# Patient Record
Sex: Female | Born: 1980 | Race: White | Hispanic: No | Marital: Married | Smoking: Current some day smoker
Health system: Southern US, Community
[De-identification: ages and names within clinical notes are randomized; demographics above are authoritative.]

## PROBLEM LIST (undated history)

## (undated) DIAGNOSIS — K029 Dental caries, unspecified: Secondary | ICD-10-CM

## (undated) DIAGNOSIS — C801 Malignant (primary) neoplasm, unspecified: Secondary | ICD-10-CM

## (undated) DIAGNOSIS — C8190 Hodgkin lymphoma, unspecified, unspecified site: Secondary | ICD-10-CM

## (undated) DIAGNOSIS — J302 Other seasonal allergic rhinitis: Secondary | ICD-10-CM

## (undated) DIAGNOSIS — E119 Type 2 diabetes mellitus without complications: Secondary | ICD-10-CM

## (undated) DIAGNOSIS — J45909 Unspecified asthma, uncomplicated: Secondary | ICD-10-CM

## (undated) DIAGNOSIS — I10 Essential (primary) hypertension: Secondary | ICD-10-CM

## (undated) DIAGNOSIS — I160 Hypertensive urgency: Secondary | ICD-10-CM

## (undated) DIAGNOSIS — K219 Gastro-esophageal reflux disease without esophagitis: Secondary | ICD-10-CM

## (undated) DIAGNOSIS — J189 Pneumonia, unspecified organism: Secondary | ICD-10-CM

## (undated) DIAGNOSIS — R51 Headache: Secondary | ICD-10-CM

## (undated) DIAGNOSIS — F329 Major depressive disorder, single episode, unspecified: Secondary | ICD-10-CM

## (undated) DIAGNOSIS — R519 Headache, unspecified: Secondary | ICD-10-CM

## (undated) DIAGNOSIS — F32A Depression, unspecified: Secondary | ICD-10-CM

## (undated) HISTORY — PX: CHOLECYSTECTOMY: SHX55

## (undated) HISTORY — PX: PORTACATH PLACEMENT: SHX2246

## (undated) HISTORY — DX: Hodgkin lymphoma, unspecified, unspecified site: C81.90

## (undated) HISTORY — PX: LYMPH NODE BIOPSY: SHX201

## (undated) HISTORY — PX: PORT-A-CATH REMOVAL: SHX5289

---
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
## 2001-02-14 ENCOUNTER — Encounter: Admission: RE | Admit: 2001-02-14 | Discharge: 2001-02-14 | Payer: Self-pay | Admitting: Obstetrics & Gynecology

## 2001-03-21 ENCOUNTER — Encounter: Admission: RE | Admit: 2001-03-21 | Discharge: 2001-03-21 | Payer: Self-pay | Admitting: *Deleted

## 2001-06-14 ENCOUNTER — Inpatient Hospital Stay (HOSPITAL_COMMUNITY): Admission: AD | Admit: 2001-06-14 | Discharge: 2001-06-14 | Payer: Self-pay | Admitting: *Deleted

## 2002-01-11 ENCOUNTER — Encounter: Payer: Self-pay | Admitting: Emergency Medicine

## 2002-01-11 ENCOUNTER — Emergency Department (HOSPITAL_COMMUNITY): Admission: EM | Admit: 2002-01-11 | Discharge: 2002-01-11 | Payer: Self-pay | Admitting: Emergency Medicine

## 2002-09-17 ENCOUNTER — Emergency Department (HOSPITAL_COMMUNITY): Admission: EM | Admit: 2002-09-17 | Discharge: 2002-09-17 | Payer: Self-pay | Admitting: Emergency Medicine

## 2002-10-18 ENCOUNTER — Encounter: Admission: RE | Admit: 2002-10-18 | Discharge: 2002-10-18 | Payer: Self-pay | Admitting: Obstetrics and Gynecology

## 2002-11-08 ENCOUNTER — Encounter: Admission: RE | Admit: 2002-11-08 | Discharge: 2002-11-08 | Payer: Self-pay | Admitting: Obstetrics and Gynecology

## 2008-12-02 ENCOUNTER — Emergency Department: Payer: Self-pay | Admitting: Emergency Medicine

## 2012-09-28 ENCOUNTER — Emergency Department: Payer: Self-pay | Admitting: Emergency Medicine

## 2012-10-03 DIAGNOSIS — C8190 Hodgkin lymphoma, unspecified, unspecified site: Secondary | ICD-10-CM | POA: Insufficient documentation

## 2012-11-12 ENCOUNTER — Emergency Department: Payer: Self-pay | Admitting: Emergency Medicine

## 2012-11-12 LAB — CBC WITH DIFFERENTIAL/PLATELET
Basophil %: 0.6 %
Eosinophil %: 0.7 %
Lymphocyte #: 2.7 10*3/uL (ref 1.0–3.6)
Monocyte #: 0.8 x10 3/mm (ref 0.2–0.9)
Neutrophil #: 6.7 10*3/uL — ABNORMAL HIGH (ref 1.4–6.5)
RBC: 4.85 10*6/uL (ref 3.80–5.20)
RDW: 13.4 % (ref 11.5–14.5)
WBC: 10.3 10*3/uL (ref 3.6–11.0)

## 2012-11-12 LAB — DRUG SCREEN, URINE
Amphetamines, Ur Screen: NEGATIVE (ref ?–1000)
Cannabinoid 50 Ng, Ur ~~LOC~~: NEGATIVE (ref ?–50)
Cocaine Metabolite,Ur ~~LOC~~: NEGATIVE (ref ?–300)
MDMA (Ecstasy)Ur Screen: NEGATIVE (ref ?–500)
Opiate, Ur Screen: NEGATIVE (ref ?–300)
Phencyclidine (PCP) Ur S: NEGATIVE (ref ?–25)

## 2012-11-12 LAB — CK TOTAL AND CKMB (NOT AT ARMC): CK, Total: 92 U/L (ref 21–215)

## 2012-11-12 LAB — COMPREHENSIVE METABOLIC PANEL
Anion Gap: 6 — ABNORMAL LOW (ref 7–16)
Bilirubin,Total: 0.4 mg/dL (ref 0.2–1.0)
EGFR (Non-African Amer.): >60
Glucose: 123 mg/dL — ABNORMAL HIGH (ref 65–99)
Osmolality: 280 (ref 275–301)
Potassium: 3.8 mmol/L (ref 3.5–5.1)

## 2012-11-12 LAB — URINALYSIS, COMPLETE
Bilirubin,UR: NEGATIVE
Glucose,UR: NEGATIVE mg/dL (ref 0–75)
Leukocyte Esterase: NEGATIVE
Nitrite: NEGATIVE
RBC,UR: 1 /HPF (ref 0–5)
Specific Gravity: 1.01 (ref 1.003–1.030)
Squamous Epithelial: 2
WBC UR: <1 /HPF (ref 0–5)

## 2012-11-12 LAB — PRO B NATRIURETIC PEPTIDE: B-Type Natriuretic Peptide: 791 pg/mL — ABNORMAL HIGH (ref 0–125)

## 2012-11-12 LAB — TROPONIN I: Troponin-I: <0.02 ng/mL

## 2013-01-10 ENCOUNTER — Emergency Department (HOSPITAL_COMMUNITY)
Admission: EM | Admit: 2013-01-10 | Discharge: 2013-01-10 | Discharge status: Against Medical Advice | Payer: Medicaid - Out of State | Attending: Emergency Medicine | Admitting: Emergency Medicine

## 2013-01-10 ENCOUNTER — Encounter (HOSPITAL_COMMUNITY): Payer: Self-pay | Admitting: Emergency Medicine

## 2013-01-10 DIAGNOSIS — R197 Diarrhea, unspecified: Secondary | ICD-10-CM | POA: Insufficient documentation

## 2013-01-10 DIAGNOSIS — I10 Essential (primary) hypertension: Secondary | ICD-10-CM | POA: Insufficient documentation

## 2013-01-10 DIAGNOSIS — R1110 Vomiting, unspecified: Secondary | ICD-10-CM | POA: Insufficient documentation

## 2013-01-10 DIAGNOSIS — Z859 Personal history of malignant neoplasm, unspecified: Secondary | ICD-10-CM | POA: Insufficient documentation

## 2013-01-10 DIAGNOSIS — F17200 Nicotine dependence, unspecified, uncomplicated: Secondary | ICD-10-CM | POA: Insufficient documentation

## 2013-01-10 DIAGNOSIS — E119 Type 2 diabetes mellitus without complications: Secondary | ICD-10-CM | POA: Insufficient documentation

## 2013-01-10 HISTORY — DX: Essential (primary) hypertension: I10

## 2013-01-10 HISTORY — DX: Type 2 diabetes mellitus without complications: E11.9

## 2013-01-10 HISTORY — DX: Malignant (primary) neoplasm, unspecified: C80.1

## 2013-01-10 NOTE — ED Notes (Signed)
Pt took off arm band states waited to long left

## 2013-01-10 NOTE — ED Notes (Signed)
Pt c/o N/V/D starting this am

## 2013-03-14 ENCOUNTER — Ambulatory Visit: Payer: Medicaid - Out of State

## 2013-03-20 ENCOUNTER — Ambulatory Visit: Payer: Medicaid - Out of State

## 2013-04-23 ENCOUNTER — Encounter (HOSPITAL_COMMUNITY): Payer: Self-pay

## 2013-04-24 ENCOUNTER — Ambulatory Visit (HOSPITAL_COMMUNITY): Payer: Self-pay

## 2013-04-26 DIAGNOSIS — E669 Obesity, unspecified: Secondary | ICD-10-CM | POA: Insufficient documentation

## 2013-12-31 ENCOUNTER — Emergency Department (HOSPITAL_COMMUNITY)
Admission: EM | Admit: 2013-12-31 | Discharge: 2013-12-31 | Disposition: A | Discharge status: Home / Self Care | Payer: Medicaid Other | Attending: Emergency Medicine | Admitting: Emergency Medicine

## 2013-12-31 ENCOUNTER — Encounter (HOSPITAL_COMMUNITY): Payer: Self-pay | Admitting: Emergency Medicine

## 2013-12-31 DIAGNOSIS — Z79899 Other long term (current) drug therapy: Secondary | ICD-10-CM | POA: Diagnosis not present

## 2013-12-31 DIAGNOSIS — Y998 Other external cause status: Secondary | ICD-10-CM | POA: Diagnosis not present

## 2013-12-31 DIAGNOSIS — Y92410 Unspecified street and highway as the place of occurrence of the external cause: Secondary | ICD-10-CM | POA: Diagnosis not present

## 2013-12-31 DIAGNOSIS — E119 Type 2 diabetes mellitus without complications: Secondary | ICD-10-CM | POA: Insufficient documentation

## 2013-12-31 DIAGNOSIS — S199XXA Unspecified injury of neck, initial encounter: Secondary | ICD-10-CM | POA: Insufficient documentation

## 2013-12-31 DIAGNOSIS — M542 Cervicalgia: Secondary | ICD-10-CM

## 2013-12-31 DIAGNOSIS — Z7982 Long term (current) use of aspirin: Secondary | ICD-10-CM | POA: Insufficient documentation

## 2013-12-31 DIAGNOSIS — Z91040 Latex allergy status: Secondary | ICD-10-CM | POA: Insufficient documentation

## 2013-12-31 DIAGNOSIS — I10 Essential (primary) hypertension: Secondary | ICD-10-CM | POA: Diagnosis not present

## 2013-12-31 DIAGNOSIS — Y93K1 Activity, walking an animal: Secondary | ICD-10-CM | POA: Diagnosis not present

## 2013-12-31 DIAGNOSIS — Z720 Tobacco use: Secondary | ICD-10-CM | POA: Insufficient documentation

## 2013-12-31 DIAGNOSIS — Z8572 Personal history of non-Hodgkin lymphomas: Secondary | ICD-10-CM | POA: Diagnosis not present

## 2013-12-31 DIAGNOSIS — S4992XA Unspecified injury of left shoulder and upper arm, initial encounter: Secondary | ICD-10-CM | POA: Insufficient documentation

## 2013-12-31 DIAGNOSIS — M25512 Pain in left shoulder: Secondary | ICD-10-CM

## 2013-12-31 MED ORDER — METHOCARBAMOL 500 MG PO TABS: 1000.0000 mg | ORAL_TABLET | Freq: Four times a day (QID) | ORAL | Status: DC | PRN
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
## 2014-08-26 ENCOUNTER — Encounter (HOSPITAL_COMMUNITY): Payer: Self-pay | Admitting: Emergency Medicine

## 2014-08-26 DIAGNOSIS — R1031 Right lower quadrant pain: Secondary | ICD-10-CM | POA: Insufficient documentation

## 2014-08-26 DIAGNOSIS — Z91040 Latex allergy status: Secondary | ICD-10-CM | POA: Insufficient documentation

## 2014-08-26 DIAGNOSIS — R110 Nausea: Secondary | ICD-10-CM | POA: Insufficient documentation

## 2014-08-26 DIAGNOSIS — Z79899 Other long term (current) drug therapy: Secondary | ICD-10-CM | POA: Insufficient documentation

## 2014-08-26 DIAGNOSIS — Z720 Tobacco use: Secondary | ICD-10-CM | POA: Insufficient documentation

## 2014-08-26 DIAGNOSIS — Z3202 Encounter for pregnancy test, result negative: Secondary | ICD-10-CM | POA: Insufficient documentation

## 2014-08-26 DIAGNOSIS — E119 Type 2 diabetes mellitus without complications: Secondary | ICD-10-CM | POA: Insufficient documentation

## 2014-08-26 DIAGNOSIS — R197 Diarrhea, unspecified: Secondary | ICD-10-CM | POA: Insufficient documentation

## 2014-08-26 DIAGNOSIS — I10 Essential (primary) hypertension: Secondary | ICD-10-CM | POA: Insufficient documentation

## 2014-08-26 DIAGNOSIS — Z8589 Personal history of malignant neoplasm of other organs and systems: Secondary | ICD-10-CM | POA: Insufficient documentation

## 2014-08-26 NOTE — ED Notes (Signed)
Pt. reports RLQ pain radiating to right lower back , nausea and diarrhea onset today , denies emesis / no fever or chills.

## 2014-08-27 ENCOUNTER — Encounter (HOSPITAL_COMMUNITY): Payer: Self-pay

## 2014-08-27 ENCOUNTER — Emergency Department (HOSPITAL_COMMUNITY)
Admission: EM | Admit: 2014-08-27 | Discharge: 2014-08-27 | Disposition: A | Discharge status: Home / Self Care | Payer: Self-pay | Attending: Emergency Medicine | Admitting: Emergency Medicine

## 2014-08-27 ENCOUNTER — Emergency Department (HOSPITAL_COMMUNITY): Payer: Medicaid Other

## 2014-08-27 DIAGNOSIS — R1031 Right lower quadrant pain: Secondary | ICD-10-CM

## 2014-08-27 LAB — URINALYSIS, ROUTINE W REFLEX MICROSCOPIC
Glucose, UA: 500 mg/dL — AB
Ketones, ur: 15 mg/dL — AB
Leukocytes, UA: NEGATIVE
NITRITE: NEGATIVE
PROTEIN: 100 mg/dL — AB
Specific Gravity, Urine: 1.031 — ABNORMAL HIGH (ref 1.005–1.030)
Urobilinogen, UA: 1 mg/dL (ref 0.0–1.0)
pH: 6 (ref 5.0–8.0)

## 2014-08-27 LAB — WET PREP, GENITAL
CLUE CELLS WET PREP: NONE SEEN
TRICH WET PREP: NONE SEEN
Yeast Wet Prep HPF POC: NONE SEEN

## 2014-08-27 LAB — COMPREHENSIVE METABOLIC PANEL
ALK PHOS: 52 U/L (ref 38–126)
ALT: 17 U/L (ref 14–54)
AST: 14 U/L — AB (ref 15–41)
Albumin: 3.9 g/dL (ref 3.5–5.0)
Anion gap: 7 (ref 5–15)
BUN: 8 mg/dL (ref 6–20)
CO2: 26 mmol/L (ref 22–32)
CREATININE: 0.62 mg/dL (ref 0.44–1.00)
Calcium: 9.4 mg/dL (ref 8.9–10.3)
Chloride: 103 mmol/L (ref 101–111)
GFR calc Af Amer: >60 mL/min (ref >60)
GFR calc non Af Amer: >60 mL/min (ref >60)
Glucose, Bld: 210 mg/dL — ABNORMAL HIGH (ref 65–99)
POTASSIUM: 3.9 mmol/L (ref 3.5–5.1)
SODIUM: 136 mmol/L (ref 135–145)
Total Bilirubin: 0.7 mg/dL (ref 0.3–1.2)
Total Protein: 6.9 g/dL (ref 6.5–8.1)

## 2014-08-27 LAB — CBC
HCT: 46.4 % — ABNORMAL HIGH (ref 36.0–46.0)
HEMOGLOBIN: 16.3 g/dL — AB (ref 12.0–15.0)
MCH: 32.5 pg (ref 26.0–34.0)
MCHC: 35.1 g/dL (ref 30.0–36.0)
MCV: 92.6 fL (ref 78.0–100.0)
Platelets: 255 10*3/uL (ref 150–400)
RBC: 5.01 MIL/uL (ref 3.87–5.11)
RDW: 13.5 % (ref 11.5–15.5)
WBC: 15.6 10*3/uL — ABNORMAL HIGH (ref 4.0–10.5)

## 2014-08-27 LAB — POC URINE PREG, ED: PREG TEST UR: NEGATIVE

## 2014-08-27 LAB — GC/CHLAMYDIA PROBE AMP (~~LOC~~) NOT AT ARMC
CHLAMYDIA, DNA PROBE: NEGATIVE
Neisseria Gonorrhea: NEGATIVE

## 2014-08-27 LAB — URINE MICROSCOPIC-ADD ON

## 2014-08-27 LAB — LIPASE, BLOOD: LIPASE: 16 U/L — AB (ref 22–51)

## 2014-08-27 MED ORDER — ONDANSETRON HCL 4 MG/2ML IJ SOLN
4.0000 mg | Freq: Once | INTRAMUSCULAR | Status: AC
  Administered 2014-08-27: 4 mg via INTRAVENOUS
  Filled 2014-08-27: qty 2

## 2014-08-27 MED ORDER — IOHEXOL 300 MG/ML  SOLN
80.0000 mL | Freq: Once | INTRAMUSCULAR | Status: AC | PRN
  Administered 2014-08-27: 80 mL via INTRAVENOUS

## 2014-08-27 MED ORDER — ONDANSETRON 8 MG PO TBDP: 8.0000 mg | ORAL_TABLET | Freq: Three times a day (TID) | ORAL | Status: DC | PRN

## 2014-08-27 MED ORDER — HYDROCODONE-ACETAMINOPHEN 5-325 MG PO TABS: 1.0000 | ORAL_TABLET | ORAL | Status: DC | PRN

## 2014-08-27 MED ORDER — DICYCLOMINE HCL 20 MG PO TABS: 20.0000 mg | ORAL_TABLET | Freq: Four times a day (QID) | ORAL | Status: DC | PRN

## 2014-08-27 MED ORDER — MORPHINE SULFATE 4 MG/ML IJ SOLN
4.0000 mg | Freq: Once | INTRAMUSCULAR | Status: AC
  Administered 2014-08-27: 4 mg via INTRAVENOUS
  Filled 2014-08-27: qty 1

## 2014-08-27 MED ORDER — IOHEXOL 300 MG/ML  SOLN
25.0000 mL | Freq: Once | INTRAMUSCULAR | Status: AC | PRN
  Administered 2014-08-27: 25 mL via INTRAVENOUS

## 2014-08-27 NOTE — ED Provider Notes (Signed)
CSN: [id number]     Arrival date & time 08/26/14  2338 History  This chart was scribed for Linton Flemings, MD by Hansel Feinstein, ED Scribe. This patient was seen in room B19C/B19C and the patient's care was started at 12:30 AM.     Chief Complaint  Patient presents with  . Abdominal Pain   The history is provided by the patient. No language interpreter was used.    HPI Comments: Bridget Scott is a 34 y.o. 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 She states associated nausea and diarrhea. LNMP was 2 months ago. 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 She denies Hx of renal calculi, sick contact. She also denies vomiting, dysuria, vaginal discharge.  Past Medical History  Diagnosis Date  . Hypertension   . Diabetes mellitus without complication   . Cancer     lymphoma   No past surgical history on file. No family history on file. History  Substance Use Topics  . Smoking status: Current Every Day Smoker  . Smokeless tobacco: Not on file  . Alcohol Use: No   OB History    No data available     Review of Systems  Gastrointestinal: Positive for nausea, abdominal pain and diarrhea. Negative for vomiting.  Genitourinary: Negative for dysuria and vaginal discharge.  All other systems reviewed and are negative.   Allergies  Latex and Robitussin (alcohol free)  Home Medications   Prior to Admission medications   Medication Sig Start Date End Date Taking? Authorizing Provider  aspirin-acetaminophen-caffeine (EXCEDRIN MIGRAINE) [phone] MG per tablet Take 1 tablet by mouth every 6 (six) hours as needed for headache.    Historical Provider, MD  benazepril (LOTENSIN) 10 MG tablet Take 10 mg by mouth daily.    Historical Provider, MD  metFORMIN (GLUCOPHAGE) 500 MG tablet Take 500  mg by mouth 3 (three) times daily.    Historical Provider, MD  methocarbamol (ROBAXIN) 500 MG tablet Take 2 tablets (1,000 mg total) by mouth 4 (four) times daily as needed (Pain). 12/31/13   Nicole Pisciotta, [name]   BP 194/107 mmHg  Pulse 89  Temp(Src) 98.1 F (36.7 C) (Oral)  Resp 18  Ht 5\' 5"  (1.651 m)  Wt 214 lb 7 oz (97.268 kg)  BMI 35.68 kg/m2  SpO2 98%  LMP  Physical Exam  Constitutional: She is oriented to person, place, and time. She appears well-developed and well-nourished.  HENT:  Head: Normocephalic and atraumatic.  Nose: Nose normal.  Mouth/Throat: Oropharynx is clear and moist.  Eyes: Conjunctivae and EOM are normal. Pupils are equal, round, and reactive to light.  Neck: Normal range of motion. Neck supple. No JVD present. No tracheal deviation present. No thyromegaly present.  Cardiovascular: Normal rate, regular rhythm, normal heart sounds and intact distal pulses.  Exam reveals no gallop and no friction rub.   No murmur heard. Pulmonary/Chest: Effort normal and breath sounds normal. No stridor. No respiratory distress. She has no wheezes. She has no rales. She exhibits no tenderness.  Abdominal: Soft. Bowel sounds are normal. She exhibits no distension and no mass. There is tenderness (Tenderness with palpation in right lower quadrant without rebound or guarding). There is no rebound and no guarding.  Genitourinary:  External genitalia within normal limits Vagina without discharge Cervix  normal negative for cervical  motion tenderness Adnexa palpated, no masses or negative for tenderness noted Bladder palpated negative for tenderness Uterus palpated no masses or negative for tenderness    Musculoskeletal: Normal range of motion. She exhibits no edema or tenderness.  Lymphadenopathy:    She has no cervical adenopathy.  Neurological: She is alert and oriented to person, place, and time. She displays normal reflexes. She exhibits normal muscle tone. Coordination  normal.  Skin: Skin is warm and dry. No rash noted. No erythema. No pallor.  Psychiatric: She has a normal mood and affect. Her behavior is normal. Judgment and thought content normal.  Nursing note and vitals reviewed.   ED Course  Procedures (including critical care time) DIAGNOSTIC STUDIES: Oxygen Saturation is 98% on RA, normal by my interpretation.    COORDINATION OF CARE: 12:35 AM Discussed treatment plan with pt at bedside and pt agreed to plan.   Labs Review Labs Reviewed  WET PREP, GENITAL - Abnormal; Notable for the following:    WBC, Wet Prep HPF POC FEW (*)    All other components within normal limits  LIPASE, BLOOD - Abnormal; Notable for the following:    Lipase 16 (*)    All other components within normal limits  COMPREHENSIVE METABOLIC PANEL - Abnormal; Notable for the following:    Glucose, Bld 210 (*)    AST 14 (*)    All other components within normal limits  CBC - Abnormal; Notable for the following:    WBC 15.6 (*)    Hemoglobin 16.3 (*)    HCT 46.4 (*)    All other components within normal limits  URINALYSIS, ROUTINE W REFLEX MICROSCOPIC (NOT AT [facility]) - Abnormal; Notable for the following:    Color, Urine AMBER (*)    APPearance CLOUDY (*)    Specific Gravity, Urine 1.031 (*)    Glucose, UA 500 (*)    Hgb urine dipstick TRACE (*)    Bilirubin Urine SMALL (*)    Ketones, ur 15 (*)    Protein, ur 100 (*)    All other components within normal limits  URINE MICROSCOPIC-ADD ON - Abnormal; Notable for the following:    Squamous Epithelial / LPF MANY (*)    Bacteria, UA MANY (*)    All other components within normal limits  POC URINE PREG, ED  GC/CHLAMYDIA PROBE AMP ([place]) NOT AT [facility]    Imaging Review Ct Abdomen Pelvis W Contrast  08/27/2014   CLINICAL DATA:  Acute onset of right lower quadrant abdominal pain. Current history of Hodgkin's lymphoma. Initial encounter.  EXAM: CT ABDOMEN AND PELVIS WITH CONTRAST  TECHNIQUE: Multidetector CT  imaging of the abdomen and pelvis was performed using the standard protocol following bolus administration of intravenous contrast.  CONTRAST:  55mL OMNIPAQUE IOHEXOL 300 MG/ML  SOLN  COMPARISON:  None.  FINDINGS: The visualized lung bases are clear.  The liver and spleen are unremarkable in appearance. The patient is status post cholecystectomy, with clips noted at the gallbladder fossa. The pancreas and adrenal glands are unremarkable.  The kidneys are unremarkable in appearance. There is no evidence of hydronephrosis. No renal or ureteral stones are seen. No perinephric stranding is appreciated.  No free fluid is identified. The small bowel is unremarkable in appearance. The stomach is within normal limits. No acute vascular abnormalities are seen.  The appendix is normal in caliber, without evidence of appendicitis. The colon is unremarkable in appearance.  The bladder is mildly distended and grossly unremarkable. The uterus  is within normal limits. The ovaries are grossly symmetric. No suspicious adnexal masses are seen. No inguinal lymphadenopathy is seen.  No acute osseous abnormalities are identified.  IMPRESSION: No acute abnormality seen within the abdomen or pelvis.   Electronically Signed   By: Garald Balding M.D.   On: 08/27/2014 02:38     EKG Interpretation None      MDM   Final diagnoses:  Right lower quadrant abdominal pain   I personally performed the services described in this documentation, which was scribed in my presence. The recorded information has been reviewed and is accurate.  34 year old female with right lower quadrant pain today.  Workup here without specific findings.  Pelvic exam normal.  CT scan without appendicitis.  No signs of urinary tract infection.  She has leukocytosis without focal infection.  Patient told to stick to bland diet and return for worsening symptoms.  Linton Flemings, MD 08/27/14 [phone]

## 2014-08-27 NOTE — Discharge Instructions (Signed)
Your CAT scan today did not show a specific cause for your pain.  Stick to a bland diet.  Return to the emergency department for worsening condition or new concerning symptoms.  Take medications as prescribed.  Follow-up with your Dr. For recheck this week.    Abdominal Pain Many things can cause abdominal pain. Usually, abdominal pain is not caused by a disease and will improve without treatment. It can often be observed and treated at home. Your health care provider will do a physical exam and possibly order blood tests and X-rays to help determine the seriousness of your pain. However, in many cases, more time must pass before a clear cause of the pain can be found. Before that point, your health care provider may not know if you need more testing or further treatment. HOME CARE INSTRUCTIONS  Monitor your abdominal pain for any changes. The following actions may help to alleviate any discomfort you are experiencing:  Only take over-the-counter or prescription medicines as directed by your health care provider.  Do not take laxatives unless directed to do so by your health care provider.  Try a clear liquid diet (broth, tea, or water) as directed by your health care provider. Slowly move to a bland diet as tolerated. SEEK MEDICAL CARE IF:  You have unexplained abdominal pain.  You have abdominal pain associated with nausea or diarrhea.  You have pain when you urinate or have a bowel movement.  You experience abdominal pain that wakes you in the night.  You have abdominal pain that is worsened or improved by eating food.  You have abdominal pain that is worsened with eating fatty foods.  You have a fever. SEEK IMMEDIATE MEDICAL CARE IF:   Your pain does not go away within 2 hours.  You keep throwing up (vomiting).  Your pain is felt only in portions of the abdomen, such as the right side or the left lower portion of the abdomen.  You pass bloody or black tarry stools. MAKE  SURE YOU:  Understand these instructions.   Will watch your condition.   Will get help right away if you are not doing well or get worse.  Document Released: 10/21/2004 Document Revised: 01/16/2013 Document Reviewed: 09/20/2012 [facility] Patient Information 2015 [place], [place]. This information is not intended to replace advice given to you by your health care provider. Make sure you discuss any questions you have with your health care provider.

## 2015-02-21 ENCOUNTER — Emergency Department (HOSPITAL_COMMUNITY)
Admission: EM | Admit: 2015-02-21 | Discharge: 2015-02-21 | Disposition: A | Discharge status: Home / Self Care | Payer: Medicaid Other | Attending: Emergency Medicine | Admitting: Emergency Medicine

## 2015-02-21 ENCOUNTER — Encounter (HOSPITAL_COMMUNITY): Payer: Self-pay | Admitting: Family Medicine

## 2015-02-21 DIAGNOSIS — I10 Essential (primary) hypertension: Secondary | ICD-10-CM | POA: Insufficient documentation

## 2015-02-21 DIAGNOSIS — Z7984 Long term (current) use of oral hypoglycemic drugs: Secondary | ICD-10-CM | POA: Diagnosis not present

## 2015-02-21 DIAGNOSIS — K0889 Other specified disorders of teeth and supporting structures: Secondary | ICD-10-CM | POA: Diagnosis present

## 2015-02-21 DIAGNOSIS — Z91040 Latex allergy status: Secondary | ICD-10-CM | POA: Diagnosis not present

## 2015-02-21 DIAGNOSIS — F17200 Nicotine dependence, unspecified, uncomplicated: Secondary | ICD-10-CM | POA: Insufficient documentation

## 2015-02-21 DIAGNOSIS — Z8572 Personal history of non-Hodgkin lymphomas: Secondary | ICD-10-CM | POA: Diagnosis not present

## 2015-02-21 DIAGNOSIS — G8929 Other chronic pain: Secondary | ICD-10-CM | POA: Insufficient documentation

## 2015-02-21 DIAGNOSIS — K002 Abnormalities of size and form of teeth: Secondary | ICD-10-CM | POA: Insufficient documentation

## 2015-02-21 DIAGNOSIS — E119 Type 2 diabetes mellitus without complications: Secondary | ICD-10-CM | POA: Insufficient documentation

## 2015-02-21 DIAGNOSIS — K029 Dental caries, unspecified: Secondary | ICD-10-CM | POA: Insufficient documentation

## 2015-02-21 DIAGNOSIS — Z792 Long term (current) use of antibiotics: Secondary | ICD-10-CM | POA: Insufficient documentation

## 2015-02-21 DIAGNOSIS — Z79899 Other long term (current) drug therapy: Secondary | ICD-10-CM | POA: Diagnosis not present

## 2015-02-21 DIAGNOSIS — K089 Disorder of teeth and supporting structures, unspecified: Secondary | ICD-10-CM

## 2015-02-21 MED ORDER — BUPIVACAINE HCL (PF) 0.5 % IJ SOLN
10.0000 mL | Freq: Once | INTRAMUSCULAR | Status: AC
  Administered 2015-02-21: 10 mL
  Filled 2015-02-21: qty 10

## 2015-02-21 MED ORDER — IBUPROFEN 600 MG PO TABS: 600.0000 mg | ORAL_TABLET | Freq: Four times a day (QID) | ORAL | Status: DC | PRN

## 2015-02-21 NOTE — ED Notes (Addendum)
Pt here for multiple broken top teeth and infection. Pt currently taking abx. sts worsening.

## 2015-02-21 NOTE — Discharge Instructions (Signed)
Dental Care and Dentist Visits °Dental care supports good overall health. Regular dental visits can also help you avoid dental pain, bleeding, infection, and other more serious health problems in the future. It is important to keep the mouth healthy because diseases in the teeth, gums, and other oral tissues can spread to other areas of the body. Some problems, such as diabetes, heart disease, and pre-term labor have been associated with poor oral health.  °See your dentist every 6 months. If you experience emergency problems such as a toothache or broken tooth, go to the dentist right away. If you see your dentist regularly, you may catch problems early. It is easier to be treated for problems in the early stages.  °WHAT TO EXPECT AT A DENTIST VISIT  °Your dentist will look for many common oral health problems and recommend proper treatment. At your regular dental visit, you can expect: °· Gentle cleaning of the teeth and gums. This includes scraping and polishing. This helps to remove the sticky substance around the teeth and gums (plaque). Plaque forms in the mouth shortly after eating. Over time, plaque hardens on the teeth as tartar. If tartar is not removed regularly, it can cause problems. Cleaning also helps remove stains. °· Periodic X-rays. These pictures of the teeth and supporting bone will help your dentist assess the health of your teeth. °· Periodic fluoride treatments. Fluoride is a natural mineral shown to help strengthen teeth. Fluoride treatment involves applying a fluoride gel or varnish to the teeth. It is most commonly done in children. °· Examination of the mouth, tongue, jaws, teeth, and gums to look for any oral health problems, such as: °¨ Cavities (dental caries). This is decay on the tooth caused by plaque, sugar, and acid in the mouth. It is best to catch a cavity when it is small. °¨ Inflammation of the gums caused by plaque buildup (gingivitis). °¨ Problems with the mouth or malformed  or misaligned teeth. °¨ Oral cancer or other diseases of the soft tissues or jaws.  °KEEP YOUR TEETH AND GUMS HEALTHY °For healthy teeth and gums, follow these general guidelines as well as your dentist's specific advice: °· Have your teeth professionally cleaned at the dentist every 6 months. °· Brush twice daily with a fluoride toothpaste. °· Floss your teeth daily.  °· Ask your dentist if you need fluoride supplements, treatments, or fluoride toothpaste. °· Eat a healthy diet. Reduce foods and drinks with added sugar. °· Avoid smoking. °TREATMENT FOR ORAL HEALTH PROBLEMS °If you have oral health problems, treatment varies depending on the conditions present in your teeth and gums. °· Your caregiver will most likely recommend good oral hygiene at each visit. °· For cavities, gingivitis, or other oral health disease, your caregiver will perform a procedure to treat the problem. This is typically done at a separate appointment. Sometimes your caregiver will refer you to another dental specialist for specific tooth problems or for surgery. °SEEK IMMEDIATE DENTAL CARE IF: °· You have pain, bleeding, or soreness in the gum, tooth, jaw, or mouth area. °· A permanent tooth becomes loose or separated from the gum socket. °· You experience a blow or injury to the mouth or jaw area. °  °This information is not intended to replace advice given to you by your health care provider. Make sure you discuss any questions you have with your health care provider. °  °Document Released: 09/23/2010 Document Revised: 04/05/2011 Document Reviewed: 09/23/2010 °Elsevier Interactive Patient Education ©2016 [employer] ° °

## 2015-02-21 NOTE — ED Provider Notes (Signed)
CSN: [id number]     Arrival date & time 02/21/15  1842 History   First MD Initiated Contact with Patient 02/21/15 2039     Chief Complaint  Patient presents with  . Dental Pain     (Consider location/radiation/quality/duration/timing/severity/associated sxs/prior Treatment) Patient is a 35 y.o. female presenting with tooth pain. The history is provided by the patient.  Dental Pain Location:  Generalized (worse at front left upper teeth) Quality:  Shooting and throbbing Severity:  Moderate Onset quality:  Gradual Duration: months but worse over the past 5 days. Timing:  Constant Progression:  Worsening Chronicity:  Chronic Context: dental caries and poor dentition   Relieved by:  Nothing Worsened by:  Touching Ineffective treatments:  None tried Associated symptoms: facial pain   Associated symptoms: no difficulty swallowing, no drooling, no facial swelling, no fever, no gum swelling, no headaches and no neck swelling   Risk factors: lack of dental care and smoking     Past Medical History  Diagnosis Date  . Hypertension   . Diabetes mellitus without complication ([place])   . Cancer [facility])     lymphoma   History reviewed. No pertinent past surgical history. History reviewed. No pertinent family history. Social History  Substance Use Topics  . Smoking status: Current Every Day Smoker  . Smokeless tobacco: None  . Alcohol Use: No   OB History    No data available     Review of Systems  Constitutional: Negative for fever.  HENT: Negative for drooling and facial swelling.   Eyes: Negative for redness and visual disturbance.  Respiratory: Negative for cough, shortness of breath and wheezing.   Cardiovascular: Negative.   Gastrointestinal: Negative.   Genitourinary: Negative.   Musculoskeletal: Negative.   Skin: Negative.   Neurological: Negative for headaches.      Allergies  Latex and Robitussin (alcohol free)  Home Medications   Prior to Admission  medications   Medication Sig Start Date End Date Taking? Authorizing Provider  amoxicillin (AMOXIL) 500 MG capsule Take 500 mg by mouth 3 (three) times daily.   Yes Historical Provider, MD  metFORMIN (GLUCOPHAGE) 500 MG tablet Take 500 mg by mouth 3 (three) times daily as needed (Take 500mg  with breakfast, 1000mg  with lunch, and 500mg  with dinner).   Yes Historical Provider, MD  methyldopa (ALDOMET) 500 MG tablet Take 500 mg by mouth 2 (two) times daily. 01/01/15 01/01/16 Yes Historical Provider, MD  traMADol (ULTRAM) 50 MG tablet Take 50 mg by mouth every 6 (six) hours as needed. For pain 01/01/15 01/01/16 Yes Historical Provider, MD  traZODone (DESYREL) 50 MG tablet Take 50 mg by mouth at bedtime. 01/01/15  Yes Historical Provider, MD  dicyclomine (BENTYL) 20 MG tablet Take 1 tablet (20 mg total) by mouth every 6 (six) hours as needed for spasms (for abdominal cramping). 08/27/14   Linton Flemings, MD  HYDROcodone-acetaminophen (NORCO/VICODIN) 5-325 MG per tablet Take 1-2 tablets by mouth every 4 (four) hours as needed. 08/27/14   Linton Flemings, MD  ibuprofen (ADVIL,MOTRIN) 600 MG tablet Take 1 tablet (600 mg total) by mouth every 6 (six) hours as needed. 02/21/15   Heriberto Antigua, MD  methocarbamol (ROBAXIN) 500 MG tablet Take 2 tablets (1,000 mg total) by mouth 4 (four) times daily as needed (Pain). Patient not taking: Reported on 08/27/2014 12/31/13   Elmyra Ricks Pisciotta, [name]  ondansetron (ZOFRAN ODT) 8 MG disintegrating tablet Take 1 tablet (8 mg total) by mouth every 8 (eight) hours as needed for nausea or  vomiting. 08/27/14   Linton Flemings, MD   BP 117/38 mmHg  Pulse 86  Temp(Src) 98 F (36.7 C) (Oral)  Resp 18  SpO2 99% Physical Exam  Constitutional: She is oriented to person, place, and time. She appears well-developed and well-nourished. No distress.  HENT:  Head: Normocephalic and atraumatic.  Mouth/Throat: Oropharynx is clear and moist. No oral lesions. Abnormal dentition (and very poor dentition ). Dental  caries present. No uvula swelling or lacerations.  No obvious facial swelling, oral abscess, or erythema  Eyes: Pupils are equal, round, and reactive to light. No scleral icterus.  Neck: Normal range of motion. Neck supple. No tracheal deviation present.  Cardiovascular: Normal rate, regular rhythm, normal heart sounds and intact distal pulses.   Pulmonary/Chest: Effort normal and breath sounds normal. No stridor. She exhibits no tenderness.  Abdominal: Soft. She exhibits no distension. There is no tenderness. There is no rebound.  Musculoskeletal: Normal range of motion. She exhibits no edema or tenderness.  Lymphadenopathy:    She has no cervical adenopathy.  Neurological: She is alert and oriented to person, place, and time. No cranial nerve deficit. Coordination normal.  Skin: Skin is warm and dry. No rash noted. She is not diaphoretic. No erythema. No pallor.  Psychiatric: She has a normal mood and affect.  Nursing note and vitals reviewed.   ED Course  Procedures (including critical care time) Labs Review Labs Reviewed - No data to display  Imaging Review No results found. I have personally reviewed and evaluated these images and lab results as part of my medical decision-making.   EKG Interpretation None      MDM   Final diagnoses:  Pain, dental  Poor dentition    Patient is a 34 year old female who presents today with left-sided upper dental pain. She has very poor dentition reportedly after chemotherapy and she has been on amoxicillin by an outside provider for the past week and a half. She comes in today with 4 days of worsening tooth pain but otherwise denies fevers, chills, or significant neck or facial swelling. There is nothing draining. Further history and exam as above notable for stable vital signs. Doubt dental abscess, peritonsillar abscess, or significant complication at this time.  Patient initially agreed to a dental block but refused prior to injecting  with the needle 2/2 her intense fear of needles.  Patient stable for discharge home. Advised to f/u with dentistry on Monday. Advised to use ibuprofen and tylenol. Patient agrees to stated plan. All questions answered. Advised to call or return to have any questions, new symptoms, change in symptoms, or symptoms that they do not understand.     Heriberto Antigua, MD 02/22/15 [place], MD 02/25/15 [phone]

## 2015-03-18 ENCOUNTER — Emergency Department: Payer: Medicaid Other

## 2015-03-18 ENCOUNTER — Encounter: Payer: Self-pay | Admitting: Emergency Medicine

## 2015-03-18 ENCOUNTER — Emergency Department
Admission: EM | Admit: 2015-03-18 | Discharge: 2015-03-18 | Disposition: A | Discharge status: Home / Self Care | Payer: Medicaid Other | Attending: Emergency Medicine | Admitting: Emergency Medicine

## 2015-03-18 DIAGNOSIS — Y9289 Other specified places as the place of occurrence of the external cause: Secondary | ICD-10-CM | POA: Insufficient documentation

## 2015-03-18 DIAGNOSIS — F17200 Nicotine dependence, unspecified, uncomplicated: Secondary | ICD-10-CM | POA: Insufficient documentation

## 2015-03-18 DIAGNOSIS — Z792 Long term (current) use of antibiotics: Secondary | ICD-10-CM | POA: Diagnosis not present

## 2015-03-18 DIAGNOSIS — Y998 Other external cause status: Secondary | ICD-10-CM | POA: Insufficient documentation

## 2015-03-18 DIAGNOSIS — R55 Syncope and collapse: Secondary | ICD-10-CM

## 2015-03-18 DIAGNOSIS — Z79899 Other long term (current) drug therapy: Secondary | ICD-10-CM | POA: Insufficient documentation

## 2015-03-18 DIAGNOSIS — S0083XA Contusion of other part of head, initial encounter: Secondary | ICD-10-CM | POA: Diagnosis not present

## 2015-03-18 DIAGNOSIS — S199XXA Unspecified injury of neck, initial encounter: Secondary | ICD-10-CM | POA: Diagnosis not present

## 2015-03-18 DIAGNOSIS — Z91040 Latex allergy status: Secondary | ICD-10-CM | POA: Diagnosis not present

## 2015-03-18 DIAGNOSIS — Z7984 Long term (current) use of oral hypoglycemic drugs: Secondary | ICD-10-CM | POA: Diagnosis not present

## 2015-03-18 DIAGNOSIS — I10 Essential (primary) hypertension: Secondary | ICD-10-CM | POA: Insufficient documentation

## 2015-03-18 DIAGNOSIS — E119 Type 2 diabetes mellitus without complications: Secondary | ICD-10-CM | POA: Insufficient documentation

## 2015-03-18 DIAGNOSIS — Y9389 Activity, other specified: Secondary | ICD-10-CM | POA: Diagnosis not present

## 2015-03-18 DIAGNOSIS — R079 Chest pain, unspecified: Secondary | ICD-10-CM | POA: Insufficient documentation

## 2015-03-18 DIAGNOSIS — Z3202 Encounter for pregnancy test, result negative: Secondary | ICD-10-CM | POA: Diagnosis not present

## 2015-03-18 DIAGNOSIS — W01198A Fall on same level from slipping, tripping and stumbling with subsequent striking against other object, initial encounter: Secondary | ICD-10-CM | POA: Diagnosis not present

## 2015-03-18 LAB — BASIC METABOLIC PANEL
ANION GAP: 10 (ref 5–15)
BUN: 13 mg/dL (ref 6–20)
CALCIUM: 9 mg/dL (ref 8.9–10.3)
CO2: 19 mmol/L — ABNORMAL LOW (ref 22–32)
Chloride: 107 mmol/L (ref 101–111)
Creatinine, Ser: 0.62 mg/dL (ref 0.44–1.00)
GFR calc Af Amer: >60 mL/min (ref >60)
Glucose, Bld: 188 mg/dL — ABNORMAL HIGH (ref 65–99)
Potassium: 3.9 mmol/L (ref 3.5–5.1)
Sodium: 136 mmol/L (ref 135–145)

## 2015-03-18 LAB — URINALYSIS COMPLETE WITH MICROSCOPIC (ARMC ONLY)
BILIRUBIN URINE: NEGATIVE
Bacteria, UA: NONE SEEN
GLUCOSE, UA: 50 mg/dL — AB
Hgb urine dipstick: NEGATIVE
Leukocytes, UA: NEGATIVE
NITRITE: NEGATIVE
Protein, ur: 100 mg/dL — AB
SPECIFIC GRAVITY, URINE: 1.035 — AB (ref 1.005–1.030)
pH: 5 (ref 5.0–8.0)

## 2015-03-18 LAB — CBC
HCT: 50 % — ABNORMAL HIGH (ref 35.0–47.0)
HEMOGLOBIN: 16.6 g/dL — AB (ref 12.0–16.0)
MCH: 30.3 pg (ref 26.0–34.0)
MCHC: 33.2 g/dL (ref 32.0–36.0)
MCV: 91.4 fL (ref 80.0–100.0)
Platelets: 214 10*3/uL (ref 150–440)
RBC: 5.48 MIL/uL — ABNORMAL HIGH (ref 3.80–5.20)
RDW: 13.7 % (ref 11.5–14.5)
WBC: 12.6 10*3/uL — ABNORMAL HIGH (ref 3.6–11.0)

## 2015-03-18 LAB — TROPONIN I
Troponin I: 0.05 ng/mL — ABNORMAL HIGH (ref <0.031)
Troponin I: 0.04 ng/mL — ABNORMAL HIGH (ref <0.031)

## 2015-03-18 LAB — URINE DRUG SCREEN, QUALITATIVE (ARMC ONLY)
AMPHETAMINES, UR SCREEN: NOT DETECTED
Barbiturates, Ur Screen: NOT DETECTED
Benzodiazepine, Ur Scrn: NOT DETECTED
Cannabinoid 50 Ng, Ur ~~LOC~~: NOT DETECTED
Cocaine Metabolite,Ur ~~LOC~~: NOT DETECTED
MDMA (Ecstasy)Ur Screen: NOT DETECTED
METHADONE SCREEN, URINE: NOT DETECTED
OPIATE, UR SCREEN: NOT DETECTED
Phencyclidine (PCP) Ur S: NOT DETECTED
Tricyclic, Ur Screen: NOT DETECTED

## 2015-03-18 LAB — POCT PREGNANCY, URINE: Preg Test, Ur: NEGATIVE

## 2015-03-18 MED ORDER — SODIUM CHLORIDE 0.9 % IV BOLUS (SEPSIS)
1000.0000 mL | Freq: Once | INTRAVENOUS | Status: AC
  Administered 2015-03-18: 1000 mL via INTRAVENOUS

## 2015-03-18 MED ORDER — IOHEXOL 350 MG/ML SOLN
100.0000 mL | Freq: Once | INTRAVENOUS | Status: AC | PRN
  Administered 2015-03-18: 100 mL via INTRAVENOUS
  Filled 2015-03-18: qty 100

## 2015-03-18 MED ORDER — ASPIRIN 81 MG PO CHEW
324.0000 mg | CHEWABLE_TABLET | Freq: Once | ORAL | Status: AC
  Administered 2015-03-18: 324 mg via ORAL
  Filled 2015-03-18: qty 4

## 2015-03-18 NOTE — ED Provider Notes (Signed)
[facility] Emergency Department Provider Note  Time seen: 7:26 PM  I have reviewed the triage vital signs and the nursing notes.   HISTORY  Chief Complaint Loss of Consciousness; Dizziness; and Chest Pain    HPI Bridget Scott is a 35 y.o. female with a past medical history of diabetes, hypertension, lymphoma status post chemotherapy in remission, who presents the emergency department after 3 syncopal episodes. According to the patient last night around 2 AM she had a syncopal episode, states she felt somewhat better today but is since had to further syncopal episodes today. States she is also been experiencing intermittent chest pain which she describes as a punching sensation to the center of her chest. Denies any nausea, shortness of breath or diaphoresis. Patient states she has passed out in the past but it is been 2 or 3 years, and she has never passed out multiple times in a day. Denies any abdominal pain, nausea, vomiting, diarrhea. Denies any dysuria. Denies any chest discomfort currently.     Past Medical History  Diagnosis Date  . Hypertension   . Diabetes mellitus without complication ([place])   . Cancer [facility])     lymphoma    There are no active problems to display for this patient.   History reviewed. No pertinent past surgical history.  Current Outpatient Rx  Name  Route  Sig  Dispense  Refill  . amoxicillin (AMOXIL) 500 MG capsule   Oral   Take 500 mg by mouth 3 (three) times daily.         Marland Kitchen dicyclomine (BENTYL) 20 MG tablet   Oral   Take 1 tablet (20 mg total) by mouth every 6 (six) hours as needed for spasms (for abdominal cramping).   20 tablet   0   . HYDROcodone-acetaminophen (NORCO/VICODIN) 5-325 MG per tablet   Oral   Take 1-2 tablets by mouth every 4 (four) hours as needed.   10 tablet   0   . ibuprofen (ADVIL,MOTRIN) 600 MG tablet   Oral   Take 1 tablet (600 mg total) by mouth every 6 (six) hours as needed.   30  tablet   0   . metFORMIN (GLUCOPHAGE) 500 MG tablet   Oral   Take 500 mg by mouth 3 (three) times daily as needed (Take 500mg  with breakfast, 1000mg  with lunch, and 500mg  with dinner).         . methocarbamol (ROBAXIN) 500 MG tablet   Oral   Take 2 tablets (1,000 mg total) by mouth 4 (four) times daily as needed (Pain). Patient not taking: Reported on 08/27/2014   20 tablet   0   . methyldopa (ALDOMET) 500 MG tablet   Oral   Take 500 mg by mouth 2 (two) times daily.         . ondansetron (ZOFRAN ODT) 8 MG disintegrating tablet   Oral   Take 1 tablet (8 mg total) by mouth every 8 (eight) hours as needed for nausea or vomiting.   20 tablet   0   . traMADol (ULTRAM) 50 MG tablet   Oral   Take 50 mg by mouth every 6 (six) hours as needed. For pain         . traZODone (DESYREL) 50 MG tablet   Oral   Take 50 mg by mouth at bedtime.           Allergies Latex and Robitussin (alcohol free)  No family history on file.  Social History Social History  Substance Use Topics  . Smoking status: Current Every Day Smoker  . Smokeless tobacco: None  . Alcohol Use: No    Review of Systems Constitutional: Negative for fever. Cardiovascular: Intermittent chest pain. Respiratory: Negative for shortness of breath. Gastrointestinal: Negative for abdominal pain, vomiting and diarrhea. Musculoskeletal: Negative for back pain. Neurological: Negative for headache 10-point ROS otherwise negative.  ____________________________________________   PHYSICAL EXAM:  VITAL SIGNS: ED Triage Vitals  Enc Vitals Group     BP 03/18/15 1751 141/93 mmHg     Pulse Rate 03/18/15 1751 88     Resp 03/18/15 1751 18     Temp 03/18/15 1751 98.4 F (36.9 C)     Temp Source 03/18/15 1751 Oral     SpO2 03/18/15 1751 100 %     Weight 03/18/15 1751 215 lb (97.523 kg)     Height 03/18/15 1751 5\' 5"  (1.651 m)     Head Cir --      Peak Flow --      Pain Score 03/18/15 1754 6     Pain Loc --       Pain Edu? --      Excl. in [place]? --     Constitutional: Alert and oriented. Well appearing and in no distress. Eyes: Normal exam ENT   Head: Moderate tenderness palpation over the occipital skull. With a mild hematoma. Moderate cervical spine tenderness to palpation. No deformity noted. C-collar in place.   Mouth/Throat: Mucous membranes are moist. Cardiovascular: Normal rate, regular rhythm. No murmur Respiratory: Normal respiratory effort without tachypnea nor retractions. Breath sounds are clear Gastrointestinal: Soft and nontender. No distention.  Musculoskeletal: Nontender with normal range of motion in all extremities. No lower extremity tenderness or edema. Neurologic:  Normal speech and language. No gross focal neurologic deficits  Skin:  Skin is warm, dry and intact.  Psychiatric: Mood and affect are normal. Speech and behavior are normal.   ____________________________________________    EKG  EKG reviewed and interpreted by myself shows normal sinus rhythm at 81 bpm, narrow QRS, normal axis, normal intervals, nonspecific ST changes. No ST elevations.  ____________________________________________    RADIOLOGY  CT shows no large emboli CT head negative CT C-spine negative  ____________________________________________   INITIAL IMPRESSION / ASSESSMENT AND PLAN / ED COURSE  Pertinent labs & imaging results that were available during my care of the patient were reviewed by me and considered in my medical decision making (see chart for details).  Patient presents the emergency department intermittent chest discomfort and 3 syncopal episodes today. Overall the patient has a normal physical exam, denies any chest pain currently. Patient's labs have resulted showing an elevated troponin of 0.05. Patient's kidney function is normal. Given the patient's multiple syncopal episodes, intermittent chest pain and a positive troponin have discussed with the patient the  likelihood of admission to the hospital for telemetry and further workup/evaluation. The patient strongly does not wish to be admitted to the hospital. Given the patient's multiple syncopal episodes with an elevated troponin we'll proceed with a CT of the chest to rule out pulmonary emboli. We'll also obtain CTs of the head and neck as the patient has considerable tenderness and a mild hematoma to the back of the head due to a fall after her most recent syncopal episode.  CT scans are within normal limits. CT chest cannot rule out small emboli but no large emboli. I discussed results with the patient and my desire to  admit her to the hospital given her elevated troponin of multiple syncopal episodes. Patient strongly does not wish to be admitted to the hospital. Patient denies any chest discomfort in the emergency department. Patient states she feels normal and wishes to go home. I discussed with the patient she is agreeable to obtain a second troponin.  Patient's repeat troponin is 0.04, largely unchanged from her initial troponin. Patient again does not wish to be admitted to the hospital however I discussed with the patient follow-up with cardiology, and she has agreed that she will call the number provided tomorrow to arrange follow-up as soon as possible to discuss her episodes. I also discussed very strict return precautions for any further syncopal episodes, with the development of any chest pain she is to return to the emergency department for repeat evaluation and possible admission to the hospital. The patient is agreeable plan.  ____________________________________________   FINAL CLINICAL IMPRESSION(S) / ED DIAGNOSES  Syncope Chest pain   Harvest Dark, MD 03/18/15 2214

## 2015-03-18 NOTE — ED Notes (Signed)
Three months late on period.

## 2015-03-18 NOTE — Discharge Instructions (Signed)
You have been seen in the emergency department today for chest pain and syncope. Your workup has not shown a clear cause. Symptoms. As we discussed please follow-up with your primary care physician in the next 1-2 days for recheck. Return to the emergency department for any further chest pain, trouble breathing, passing out, or any other symptom personally concerning to yourself. Please call the number provided for cardiology to arrange a follow-up appointment as soon as possible.   Syncope Syncope is a medical term for fainting or passing out. This means you lose consciousness and drop to the ground. People are generally unconscious for less than 5 minutes. You may have some muscle twitches for up to 15 seconds before waking up and returning to normal. Syncope occurs more often in older adults, but it can happen to anyone. While most causes of syncope are not dangerous, syncope can be a sign of a serious medical problem. It is important to seek medical care.  CAUSES  Syncope is caused by a sudden drop in blood flow to the brain. The specific cause is often not determined. Factors that can bring on syncope include:  Taking medicines that lower blood pressure.  Sudden changes in posture, such as standing up quickly.  Taking more medicine than prescribed.  Standing in one place for too long.  Seizure disorders.  Dehydration and excessive exposure to heat.  Low blood sugar (hypoglycemia).  Straining to have a bowel movement.  Heart disease, irregular heartbeat, or other circulatory problems.  Fear, emotional distress, seeing blood, or severe pain. SYMPTOMS  Right before fainting, you may:  Feel dizzy or light-headed.  Feel nauseous.  See all white or all black in your field of vision.  Have cold, clammy skin. DIAGNOSIS  Your health care provider will ask about your symptoms, perform a physical exam, and perform an electrocardiogram (ECG) to record the electrical activity of your  heart. Your health care provider may also perform other heart or blood tests to determine the cause of your syncope which may include:  Transthoracic echocardiogram (TTE). During echocardiography, sound waves are used to evaluate how blood flows through your heart.  Transesophageal echocardiogram (TEE).  Cardiac monitoring. This allows your health care provider to monitor your heart rate and rhythm in real time.  Holter monitor. This is a portable device that records your heartbeat and can help diagnose heart arrhythmias. It allows your health care provider to track your heart activity for several days, if needed.  Stress tests by exercise or by giving medicine that makes the heart beat faster. TREATMENT  In most cases, no treatment is needed. Depending on the cause of your syncope, your health care provider may recommend changing or stopping some of your medicines. HOME CARE INSTRUCTIONS  Have someone stay with you until you feel stable.  Do not drive, use machinery, or play sports until your health care provider says it is okay.  Keep all follow-up appointments as directed by your health care provider.  Lie down right away if you start feeling like you might faint. Breathe deeply and steadily. Wait until all the symptoms have passed.  Drink enough fluids to keep your urine clear or pale yellow.  If you are taking blood pressure or heart medicine, get up slowly and take several minutes to sit and then stand. This can reduce dizziness. SEEK IMMEDIATE MEDICAL CARE IF:   You have a severe headache.  You have unusual pain in the chest, abdomen, or back.  You are  bleeding from your mouth or rectum, or you have black or tarry stool.  You have an irregular or very fast heartbeat.  You have pain with breathing.  You have repeated fainting or seizure-like jerking during an episode.  You faint when sitting or lying down.  You have confusion.  You have trouble walking.  You have  severe weakness.  You have vision problems. If you fainted, call your local emergency services (911 in [place] Do not drive yourself to the hospital.    This information is not intended to replace advice given to you by your health care provider. Make sure you discuss any questions you have with your health care provider.   Document Released: 01/11/2005 Document Revised: 05/28/2014 Document Reviewed: 03/12/2011 Elsevier Interactive Patient Education [employer].

## 2015-03-18 NOTE — ED Notes (Signed)
Syncopal episode last night, fell and hit her head.  Also fell again this am.   States she has a big knot on the back of her head and states her neck is sore.  Also c/o chest pain.  ecg done.

## 2015-03-18 NOTE — ED Notes (Signed)
Lab called about delay on Troponin result.

## 2015-07-23 DIAGNOSIS — R55 Syncope and collapse: Secondary | ICD-10-CM | POA: Insufficient documentation

## 2015-10-04 ENCOUNTER — Encounter: Payer: Self-pay | Admitting: *Deleted

## 2015-10-04 ENCOUNTER — Emergency Department: Payer: Medicaid Other

## 2015-10-04 ENCOUNTER — Emergency Department
Admission: EM | Admit: 2015-10-04 | Discharge: 2015-10-04 | Disposition: A | Discharge status: Home / Self Care | Payer: Medicaid Other | Attending: Emergency Medicine | Admitting: Emergency Medicine

## 2015-10-04 DIAGNOSIS — M25562 Pain in left knee: Secondary | ICD-10-CM

## 2015-10-04 DIAGNOSIS — S80212A Abrasion, left knee, initial encounter: Secondary | ICD-10-CM

## 2015-10-04 DIAGNOSIS — E119 Type 2 diabetes mellitus without complications: Secondary | ICD-10-CM | POA: Insufficient documentation

## 2015-10-04 DIAGNOSIS — Z7984 Long term (current) use of oral hypoglycemic drugs: Secondary | ICD-10-CM | POA: Insufficient documentation

## 2015-10-04 DIAGNOSIS — Y929 Unspecified place or not applicable: Secondary | ICD-10-CM | POA: Diagnosis not present

## 2015-10-04 DIAGNOSIS — F17200 Nicotine dependence, unspecified, uncomplicated: Secondary | ICD-10-CM | POA: Diagnosis not present

## 2015-10-04 DIAGNOSIS — I10 Essential (primary) hypertension: Secondary | ICD-10-CM | POA: Insufficient documentation

## 2015-10-04 DIAGNOSIS — W19XXXA Unspecified fall, initial encounter: Secondary | ICD-10-CM | POA: Insufficient documentation

## 2015-10-04 DIAGNOSIS — Y939 Activity, unspecified: Secondary | ICD-10-CM | POA: Insufficient documentation

## 2015-10-04 DIAGNOSIS — Y999 Unspecified external cause status: Secondary | ICD-10-CM | POA: Insufficient documentation

## 2015-10-04 DIAGNOSIS — S8992XA Unspecified injury of left lower leg, initial encounter: Secondary | ICD-10-CM | POA: Diagnosis present

## 2015-10-04 MED ORDER — BACITRACIN ZINC 500 UNIT/GM EX OINT
TOPICAL_OINTMENT | CUTANEOUS | Status: AC
  Filled 2015-10-04: qty 0.9

## 2015-10-04 MED ORDER — NAPROXEN 500 MG PO TABS: 500.0000 mg | ORAL_TABLET | Freq: Two times a day (BID) | ORAL | 0 refills | Status: DC

## 2015-10-04 MED ORDER — NAPROXEN 500 MG PO TABS
500.0000 mg | ORAL_TABLET | Freq: Once | ORAL | Status: AC
  Administered 2015-10-04: 500 mg via ORAL
  Filled 2015-10-04: qty 1

## 2015-10-04 NOTE — ED Notes (Signed)
Cleaned wound with sterile saline. Applied antibiotic ointment and gauze dressing per PA order.   Placed ace bandage on Knee, and instructed pt on use of crutches.

## 2015-10-04 NOTE — ED Provider Notes (Signed)
[facility] Emergency Department Provider Note ____________________________________________  Time seen: Approximately 6:54 PM  I have reviewed the triage vital signs and the nursing notes.   HISTORY  Chief Complaint Knee Injury    HPI Bridget Scott is a 35 y.o. female who presents to the emergency department for evaluation of left knee pain. She states that she fell onto a cement walkway today and now has pain and abrasion to that knee.She states that her "ankles gave out" and caused her to fall. She denies striking her head or any loss of consciousness. She has an abrasion to the left knee. She has not taken any medication prior to arrival.  Past Medical History:  Diagnosis Date  . Cancer ([place])    lymphoma  . Diabetes mellitus without complication ([place])   . Hypertension     There are no active problems to display for this patient.   No past surgical history on file.  Prior to Admission medications   Medication Sig Start Date End Date Taking? Authorizing Provider  amoxicillin (AMOXIL) 500 MG capsule Take 500 mg by mouth 3 (three) times daily.    Historical Provider, MD  dicyclomine (BENTYL) 20 MG tablet Take 1 tablet (20 mg total) by mouth every 6 (six) hours as needed for spasms (for abdominal cramping). 08/27/14   Linton Flemings, MD  HYDROcodone-acetaminophen (NORCO/VICODIN) 5-325 MG per tablet Take 1-2 tablets by mouth every 4 (four) hours as needed. 08/27/14   Linton Flemings, MD  ibuprofen (ADVIL,MOTRIN) 600 MG tablet Take 1 tablet (600 mg total) by mouth every 6 (six) hours as needed. 02/21/15   Heriberto Antigua, MD  metFORMIN (GLUCOPHAGE) 500 MG tablet Take 500 mg by mouth 3 (three) times daily as needed (Take 500mg  with breakfast, 1000mg  with lunch, and 500mg  with dinner).    Historical Provider, MD  methocarbamol (ROBAXIN) 500 MG tablet Take 2 tablets (1,000 mg total) by mouth 4 (four) times daily as needed (Pain). Patient not taking: Reported on 08/27/2014  12/31/13   Elmyra Ricks Pisciotta, [name]  methyldopa (ALDOMET) 500 MG tablet Take 500 mg by mouth 2 (two) times daily. 01/01/15 01/01/16  Historical Provider, MD  naproxen (NAPROSYN) 500 MG tablet Take 1 tablet (500 mg total) by mouth 2 (two) times daily with a meal. 10/04/15   Ajai Harville B Drina Jobst, [name]  ondansetron (ZOFRAN ODT) 8 MG disintegrating tablet Take 1 tablet (8 mg total) by mouth every 8 (eight) hours as needed for nausea or vomiting. 08/27/14   Linton Flemings, MD  traMADol (ULTRAM) 50 MG tablet Take 50 mg by mouth every 6 (six) hours as needed. For pain 01/01/15 01/01/16  Historical Provider, MD  traZODone (DESYREL) 50 MG tablet Take 50 mg by mouth at bedtime. 01/01/15   Historical Provider, MD    Allergies Latex and Robitussin (alcohol free) [guaifenesin]  No family history on file.  Social History Social History  Substance Use Topics  . Smoking status: Current Every Day Smoker  . Smokeless tobacco: Never Used  . Alcohol use No    Review of Systems Constitutional: No recent illness. Cardiovascular: Denies chest pain or palpitations. Respiratory: Denies shortness of breath. Musculoskeletal: Pain in Left knee Skin: Positive for abrasion to the left knee  Neurological: Negative for focal weakness or numbness.  ____________________________________________   PHYSICAL EXAM:  VITAL SIGNS: ED Triage Vitals  Enc Vitals Group     BP 10/04/15 1842 (!) 170/92     Pulse Rate 10/04/15 1842 67     Resp --  Temp 10/04/15 1842 98 F (36.7 C)     Temp Source 10/04/15 1842 Oral     SpO2 10/04/15 1842 99 %     Weight 10/04/15 1842 205 lb (93 kg)     Height 10/04/15 1842 5\' 5"  (1.651 m)     Head Circumference --      Peak Flow --      Pain Score 10/04/15 1846 10     Pain Loc --      Pain Edu? --      Excl. in [place]? --     Constitutional: Alert and oriented. Well appearing and in no acute distress. Eyes: Conjunctivae are normal. EOMI. Head: Atraumatic. Neck: No stridor.  Respiratory: Normal  respiratory effort.   Musculoskeletal: Limited extension and flexion of the left knee secondary to pain. Neurologic:  Normal speech and language. No gross focal neurologic deficits are appreciated. Speech is normal. No gait instability. Skin:  Superficial abrasions noted to the prepatellar area of the left lower extremity Psychiatric: Mood and affect are normal. Speech and behavior are normal.  ____________________________________________   LABS (all labs ordered are listed, but only abnormal results are displayed)  Labs Reviewed - No data to display ____________________________________________  RADIOLOGY  Left knee negative for acute bony abnormality per radiology. ____________________________________________   PROCEDURES  Procedure(s) performed: Jones wrap applied to the left knee by ER tech. Neurovascularly intact post-application. Wound was cleaned and bacitracin was applied.   ____________________________________________   INITIAL IMPRESSION / ASSESSMENT AND PLAN / ED COURSE  Clinical Course    Pertinent labs & imaging results that were available during my care of the patient were reviewed by me and considered in my medical decision making (see chart for details).  Patient was given a prescription for Naprosyn and advised to follow-up with orthopedics for symptoms that are not improving over the week. She was encouraged to return to the emergency department for symptoms change or worsen if she is unable schedule an appointment. ____________________________________________   FINAL CLINICAL IMPRESSION(S) / ED DIAGNOSES  Final diagnoses:  Knee pain, acute, left  Abrasion, knee, left, initial encounter       Victorino Dike, [name] 10/04/15 [id number]    Harvest Dark, MD 10/05/15 2354

## 2015-10-04 NOTE — ED Notes (Signed)
  Reviewed d/c instructions, follow-up care, and prescriptions with pt. Pt verbalized understanding 

## 2015-10-04 NOTE — ED Triage Notes (Signed)
Pt has pain in left knee.  Pt fell onto cement walkway today.  Abrasion to left knee.

## 2015-10-16 DIAGNOSIS — E119 Type 2 diabetes mellitus without complications: Secondary | ICD-10-CM | POA: Diagnosis not present

## 2015-10-16 DIAGNOSIS — F17200 Nicotine dependence, unspecified, uncomplicated: Secondary | ICD-10-CM | POA: Insufficient documentation

## 2015-10-16 DIAGNOSIS — Y92410 Unspecified street and highway as the place of occurrence of the external cause: Secondary | ICD-10-CM | POA: Diagnosis not present

## 2015-10-16 DIAGNOSIS — Z79899 Other long term (current) drug therapy: Secondary | ICD-10-CM | POA: Diagnosis not present

## 2015-10-16 DIAGNOSIS — Z7984 Long term (current) use of oral hypoglycemic drugs: Secondary | ICD-10-CM | POA: Diagnosis not present

## 2015-10-16 DIAGNOSIS — T148 Other injury of unspecified body region: Secondary | ICD-10-CM | POA: Diagnosis not present

## 2015-10-16 DIAGNOSIS — Z91040 Latex allergy status: Secondary | ICD-10-CM | POA: Insufficient documentation

## 2015-10-16 DIAGNOSIS — I10 Essential (primary) hypertension: Secondary | ICD-10-CM | POA: Insufficient documentation

## 2015-10-16 DIAGNOSIS — S0990XA Unspecified injury of head, initial encounter: Secondary | ICD-10-CM | POA: Insufficient documentation

## 2015-10-16 DIAGNOSIS — Y939 Activity, unspecified: Secondary | ICD-10-CM | POA: Diagnosis not present

## 2015-10-16 DIAGNOSIS — Z8571 Personal history of Hodgkin lymphoma: Secondary | ICD-10-CM | POA: Insufficient documentation

## 2015-10-16 DIAGNOSIS — R1010 Upper abdominal pain, unspecified: Secondary | ICD-10-CM | POA: Diagnosis not present

## 2015-10-16 DIAGNOSIS — Y999 Unspecified external cause status: Secondary | ICD-10-CM | POA: Diagnosis not present

## 2015-10-17 ENCOUNTER — Emergency Department (HOSPITAL_COMMUNITY): Payer: Medicaid Other

## 2015-10-17 ENCOUNTER — Emergency Department (HOSPITAL_COMMUNITY)
Admission: EM | Admit: 2015-10-17 | Discharge: 2015-10-17 | Disposition: A | Discharge status: Home / Self Care | Payer: Medicaid Other | Attending: Emergency Medicine | Admitting: Emergency Medicine

## 2015-10-17 ENCOUNTER — Encounter (HOSPITAL_COMMUNITY): Payer: Self-pay

## 2015-10-17 DIAGNOSIS — T07XXXA Unspecified multiple injuries, initial encounter: Secondary | ICD-10-CM

## 2015-10-17 DIAGNOSIS — S0990XA Unspecified injury of head, initial encounter: Secondary | ICD-10-CM

## 2015-10-17 LAB — CBC WITH DIFFERENTIAL/PLATELET
BASOS PCT: 0 %
Basophils Absolute: 0 10*3/uL (ref 0.0–0.1)
EOS ABS: 0.2 10*3/uL (ref 0.0–0.7)
EOS PCT: 1 %
HCT: 44.4 % (ref 36.0–46.0)
Hemoglobin: 15.3 g/dL — ABNORMAL HIGH (ref 12.0–15.0)
LYMPHS PCT: 21 %
Lymphs Abs: 2.9 10*3/uL (ref 0.7–4.0)
MCH: 31.9 pg (ref 26.0–34.0)
MCHC: 34.5 g/dL (ref 30.0–36.0)
MCV: 92.7 fL (ref 78.0–100.0)
MONO ABS: 0.8 10*3/uL (ref 0.1–1.0)
Monocytes Relative: 6 %
Neutro Abs: 10.3 10*3/uL — ABNORMAL HIGH (ref 1.7–7.7)
Neutrophils Relative %: 72 %
PLATELETS: 199 10*3/uL (ref 150–400)
RBC: 4.79 MIL/uL (ref 3.87–5.11)
RDW: 12.8 % (ref 11.5–15.5)
WBC: 14.2 10*3/uL — ABNORMAL HIGH (ref 4.0–10.5)

## 2015-10-17 LAB — I-STAT CHEM 8, ED
BUN: 11 mg/dL (ref 6–20)
CALCIUM ION: 1.17 mmol/L (ref 1.15–1.40)
CREATININE: 0.7 mg/dL (ref 0.44–1.00)
Chloride: 104 mmol/L (ref 101–111)
GLUCOSE: 176 mg/dL — AB (ref 65–99)
HCT: 46 % (ref 36.0–46.0)
HEMOGLOBIN: 15.6 g/dL — AB (ref 12.0–15.0)
Potassium: 3.5 mmol/L (ref 3.5–5.1)
Sodium: 141 mmol/L (ref 135–145)
TCO2: 26 mmol/L (ref 0–100)

## 2015-10-17 LAB — COMPREHENSIVE METABOLIC PANEL
ALT: 21 U/L (ref 14–54)
AST: 19 U/L (ref 15–41)
Albumin: 4.1 g/dL (ref 3.5–5.0)
Alkaline Phosphatase: 60 U/L (ref 38–126)
Anion gap: 10 (ref 5–15)
BUN: 8 mg/dL (ref 6–20)
CHLORIDE: 104 mmol/L (ref 101–111)
CO2: 24 mmol/L (ref 22–32)
Calcium: 9.4 mg/dL (ref 8.9–10.3)
Creatinine, Ser: 0.73 mg/dL (ref 0.44–1.00)
GFR calc Af Amer: >60 mL/min (ref >60)
Glucose, Bld: 176 mg/dL — ABNORMAL HIGH (ref 65–99)
POTASSIUM: 3.4 mmol/L — AB (ref 3.5–5.1)
Sodium: 138 mmol/L (ref 135–145)
Total Bilirubin: 0.6 mg/dL (ref 0.3–1.2)
Total Protein: 7.1 g/dL (ref 6.5–8.1)

## 2015-10-17 LAB — URINALYSIS, ROUTINE W REFLEX MICROSCOPIC
Bilirubin Urine: NEGATIVE
GLUCOSE, UA: NEGATIVE mg/dL
Hgb urine dipstick: NEGATIVE
Ketones, ur: NEGATIVE mg/dL
LEUKOCYTES UA: NEGATIVE
Nitrite: NEGATIVE
PROTEIN: 100 mg/dL — AB
Specific Gravity, Urine: 1.018 (ref 1.005–1.030)
pH: 6 (ref 5.0–8.0)

## 2015-10-17 LAB — URINE MICROSCOPIC-ADD ON: RBC / HPF: NONE SEEN RBC/hpf (ref 0–5)

## 2015-10-17 LAB — I-STAT BETA HCG BLOOD, ED (MC, WL, AP ONLY)

## 2015-10-17 LAB — ETHANOL: Alcohol, Ethyl (B): <5 mg/dL (ref <5)

## 2015-10-17 LAB — LIPASE, BLOOD: LIPASE: 21 U/L (ref 11–51)

## 2015-10-17 MED ORDER — IBUPROFEN 800 MG PO TABS: 800.0000 mg | ORAL_TABLET | Freq: Three times a day (TID) | ORAL | 0 refills | Status: DC | PRN

## 2015-10-17 MED ORDER — SODIUM CHLORIDE 0.9 % IV BOLUS (SEPSIS): 1000.0000 mL | Freq: Once | INTRAVENOUS | Status: DC

## 2015-10-17 MED ORDER — MORPHINE SULFATE (PF) 4 MG/ML IV SOLN
4.0000 mg | Freq: Once | INTRAVENOUS | Status: AC
  Administered 2015-10-17: 4 mg via INTRAVENOUS
  Filled 2015-10-17: qty 1

## 2015-10-17 MED ORDER — IOPAMIDOL (ISOVUE-300) INJECTION 61%
INTRAVENOUS | Status: AC
  Administered 2015-10-17: 100 mL
  Filled 2015-10-17: qty 100

## 2015-10-17 MED ORDER — SODIUM CHLORIDE 0.9 % IV BOLUS (SEPSIS)
1000.0000 mL | Freq: Once | INTRAVENOUS | Status: AC
  Administered 2015-10-17: 1000 mL via INTRAVENOUS

## 2015-10-17 MED ORDER — ONDANSETRON HCL 4 MG/2ML IJ SOLN
4.0000 mg | Freq: Once | INTRAMUSCULAR | Status: AC
  Administered 2015-10-17: 4 mg via INTRAVENOUS
  Filled 2015-10-17: qty 2

## 2015-10-17 MED ORDER — TETANUS-DIPHTH-ACELL PERTUSSIS 5-2.5-18.5 LF-MCG/0.5 IM SUSP
0.5000 mL | Freq: Once | INTRAMUSCULAR | Status: DC
  Filled 2015-10-17: qty 0.5

## 2015-10-17 NOTE — ED Notes (Signed)
MD at bedside. 

## 2015-10-17 NOTE — ED Notes (Signed)
Patient left at this time with all belongings. 

## 2015-10-17 NOTE — ED Notes (Signed)
Ambulated patient to bathroom, walked independently and without assistance.
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
## 2015-10-17 NOTE — ED Notes (Signed)
Gave pt fluid challenge.
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
## 2015-10-26 ENCOUNTER — Emergency Department: Payer: No Typology Code available for payment source

## 2015-10-26 ENCOUNTER — Encounter: Payer: Self-pay | Admitting: Emergency Medicine

## 2015-10-26 DIAGNOSIS — Y999 Unspecified external cause status: Secondary | ICD-10-CM | POA: Diagnosis not present

## 2015-10-26 DIAGNOSIS — I10 Essential (primary) hypertension: Secondary | ICD-10-CM | POA: Insufficient documentation

## 2015-10-26 DIAGNOSIS — E119 Type 2 diabetes mellitus without complications: Secondary | ICD-10-CM | POA: Insufficient documentation

## 2015-10-26 DIAGNOSIS — Y92410 Unspecified street and highway as the place of occurrence of the external cause: Secondary | ICD-10-CM | POA: Insufficient documentation

## 2015-10-26 DIAGNOSIS — F17200 Nicotine dependence, unspecified, uncomplicated: Secondary | ICD-10-CM | POA: Insufficient documentation

## 2015-10-26 DIAGNOSIS — Y9389 Activity, other specified: Secondary | ICD-10-CM | POA: Diagnosis not present

## 2015-10-26 DIAGNOSIS — Z7984 Long term (current) use of oral hypoglycemic drugs: Secondary | ICD-10-CM | POA: Diagnosis not present

## 2015-10-26 DIAGNOSIS — Z79899 Other long term (current) drug therapy: Secondary | ICD-10-CM | POA: Diagnosis not present

## 2015-10-26 DIAGNOSIS — S20212D Contusion of left front wall of thorax, subsequent encounter: Secondary | ICD-10-CM | POA: Insufficient documentation

## 2015-10-26 DIAGNOSIS — Z791 Long term (current) use of non-steroidal anti-inflammatories (NSAID): Secondary | ICD-10-CM | POA: Insufficient documentation

## 2015-10-26 DIAGNOSIS — S299XXA Unspecified injury of thorax, initial encounter: Secondary | ICD-10-CM | POA: Diagnosis present

## 2015-10-26 LAB — CBC
HCT: 45.2 % (ref 35.0–47.0)
HEMOGLOBIN: 15.4 g/dL (ref 12.0–16.0)
MCH: 31.1 pg (ref 26.0–34.0)
MCHC: 34 g/dL (ref 32.0–36.0)
MCV: 91.7 fL (ref 80.0–100.0)
PLATELETS: 226 10*3/uL (ref 150–440)
RBC: 4.93 MIL/uL (ref 3.80–5.20)
RDW: 13.1 % (ref 11.5–14.5)
WBC: 11.5 10*3/uL — AB (ref 3.6–11.0)
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
## 2015-10-27 ENCOUNTER — Emergency Department
Admission: EM | Admit: 2015-10-27 | Discharge: 2015-10-27 | Discharge status: Against Medical Advice | Payer: No Typology Code available for payment source | Attending: Emergency Medicine | Admitting: Emergency Medicine

## 2015-10-27 DIAGNOSIS — S20212D Contusion of left front wall of thorax, subsequent encounter: Secondary | ICD-10-CM

## 2015-10-27 DIAGNOSIS — R079 Chest pain, unspecified: Secondary | ICD-10-CM

## 2015-10-27 LAB — BASIC METABOLIC PANEL
Anion gap: 7 (ref 5–15)
BUN: 11 mg/dL (ref 6–20)
CALCIUM: 9.1 mg/dL (ref 8.9–10.3)
CO2: 23 mmol/L (ref 22–32)
CREATININE: 0.62 mg/dL (ref 0.44–1.00)
Chloride: 107 mmol/L (ref 101–111)
GFR calc Af Amer: >60 mL/min (ref >60)
GLUCOSE: 130 mg/dL — AB (ref 65–99)
Potassium: 3.3 mmol/L — ABNORMAL LOW (ref 3.5–5.1)
SODIUM: 137 mmol/L (ref 135–145)

## 2015-10-27 LAB — TROPONIN I

## 2015-10-27 MED ORDER — KETOROLAC TROMETHAMINE 60 MG/2ML IM SOLN
60.0000 mg | Freq: Once | INTRAMUSCULAR | Status: AC
  Administered 2015-10-27: 60 mg via INTRAMUSCULAR
  Filled 2015-10-27: qty 2

## 2015-10-27 MED ORDER — OXYCODONE-ACETAMINOPHEN 5-325 MG PO TABS
1.0000 | ORAL_TABLET | Freq: Once | ORAL | Status: AC
  Administered 2015-10-27: 1 via ORAL
  Filled 2015-10-27: qty 1

## 2015-10-27 NOTE — ED Notes (Signed)
Pt with htn noted. md notified, no new orders received.
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
## 2015-10-27 NOTE — ED Notes (Signed)
Pt's husband states "we've been here all this time, I mean all day and ain't nobody givin her nothin for her cough." pt has not requested anything for cough. Pt states she has taken tessalon in past without relief of symptoms. Informed pt will notify md of cough medicine request.

## 2015-10-27 NOTE — ED Notes (Addendum)
Pt states she wants to sign out against medical advice. Pt informed of risks of signing out. Pt vervalizes understanding that a diagnosis has not been made and the risks of leaving at this time are worsening of condition, disablity or death. Pt verbalizes understanding. Pt encouraged to follow up with primary md tomorrow and encouraged to remain compliant with blood pressure medication. Pt reports she takes her blood pressure meds in am pt verbalizes she is taking meds when she arrives home.

## 2015-10-27 NOTE — ED Notes (Signed)
Pt states "can I be discharged?" pt informed will notify md.

## 2015-10-27 NOTE — ED Notes (Signed)
md notified pt requesting discharge.

## 2016-09-09 ENCOUNTER — Emergency Department: Payer: Medicaid Other

## 2016-09-09 ENCOUNTER — Encounter: Payer: Self-pay | Admitting: Emergency Medicine

## 2016-09-09 ENCOUNTER — Emergency Department
Admission: EM | Admit: 2016-09-09 | Discharge: 2016-09-09 | Disposition: A | Discharge status: Home / Self Care | Payer: Medicaid Other | Attending: Emergency Medicine | Admitting: Emergency Medicine

## 2016-09-09 DIAGNOSIS — F17200 Nicotine dependence, unspecified, uncomplicated: Secondary | ICD-10-CM | POA: Insufficient documentation

## 2016-09-09 DIAGNOSIS — Z7984 Long term (current) use of oral hypoglycemic drugs: Secondary | ICD-10-CM | POA: Insufficient documentation

## 2016-09-09 DIAGNOSIS — I10 Essential (primary) hypertension: Secondary | ICD-10-CM | POA: Insufficient documentation

## 2016-09-09 DIAGNOSIS — R51 Headache: Secondary | ICD-10-CM | POA: Diagnosis present

## 2016-09-09 DIAGNOSIS — G43909 Migraine, unspecified, not intractable, without status migrainosus: Secondary | ICD-10-CM | POA: Insufficient documentation

## 2016-09-09 DIAGNOSIS — E119 Type 2 diabetes mellitus without complications: Secondary | ICD-10-CM | POA: Insufficient documentation

## 2016-09-09 DIAGNOSIS — Z79899 Other long term (current) drug therapy: Secondary | ICD-10-CM | POA: Insufficient documentation

## 2016-09-09 DIAGNOSIS — Z91040 Latex allergy status: Secondary | ICD-10-CM | POA: Insufficient documentation

## 2016-09-09 LAB — POCT PREGNANCY, URINE: PREG TEST UR: NEGATIVE

## 2016-09-09 MED ORDER — DIPHENHYDRAMINE HCL 50 MG/ML IJ SOLN
25.0000 mg | INTRAMUSCULAR | Status: AC
  Administered 2016-09-09: 25 mg via INTRAVENOUS
  Filled 2016-09-09: qty 1

## 2016-09-09 MED ORDER — DEXAMETHASONE SODIUM PHOSPHATE 10 MG/ML IJ SOLN
10.0000 mg | Freq: Once | INTRAMUSCULAR | Status: AC
  Administered 2016-09-09: 10 mg via INTRAVENOUS
  Filled 2016-09-09 (×2): qty 1

## 2016-09-09 MED ORDER — METOCLOPRAMIDE HCL 5 MG/ML IJ SOLN
10.0000 mg | INTRAMUSCULAR | Status: AC
  Administered 2016-09-09: 10 mg via INTRAVENOUS
  Filled 2016-09-09: qty 2

## 2016-09-09 MED ORDER — SODIUM CHLORIDE 0.9 % IV BOLUS (SEPSIS)
500.0000 mL | INTRAVENOUS | Status: AC
  Administered 2016-09-09: 500 mL via INTRAVENOUS

## 2016-09-09 MED ORDER — KETOROLAC TROMETHAMINE 30 MG/ML IJ SOLN
15.0000 mg | Freq: Once | INTRAMUSCULAR | Status: AC
  Administered 2016-09-09: 15 mg via INTRAVENOUS
  Filled 2016-09-09: qty 1

## 2016-09-09 NOTE — ED Notes (Signed)
Pt has a headache for 3 days .  Pt states taking excedrin without relief.  Pt has nausea and states lights make head hurt worse.  cig smoker.  No sinus congestion/pressure.   Pt alert.  Speech clear.  Ambulates without diff.

## 2016-09-09 NOTE — ED Triage Notes (Signed)
Pt reports headache for three days. Pt reports history of headaches but usually excedrin helps and it has not. Pt reports history of syncopal episodes that have been evaluated by cardiology. Pt reports syncopal episode one week ago and hit head on concrete. Pt alert and oriented in triage. Pt reports pain feels like tension and is causing her to be nauseated.

## 2016-09-09 NOTE — ED Notes (Signed)
Pt sleeping.  Family with pt 

## 2016-09-09 NOTE — ED Provider Notes (Signed)
[facility] Emergency Department Provider Note  ____________________________________________   First MD Initiated Contact with Patient 09/09/16 2001     (approximate)  I have reviewed the triage vital signs and the nursing notes.   HISTORY  Chief Complaint Headache    HPI Bridget Scott is a 36 y.o. female with medical history as listed below who presents for evaluation of persistent headache for about 3 days.  She states that she has had headaches intermittently occasionally over the years but never one like this.  It started gradually about 3 days ago and has been persistent.  Lights and loud noises seem to make it worse, rest makes it a little bit better. She has had nausea but no vomiting.  No visual changes, no dizziness or lightheadedness.  No neck pain or stiffness.  She denies fever/chills, chest pain, shortness of breath, abdominal pain, dysuria.  She has had no numbness, tingling, nor weakness in her extremities.  She describes the  Pain as a severe throbbing, vice like pain throughout her head  Past Medical History:  Diagnosis Date  . 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 Diabetes mellitus without complication ([place])   . Hypertension     There are no active problems to display for this patient.   No past surgical history on file.  Prior to Admission medications   Medication Sig Start Date End Date Taking? Authorizing Provider  citalopram (CELEXA) 10 MG tablet Take 10 mg by mouth daily. 06/06/15 06/05/16  [provider]  HYDROcodone-acetaminophen (NORCO/VICODIN) 5-325 MG tablet Take 1 tablet by mouth every 4 (four) hours as needed for moderate pain.  10/07/15   [provider]  ibuprofen (ADVIL,MOTRIN) 800 MG tablet Take 1 tablet (800 mg total) by mouth every 8 (eight) hours as needed for mild pain. 10/17/15   Ward, Delice Bison, DO  lisinopril-hydrochlorothiazide (PRINZIDE,ZESTORETIC) 20-25 MG tablet Take 1 tablet by mouth daily. 06/06/15  06/05/16  [provider]  metFORMIN (GLUCOPHAGE) 500 MG tablet Take 500-1,000 mg by mouth See admin instructions. Take 500mg  with breakfast, 1000mg  with lunch, and 500mg  with dinner    [provider]  methyldopa (ALDOMET) 500 MG tablet Take 500 mg by mouth 2 (two) times daily. 01/01/15 01/01/16  [provider]  nitroGLYCERIN (NITROSTAT) 0.4 MG SL tablet Place 0.4 mg under the tongue every 5 (five) minutes as needed for chest pain.  07/23/15 07/22/16  [provider]  pantoprazole (PROTONIX) 40 MG tablet Take 40 mg by mouth daily. 06/06/15   [provider]  pravastatin (PRAVACHOL) 40 MG tablet Take 40 mg by mouth daily. 06/06/15 06/05/16  [provider]  traZODone (DESYREL) 50 MG tablet Take 50 mg by mouth at bedtime. 01/01/15   [provider]    Allergies Latex and Robitussin (alcohol free) [guaifenesin]  No family history on file.  Social History Social History  Substance Use Topics  . Smoking status: Current Every Day Smoker  . Smokeless tobacco: Never Used  . Alcohol use No    Review of Systems Constitutional: No fever/chills Eyes: No visual changes. +Photophobia ENT: No sore throat. Cardiovascular: Denies chest pain. Respiratory: Denies shortness of breath. Gastrointestinal: No abdominal pain.  Nausea, no vomiting.  No diarrhea.  No constipation. Genitourinary: Negative for dysuria. Musculoskeletal: Negative for neck pain.  Negative for back pain. Integumentary: Negative for rash. Neurological: Gradual onset vice-like throbbing bilateral headache for three days.  No focal neuro deficits.   ____________________________________________   PHYSICAL  EXAM:  VITAL SIGNS: ED Triage Vitals  Enc Vitals Group     BP 09/09/16 1805 (!) 180/100     Pulse Rate 09/09/16 1805 76     Resp 09/09/16 1805 18     Temp 09/09/16 1805 (!) 97.5 F (36.4 C)     Temp Source 09/09/16 1805 Oral     SpO2 09/09/16 1805 97 %     Weight  09/09/16 1806 93 kg (205 lb)     Height 09/09/16 1806 1.651 m (5\' 5" )     Head Circumference --      Peak Flow --      Pain Score 09/09/16 1805 8     Pain Loc --      Pain Edu? --      Excl. in [place]? --     Constitutional: Alert and oriented. Well appearing and in no acute distress. Eyes: Conjunctivae are normal. PERRL. EOMI. Normal fundoscopic exam with no evidence of papilledema, but difficult to visualize due to the patient's photophobia Head: Atraumatic. Nose: No congestion/rhinnorhea. Mouth/Throat: Mucous membranes are moist. Neck: No stridor.  No meningeal signs.   Cardiovascular: Normal rate, regular rhythm. Good peripheral circulation. Grossly normal heart sounds. Respiratory: Normal respiratory effort.  No retractions. Lungs CTAB. Gastrointestinal: Soft and nontender. No distention.  Musculoskeletal: No lower extremity tenderness nor edema. No gross deformities of extremities. Neurologic:  Normal speech and language. No gross focal neurologic deficits are appreciated.  Skin:  Skin is warm, dry and intact. No rash noted. Psychiatric: Mood and affect are normal. Speech and behavior are normal.  ____________________________________________   LABS (all labs ordered are listed, but only abnormal results are displayed)  Labs Reviewed  POCT PREGNANCY, URINE   ____________________________________________  EKG  None - EKG not ordered by ED physician ____________________________________________  RADIOLOGY   Ct Head Wo Contrast  Result Date: 09/09/2016 CLINICAL DATA:  Headache for 3 days EXAM: CT HEAD WITHOUT CONTRAST TECHNIQUE: Contiguous axial images were obtained from the base of the skull through the vertex without intravenous contrast. COMPARISON:  10/17/2015 FINDINGS: Brain: No evidence of acute infarction, hemorrhage, hydrocephalus, extra-axial collection or mass lesion/mass effect. Vascular: No hyperdense vessel or unexpected calcification. Skull: Normal. Negative for  fracture or focal lesion. Sinuses/Orbits: No acute finding. Other: None IMPRESSION: No CT evidence for acute intracranial abnormality. Electronically Signed   By: Donavan Foil M.D.   On: 09/09/2016 18:54    ____________________________________________   PROCEDURES  Critical Care performed: No   Procedure(s) performed:   Procedures   ____________________________________________   INITIAL IMPRESSION / ASSESSMENT AND PLAN / ED COURSE  Pertinent labs & imaging results that were available during my care of the patient were reviewed by me and considered in my medical decision making (see chart for details).  CT scan is normal.  The patient does have an elevated blood pressure but this may be secondary to her discomfort.  Her signs and symptoms are most consistent with migraine although all acute emergent causes of acute headache must be considered.  However she has no infectious signs or symptoms and in spite of her symptoms at the moment she is interacting with me appropriately, even making some jokes and laughing.  I recommended that we treat her empirically for migraine in reassess to see if this helps and she is in agreement.  If this is successful she should be appropriate for outpatient follow-up, particularly if the headache returns, and at that point she may benefit from additional outpatient workup  for other potential causes of headache including but not limited to pseudotumor cerebri, tension headaches, etc.   Clinical Course as of Sep 09 2209  Thu Sep 09, 2016  2148 Patient is sleeping comfortably but awoke to gentle touch and voice.  She states her headache is gone and she is comfortable with the plan to go home.  I gave my usual and customary return precautions.     [CF]    Clinical Course User Index [CF] Hinda Kehr, MD    ____________________________________________  FINAL CLINICAL IMPRESSION(S) / ED DIAGNOSES  Final diagnoses:  Migraine without status  migrainosus, not intractable, unspecified migraine type     MEDICATIONS GIVEN DURING THIS VISIT:  Medications  sodium chloride 0.9 % bolus 500 mL (0 mLs Intravenous Stopped 09/09/16 2134)  ketorolac (TORADOL) 30 MG/ML injection 15 mg (15 mg Intravenous Given 09/09/16 2019)  dexamethasone (DECADRON) injection 10 mg (10 mg Intravenous Given 09/09/16 2020)  metoCLOPramide (REGLAN) injection 10 mg (10 mg Intravenous Given 09/09/16 2019)  diphenhydrAMINE (BENADRYL) injection 25 mg (25 mg Intravenous Given 09/09/16 2020)     NEW OUTPATIENT MEDICATIONS STARTED DURING THIS VISIT:  Discharge Medication List as of 09/09/2016  9:49 PM      Discharge Medication List as of 09/09/2016  9:49 PM      Discharge Medication List as of 09/09/2016  9:49 PM       Note:  This document was prepared using Dragon voice recognition software and may include unintentional dictation errors.    Hinda Kehr, MD 09/09/16 2211

## 2016-09-09 NOTE — Discharge Instructions (Signed)

## 2017-03-15 ENCOUNTER — Emergency Department (HOSPITAL_COMMUNITY): Payer: Medicaid Other

## 2017-03-15 ENCOUNTER — Emergency Department (HOSPITAL_COMMUNITY)
Admission: EM | Admit: 2017-03-15 | Discharge: 2017-03-15 | Disposition: A | Discharge status: Home / Self Care | Payer: Medicaid Other | Attending: Emergency Medicine | Admitting: Emergency Medicine

## 2017-03-15 ENCOUNTER — Encounter (HOSPITAL_COMMUNITY): Payer: Self-pay

## 2017-03-15 DIAGNOSIS — R5383 Other fatigue: Secondary | ICD-10-CM | POA: Diagnosis not present

## 2017-03-15 DIAGNOSIS — E119 Type 2 diabetes mellitus without complications: Secondary | ICD-10-CM | POA: Diagnosis not present

## 2017-03-15 DIAGNOSIS — F17210 Nicotine dependence, cigarettes, uncomplicated: Secondary | ICD-10-CM | POA: Insufficient documentation

## 2017-03-15 DIAGNOSIS — I10 Essential (primary) hypertension: Secondary | ICD-10-CM

## 2017-03-15 DIAGNOSIS — R112 Nausea with vomiting, unspecified: Secondary | ICD-10-CM | POA: Insufficient documentation

## 2017-03-15 DIAGNOSIS — R110 Nausea: Secondary | ICD-10-CM

## 2017-03-15 LAB — BASIC METABOLIC PANEL
Anion gap: 14 (ref 5–15)
BUN: 8 mg/dL (ref 6–20)
CALCIUM: 9.2 mg/dL (ref 8.9–10.3)
CO2: 20 mmol/L — AB (ref 22–32)
CREATININE: 0.58 mg/dL (ref 0.44–1.00)
Chloride: 105 mmol/L (ref 101–111)
Glucose, Bld: 129 mg/dL — ABNORMAL HIGH (ref 65–99)
Potassium: 3.4 mmol/L — ABNORMAL LOW (ref 3.5–5.1)
Sodium: 139 mmol/L (ref 135–145)

## 2017-03-15 LAB — CBC
HCT: 46.1 % — ABNORMAL HIGH (ref 36.0–46.0)
Hemoglobin: 15.8 g/dL — ABNORMAL HIGH (ref 12.0–15.0)
MCH: 32 pg (ref 26.0–34.0)
MCHC: 34.3 g/dL (ref 30.0–36.0)
MCV: 93.5 fL (ref 78.0–100.0)
PLATELETS: 168 10*3/uL (ref 150–400)
RBC: 4.93 MIL/uL (ref 3.87–5.11)
RDW: 13.2 % (ref 11.5–15.5)
WBC: 8.5 10*3/uL (ref 4.0–10.5)

## 2017-03-15 LAB — I-STAT TROPONIN, ED: TROPONIN I, POC: 0.01 ng/mL (ref 0.00–0.08)

## 2017-03-15 LAB — I-STAT BETA HCG BLOOD, ED (MC, WL, AP ONLY): I-stat hCG, quantitative: <5 m[IU]/mL (ref <5)

## 2017-03-15 MED ORDER — ONDANSETRON HCL 4 MG PO TABS
4.0000 mg | ORAL_TABLET | Freq: Once | ORAL | Status: AC
  Administered 2017-03-15: 4 mg via ORAL
  Filled 2017-03-15: qty 1

## 2017-03-15 MED ORDER — LISINOPRIL-HYDROCHLOROTHIAZIDE 20-25 MG PO TABS: 1.0000 | ORAL_TABLET | Freq: Every day | ORAL | 0 refills | Status: DC

## 2017-03-15 MED ORDER — HYDROCHLOROTHIAZIDE 25 MG PO TABS
25.0000 mg | ORAL_TABLET | Freq: Every day | ORAL | Status: DC
  Administered 2017-03-15: 25 mg via ORAL
  Filled 2017-03-15: qty 1

## 2017-03-15 MED ORDER — LISINOPRIL 20 MG PO TABS
20.0000 mg | ORAL_TABLET | Freq: Once | ORAL | Status: AC
  Administered 2017-03-15: 20 mg via ORAL
  Filled 2017-03-15: qty 1

## 2017-03-15 MED ORDER — ONDANSETRON HCL 4 MG PO TABS: 4.0000 mg | ORAL_TABLET | Freq: Three times a day (TID) | ORAL | 0 refills | Status: DC | PRN

## 2017-03-15 NOTE — ED Triage Notes (Addendum)
Per Pt, Pt is coming from UC with complaints of Hypertension, fatigue and headaches. Pt has hx of HTN, but is not compliant with her meds. Reports some intermittent chest pain in the middle of her chest. Hx of cancer.

## 2017-03-15 NOTE — ED Provider Notes (Signed)
[facility] EMERGENCY DEPARTMENT Provider Note   CSN: [id number] Arrival date & time: 03/15/17  1718     History   Chief Complaint Chief Complaint  Patient presents with  . Hypertension    HPI Bridget Scott is a 37 y.o. female.  The history is provided by the patient, a relative and medical records. No language interpreter was used.  Hypertension  This is a chronic problem. The current episode started more than 1 week ago. The problem occurs constantly. The problem has not changed since onset.Pertinent negatives include no chest pain, no abdominal pain, no headaches and no shortness of breath. Nothing aggravates the symptoms. Nothing relieves the symptoms. She has tried nothing for the symptoms. The treatment provided no relief.  Emesis   This is a new problem. The current episode started more than 2 days ago. The problem occurs 2 to 4 times per day. The problem has not changed since onset.The emesis has an appearance of stomach contents. There has been no fever. Pertinent negatives include no abdominal pain, no chills, no cough, no diarrhea, no fever, no headaches, no myalgias and no URI.    Past Medical History:  Diagnosis Date  . 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 Diabetes mellitus without complication ([place])   . Hypertension     There are no active problems to display for this patient.   History reviewed. No pertinent surgical history.  OB History    No data available       Home Medications    Prior to Admission medications   Medication Sig Start Date End Date Taking? Authorizing Provider  HYDROcodone-acetaminophen (NORCO/VICODIN) 5-325 MG tablet Take 1 tablet by mouth every 4 (four) hours as needed for moderate pain.  10/07/15   [provider]  ibuprofen (ADVIL,MOTRIN) 800 MG tablet Take 1 tablet (800 mg total) by mouth every 8 (eight) hours as needed for mild pain. 10/17/15   Ward, Delice Bison, DO  nitroGLYCERIN (NITROSTAT) 0.4 MG SL tablet  Place 0.4 mg under the tongue every 5 (five) minutes as needed for chest pain.  07/23/15 07/22/16  [provider]  pantoprazole (PROTONIX) 40 MG tablet Take 40 mg by mouth daily. 06/06/15   [provider]  pravastatin (PRAVACHOL) 40 MG tablet Take 40 mg by mouth daily. 06/06/15 06/05/16  [provider]  traZODone (DESYREL) 50 MG tablet Take 50 mg by mouth at bedtime. 01/01/15   [provider]    Family History No family history on file.  Social History Social History   Tobacco Use  . Smoking status: Current Every Day Smoker    Packs/day: 2.00    Types: Cigarettes  . Smokeless tobacco: Never Used  Substance Use Topics  . Alcohol use: No  . Drug use: No     Allergies   Bengay pain relief [menthol]; Guaifenesin; and Latex   Review of Systems Review of Systems  Constitutional: Positive for fatigue. Negative for chills, diaphoresis and fever.  HENT: Negative for congestion and rhinorrhea.   Respiratory: Negative for cough, chest tightness, shortness of breath, wheezing and stridor.   Cardiovascular: Negative for chest pain, palpitations and leg swelling.  Gastrointestinal: Positive for nausea and vomiting. Negative for abdominal distention, abdominal pain, constipation and diarrhea.  Genitourinary: Negative for dysuria and flank pain.  Musculoskeletal: Negative for back pain, myalgias and neck pain.  Skin: Negative for rash and wound.  Neurological: Negative for tremors, seizures, speech difficulty, weakness, light-headedness, numbness and headaches.  Psychiatric/Behavioral: Negative for confusion.  All other systems reviewed and are negative.    Physical Exam Updated Vital Signs BP (!) 196/107   Pulse 81   Temp (!) 97.4 F (36.3 C) (Oral)   Resp 18   Ht 5\' 5"  (1.651 m)   Wt 97.4 kg (214 lb 12.8 oz)   LMP 03/10/2017   SpO2 100%   BMI 35.74 kg/m   Physical Exam  Constitutional: She is oriented to person, place, and time. She  appears well-developed and well-nourished. No distress.  HENT:  Head: Normocephalic and atraumatic.  Nose: Nose normal.  Mouth/Throat: Oropharynx is clear and moist. No oropharyngeal exudate.  Eyes: Conjunctivae and EOM are normal. Pupils are equal, round, and reactive to light.  Neck: Normal range of motion.  Cardiovascular: Normal rate and intact distal pulses.  No murmur heard. Pulmonary/Chest: Breath sounds normal. No stridor. No respiratory distress. She has no wheezes. She exhibits no tenderness.  Abdominal: Bowel sounds are normal. She exhibits no distension. There is no tenderness.  Musculoskeletal: She exhibits no edema or tenderness.  Neurological: She is alert and oriented to person, place, and time. No cranial nerve deficit or sensory deficit. She exhibits normal muscle tone. Coordination normal.  Skin: Capillary refill takes less than 2 seconds. No rash noted. She is not diaphoretic. No erythema.  Psychiatric: She has a normal mood and affect.  Nursing note and vitals reviewed.    ED Treatments / Results  Labs (all labs ordered are listed, but only abnormal results are displayed) Labs Reviewed  BASIC METABOLIC PANEL - Abnormal; Notable for the following components:      Result Value   Potassium 3.4 (*)    CO2 20 (*)    Glucose, Bld 129 (*)    All other components within normal limits  CBC - Abnormal; Notable for the following components:   Hemoglobin 15.8 (*)    HCT 46.1 (*)    All other components within normal limits  I-STAT TROPONIN, ED  I-STAT BETA HCG BLOOD, ED ([facility], [facility], [facility] ONLY)    EKG  EKG Interpretation  Date/Time:  Tuesday March 15 2017 17:29:30 EST Ventricular Rate:  64 PR Interval:  134 QRS Duration: 94 QT Interval:  426 QTC Calculation: 439 R Axis:   41 Text Interpretation:  Normal sinus rhythm Incomplete right bundle branch block ST & T wave abnormality, consider inferolateral ischemia Abnormal ECG Similar to prior. No STEMI.  Confirmed by  Nanda Quinton [phone]) on 03/15/2017 5:32:23 PM       Radiology Dg Chest 2 View  Result Date: 03/15/2017 CLINICAL DATA:  Central chest pain with cough EXAM: CHEST  2 VIEW COMPARISON:  10/27/2015 FINDINGS: The heart size and mediastinal contours are within normal limits. Both lungs are clear. The visualized skeletal structures are unremarkable. IMPRESSION: No active cardiopulmonary disease. Electronically Signed   By: Ashley Royalty M.D.   On: 03/15/2017 18:17    Procedures Procedures (including critical care time)  Medications Ordered in ED Medications  lisinopril (PRINIVIL,ZESTRIL) tablet 20 mg (20 mg Oral Given 03/15/17 2204)  ondansetron (ZOFRAN) tablet 4 mg (4 mg Oral Given 03/15/17 2204)     Initial Impression / Assessment and Plan / ED Course  I have reviewed the triage vital signs and the nursing notes.  Pertinent labs & imaging results that were available during my care of the patient were reviewed by me and considered in my medical decision making (see chart for details).     Bridget Decamp  Scott is a 37 y.o. female with a past medical history significant for prior lymphoma status post chemotherapy and subsequent dental infections, diabetes, and hypertension who presents with hypertension, nausea, and vomiting.  Patient was instructed to come in by an urgent care after she was found to have elevated blood pressures in excess of 676 systolic as well as nausea and vomiting.  Patient was also found to have some leukocytes and protein in her urine at the outside facility.  Patient reports that she has not taken her nausea medicine for several months and has not taken her blood pressures.  She does say that she normally runs in the 170s and 180s at home.  She denies any chest pain, palpitations, shortness of breath.  She denies any fevers, chills, constipation, or diarrhea.  She reports several days of nausea and vomiting.  She says that she has had no headaches, vision changes, or any  neurologic complaints.  She says that she would like to get started back on her home blood pressure medicine.  She reports that she is scheduled to have dental procedures next month.  On exam, patient was found to be hypertensive with a blood pressure into the 195K systolic.  Patient's lungs were clear.  Chest was nontender.  No focal neurologic deficits seen.  Abdomen nontender.  No significant edema seen.  Patient overall appears well.  Patient had workup to look for organ injury in the setting of her elevated blood pressure and was given her home blood pressure medicine.  Patient's lab results are seen above.  Troponin negative.  Patient is not pregnant.  Glucose minimally elevated.  No evidence of kidney dysfunction.  No leukocytosis.  No anemia.  Chest x-ray shows no pneumonia.  EKG showed no evidence of STEMI.  After blood pressure began to improve, patient fell decrease in her nausea.  Patient also given nausea medicine.  Shared decision making conversation held with patient as to admission for hypertensive urgency in the setting of nausea and vomiting versus going home with new blood pressure medicine prescription.  Patient would rather go home as she was feeling better and follow-up with a primary doctor.  Strict return precautions were given and understood.  Given her lack of neurologic complaints, do not feel patient needed head CT scan at this time however patient was advised that if she develops any headaches, vision changes or other neurologic problem, she would likely need this.  Patient and family voiced understanding of the plan of care and had no other questions or concerns.  Patient discharged in good condition with resolution of nausea, vomiting symptoms as well as improving blood pressure.     Final Clinical Impressions(s) / ED Diagnoses   Final diagnoses:  Hypertension, unspecified type  Nausea    ED Discharge Orders        Ordered    lisinopril-hydrochlorothiazide  (PRINZIDE,ZESTORETIC) 20-25 MG tablet  Daily     03/15/17 2304    ondansetron (ZOFRAN) 4 MG tablet  Every 8 hours PRN     03/15/17 2305      Clinical Impression: 1. Hypertension, unspecified type   2. Nausea     Disposition: Discharge  Condition: Good  I have discussed the results, Dx and Tx plan with the pt(& family if present). He/she/they expressed understanding and agree(s) with the plan. Discharge instructions discussed at great length. Strict return precautions discussed and pt &/or family have verbalized understanding of the instructions. No further questions at time of discharge.  Discharge Medication List as of 03/15/2017 11:05 PM    START taking these medications   Details  lisinopril-hydrochlorothiazide (PRINZIDE,ZESTORETIC) 20-25 MG tablet Take 1 tablet by mouth daily., Starting Tue 03/15/2017, Print    ondansetron (ZOFRAN) 4 MG tablet Take 1 tablet (4 mg total) by mouth every 8 (eight) hours as needed for nausea or vomiting., Starting Tue 03/15/2017, Print        Follow Up: Augusto Gamble, DO [address] [place] [address] [place] [address] [id number] mc [place]       Kelleen Stolze, [name], MD 03/16/17 [phone]

## 2017-03-15 NOTE — ED Notes (Addendum)
Called pt for recheck vitals no answer

## 2017-03-15 NOTE — Discharge Instructions (Signed)
Your workup today showed elevated blood pressure however it improved with your home medicine.  We did not find evidence of acute infection.  Please use the nausea medicine to help with your nausea and stay hydrated.  Please follow-up with your primary doctor in several days.  If any symptoms change or worsen, please return to the nearest emergency department.

## 2017-03-15 NOTE — ED Notes (Signed)
Pt waiting at desk for discharge instructions. Denies pain. Informed pt of discharge instructions and prescriptions

## 2017-03-15 NOTE — ED Notes (Signed)
Pt requesting discharge papers.  MD made aware.

## 2017-04-07 IMAGING — CR DG CHEST 2V
2 series · 2 of 2 positions shown · non-contrast
Comparison: Chest CT dated 10/17/2015

CLINICAL DATA: 35-year-old female with chest pain

EXAM:
CHEST  2 VIEW

[chest pa]
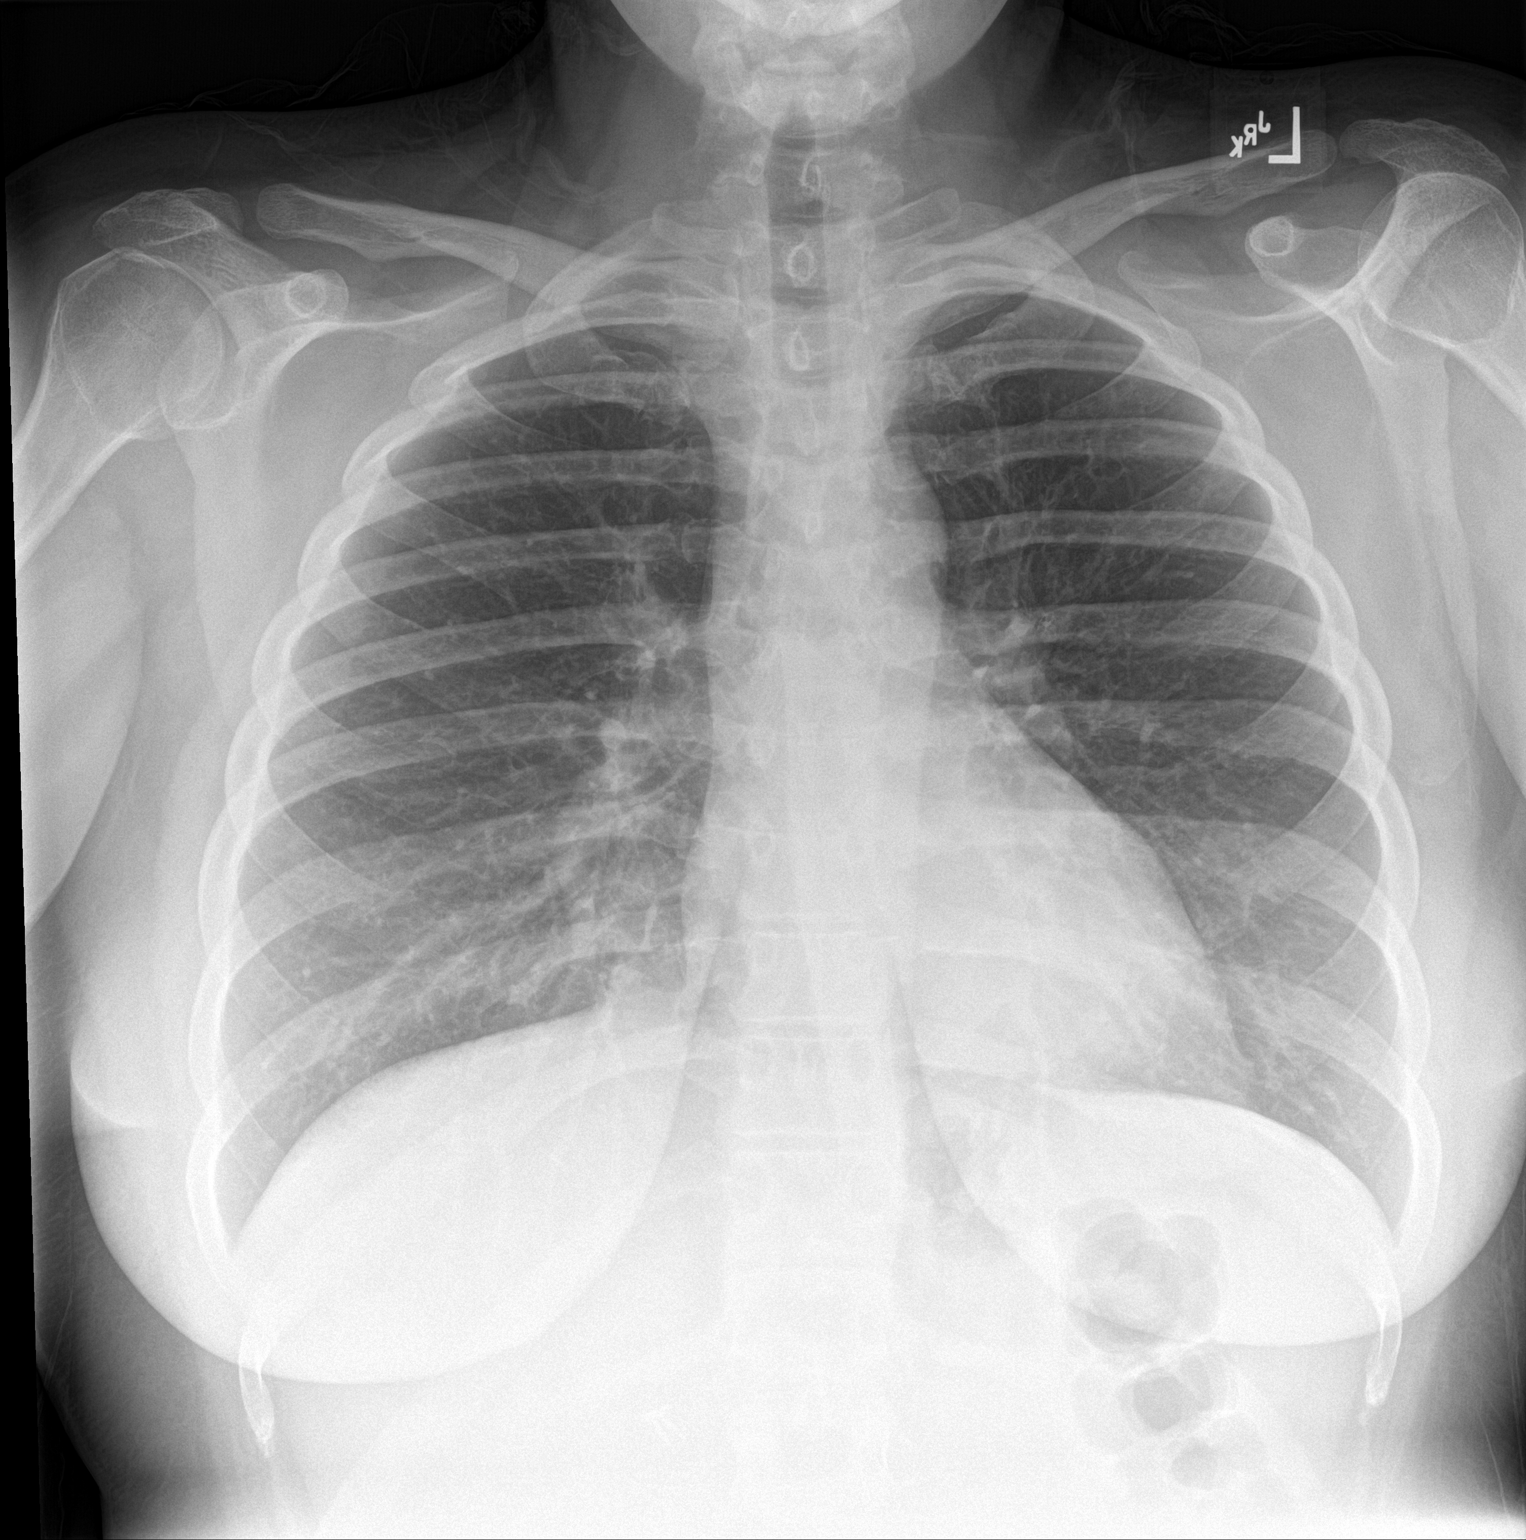

[chest lat]
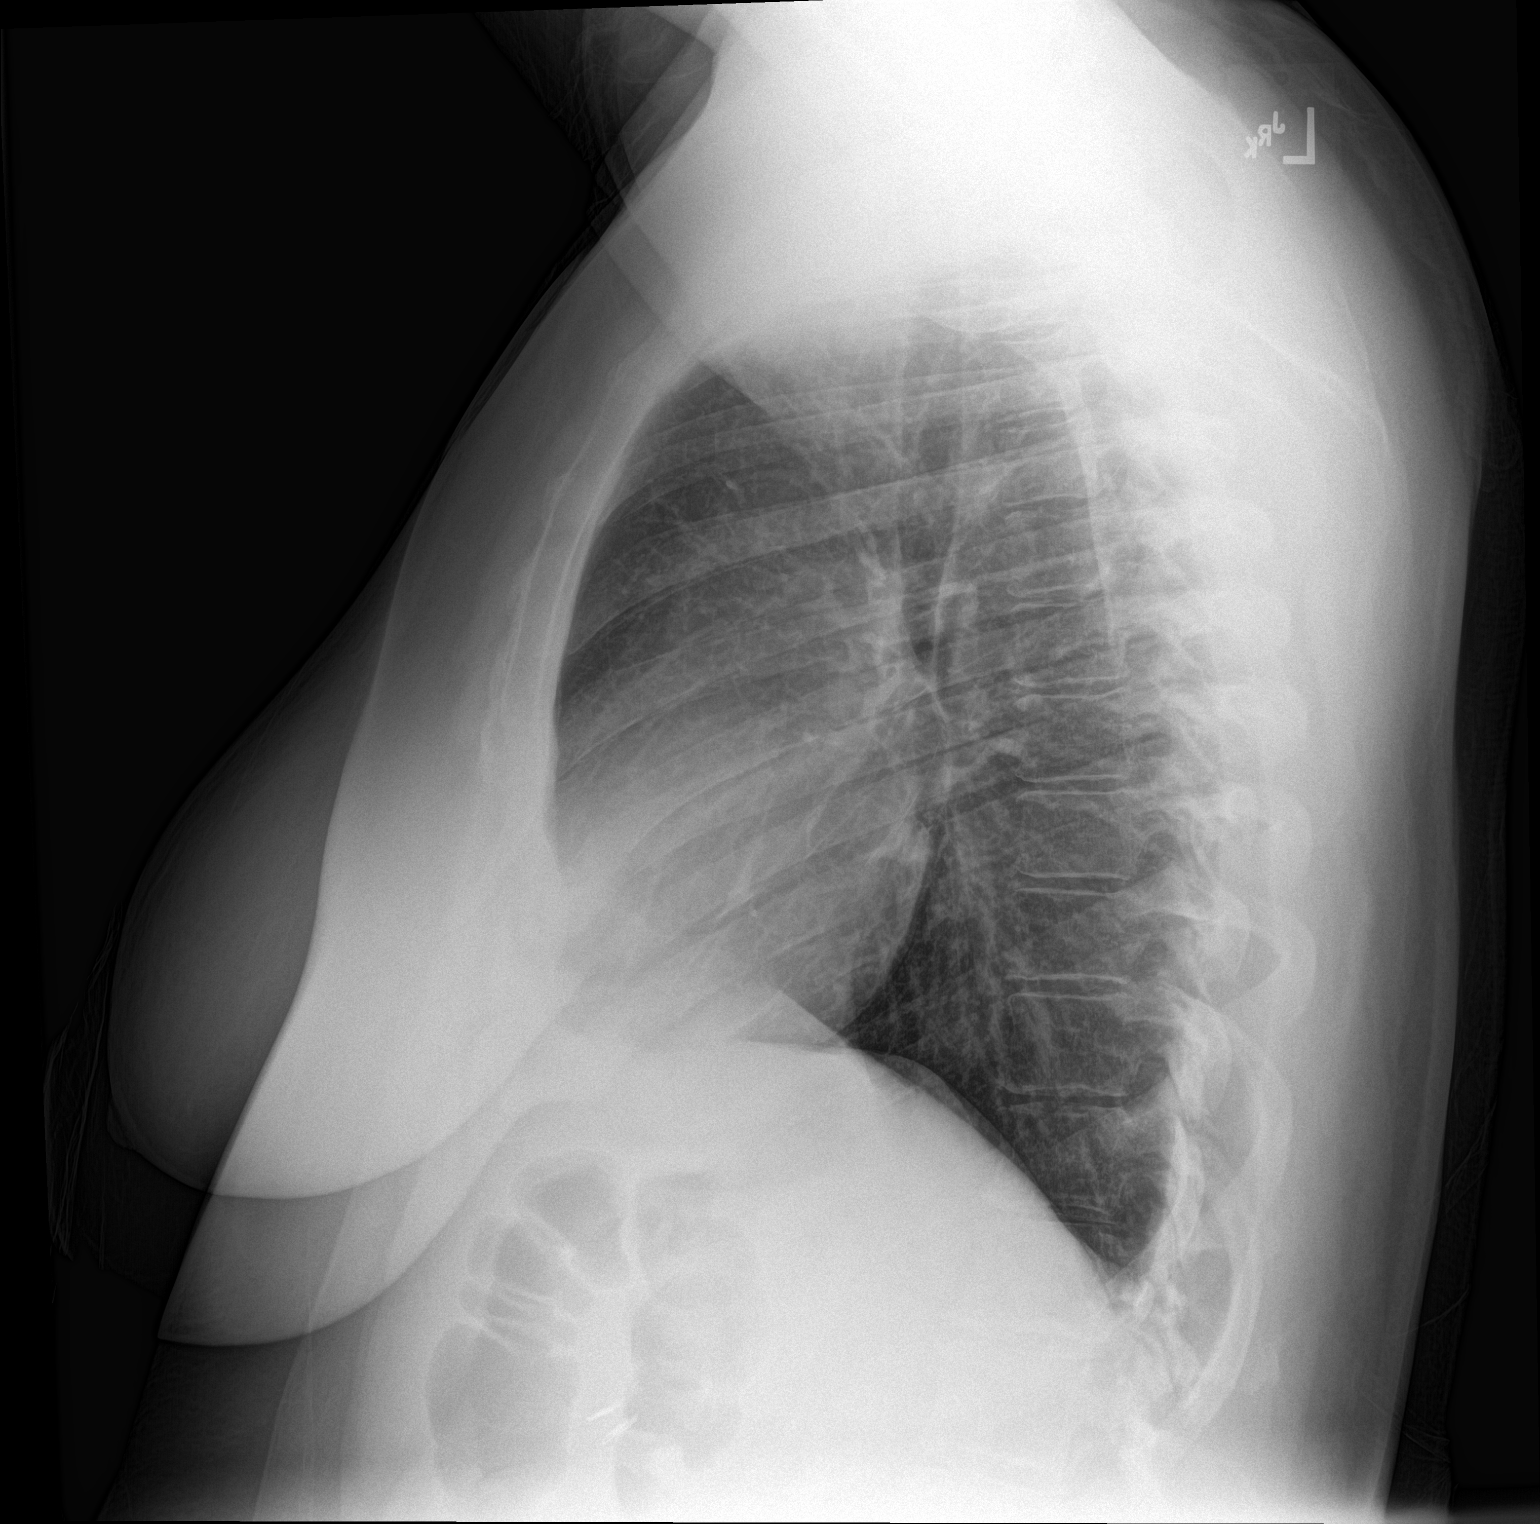

[2 of 2 positions shown; findings below may reference images not displayed]

FINDINGS: The heart size and mediastinal contours are within normal limits.
Both lungs are clear. The visualized skeletal structures are
unremarkable.
IMPRESSION: No active cardiopulmonary disease.

## 2017-05-25 ENCOUNTER — Encounter (HOSPITAL_COMMUNITY): Payer: Self-pay | Admitting: *Deleted

## 2017-05-25 ENCOUNTER — Other Ambulatory Visit: Payer: Self-pay

## 2017-05-25 NOTE — Progress Notes (Signed)
Pt denies any acute cardiopulmonary issues. Pt under the care of Dr. Flonnie Overman, Cardiology. Pt denies having a cardiac cath but stated that a stress test was performed at [facility] in [place] ([place] unknown at this time) " in 2017, it was okay." Pt stated that Dr. Hoyt Koch has cardiac clearance; lvm on office phone to fax clearance note. Pt denies recent labs. Pt stated that she was instructed by MD to stop Aspirin and NSAID's (Excedrin Migraine). Pt also made aware to stop taking vitamins, fish oil and herbal medications. Pt made aware to not take Metformin DOS. Pt made aware to check BG every 2 hours prior to arrival to hospital on DOS. Pt made aware to treat a BG < 70 with  4 ounces of apple or cranberry juice, wait 15 minutes after intervention to recheck BG, if BG remains < 70, call Short Stay unit to speak with a nurse. Pt verbalized understanding of all pre-op instructions. Anesthesia asked to review pt EKG.

## 2017-05-25 NOTE — H&P (Signed)
HISTORY AND PHYSICAL  Bridget Scott is a 37 y.o. female patient with CC: painful teeth. Referred by general dentist for multiple extractions.  No diagnosis found.  Past Medical History:  Diagnosis Date  . Cancer ([place])    lymphoma  . Diabetes mellitus without complication ([place])   . Hypertension     No current facility-administered medications for this encounter.    Current Outpatient Medications  Medication Sig Dispense Refill  . albuterol (PROAIR HFA) 108 (90 Base) MCG/ACT inhaler Inhale 2 puffs into the lungs every 6 (six) hours as needed for wheezing or shortness of breath.    Marland Kitchen aspirin-acetaminophen-caffeine (EXCEDRIN MIGRAINE) 250-250-65 MG tablet Take 1-2 tablets by mouth every 6 (six) hours as needed for headache or migraine.    Marland Kitchen lisinopril-hydrochlorothiazide (PRINZIDE,ZESTORETIC) 20-25 MG tablet Take 1 tablet by mouth daily. 30 tablet 0  . metFORMIN (GLUCOPHAGE) 500 MG tablet Take 500 mg by mouth 3 (three) times daily.    . nitroGLYCERIN (NITROSTAT) 0.4 MG SL tablet Place 0.4 mg under the tongue every 5 (five) minutes as needed for chest pain.     . pantoprazole (PROTONIX) 40 MG tablet Take 40 mg by mouth daily as needed (for reflux symptoms).     Marland Kitchen ibuprofen (ADVIL,MOTRIN) 800 MG tablet Take 1 tablet (800 mg total) by mouth every 8 (eight) hours as needed for mild pain. (Patient not taking: Reported on 03/15/2017) 30 tablet 0  . ondansetron (ZOFRAN) 4 MG tablet Take 1 tablet (4 mg total) by mouth every 8 (eight) hours as needed for nausea or vomiting. (Patient not taking: Reported on 05/23/2017) 12 tablet 0   Allergies  Allergen Reactions  . Bengay Pain Relief [Menthol] Rash  . Guaifenesin Hives, Rash and Other (See Comments)    "Made me sick" (Robitussin)  . Latex Rash   Active Problems:   * No active hospital problems. *  Vitals: There were no vitals taken for this visit. Lab results:No results found for this or any previous visit (from the past 50  hour(s)). Radiology Results: No results found. General appearance: alert, cooperative, no distress and mildly obese Head: Normocephalic, without obvious abnormality, atraumatic Eyes: negative Nose: Nares normal. Septum midline. Mucosa normal. No drainage or sinus tenderness. Throat: lips, mucosa, and tongue normal; teeth and gums normal and multiple carious teeth # 3-16, 18, 31, Impacted teeth 17, 21, cyst left maxilla. Pharynx clear Neck: no adenopathy, supple, symmetrical, trachea midline and thyroid not enlarged, symmetric, no tenderness/mass/nodules Resp: clear to auscultation bilaterally Cardio: regular rate and rhythm, S1, S2 normal, no murmur, click, rub or gallop  Assessment:Multiple nonrestorable teeth secondary to dental caries. Impacted teeth 17, 32. Cyst left maxilla  Plan: Multiple dental extractions, removal cyst left maxilla, possible bone graft. GA. Day surgery.   Diona Browner 05/25/2017

## 2017-05-26 NOTE — Progress Notes (Signed)
Anesthesia Chart Review:  Pt is a same day work up   Case:  [id number] Date/Time:  05/27/17 0915   Procedure:  MULTIPLE EXTRACTIONS (Bilateral )   Anesthesia type:  General   Pre-op diagnosis:  NONRESTORABLE   Location:  [facility] OR ROOM 12 / [facility]   Surgeon:  Diona Browner, DDS      DISCUSSION:  - Pt is a 37 year old female with hx lymphoma.  -  EKG 03/15/17 is abnormal shows inferolateral ischemia.  Discussed with Dr. Glennon Mac.  Will repeat EKG day of surgery and evaluate for active CV symptoms.    PROVIDERS: PCP is Augusto Gamble, DO in [place], [place]   LABS: Will be obtained day of surgery   IMAGES:  CXR 03/15/17: No active cardiopulmonary disease.   EKG 03/15/17:  - NSR. Incomplete RBBB. ST & T wave abnormality, consider inferolateral ischemia - Will repeat EKG day of surgery   CV:  Echo 07/23/15:   Normal left ventricular systolic function, ejection fraction 55 to 60%  Normal right ventricular systolic function  No significant valvular abnormalities   Past Medical History:  Diagnosis Date  . Asthma   . Cancer ([place])    lymphoma  . Dental caries    periodontitis, maxillary cyst  . Depression   . Diabetes mellitus without complication ([place])   . GERD (gastroesophageal reflux disease)   . Headache    migraines  . Hypertension   . Pneumonia    as a baby  . Seasonal allergies     Past Surgical History:  Procedure Laterality Date  . CHOLECYSTECTOMY    . LYMPH NODE BIOPSY    . PORT-A-CATH REMOVAL    . PORTACATH PLACEMENT      MEDICATIONS: No current facility-administered medications for this encounter.    Marland Kitchen albuterol (PROAIR HFA) 108 (90 Base) MCG/ACT inhaler  . aspirin-acetaminophen-caffeine (EXCEDRIN MIGRAINE) 250-250-65 MG tablet  . lisinopril-hydrochlorothiazide (PRINZIDE,ZESTORETIC) 20-25 MG tablet  . metFORMIN (GLUCOPHAGE) 500 MG tablet  . nitroGLYCERIN (NITROSTAT) 0.4 MG SL tablet  . pantoprazole (PROTONIX) 40 MG tablet  . ibuprofen (ADVIL,MOTRIN)  800 MG tablet  . ondansetron (ZOFRAN) 4 MG tablet    If no active CV symptoms and labs and EKG acceptable day of surgery, I anticipate pt can proceed with surgery as scheduled.  Willeen Cass, [name]-BC [facility]/Anesthesiology Phone: [phone] 05/26/2017 2:27 PM

## 2017-05-27 ENCOUNTER — Ambulatory Visit (HOSPITAL_COMMUNITY): Payer: Medicaid Other | Admitting: Emergency Medicine

## 2017-05-27 ENCOUNTER — Ambulatory Visit (HOSPITAL_COMMUNITY): Payer: Medicaid Other

## 2017-05-27 ENCOUNTER — Inpatient Hospital Stay (HOSPITAL_COMMUNITY)
Admission: RE | Admit: 2017-05-27 | Discharge: 2017-05-28 | DRG: 983 | Discharge status: Against Medical Advice | Payer: Medicaid Other | Source: Ambulatory Visit | Attending: Family Medicine | Admitting: Family Medicine

## 2017-05-27 ENCOUNTER — Encounter (HOSPITAL_COMMUNITY): Payer: Self-pay | Admitting: *Deleted

## 2017-05-27 ENCOUNTER — Encounter (HOSPITAL_COMMUNITY)
Admission: RE | Discharge status: Against Medical Advice | Payer: Self-pay | Source: Ambulatory Visit | Attending: Internal Medicine

## 2017-05-27 DIAGNOSIS — K029 Dental caries, unspecified: Secondary | ICD-10-CM | POA: Diagnosis present

## 2017-05-27 DIAGNOSIS — E119 Type 2 diabetes mellitus without complications: Secondary | ICD-10-CM | POA: Diagnosis not present

## 2017-05-27 DIAGNOSIS — Z9049 Acquired absence of other specified parts of digestive tract: Secondary | ICD-10-CM | POA: Diagnosis not present

## 2017-05-27 DIAGNOSIS — F17210 Nicotine dependence, cigarettes, uncomplicated: Secondary | ICD-10-CM | POA: Diagnosis present

## 2017-05-27 DIAGNOSIS — Z91040 Latex allergy status: Secondary | ICD-10-CM | POA: Diagnosis not present

## 2017-05-27 DIAGNOSIS — J181 Lobar pneumonia, unspecified organism: Secondary | ICD-10-CM | POA: Diagnosis not present

## 2017-05-27 DIAGNOSIS — E669 Obesity, unspecified: Secondary | ICD-10-CM | POA: Diagnosis present

## 2017-05-27 DIAGNOSIS — I160 Hypertensive urgency: Secondary | ICD-10-CM | POA: Diagnosis not present

## 2017-05-27 DIAGNOSIS — Z8249 Family history of ischemic heart disease and other diseases of the circulatory system: Secondary | ICD-10-CM | POA: Diagnosis not present

## 2017-05-27 DIAGNOSIS — K0530 Chronic periodontitis, unspecified: Secondary | ICD-10-CM | POA: Diagnosis present

## 2017-05-27 DIAGNOSIS — K011 Impacted teeth: Secondary | ICD-10-CM | POA: Diagnosis present

## 2017-05-27 DIAGNOSIS — Z801 Family history of malignant neoplasm of trachea, bronchus and lung: Secondary | ICD-10-CM

## 2017-05-27 DIAGNOSIS — Z6833 Body mass index (BMI) 33.0-33.9, adult: Secondary | ICD-10-CM

## 2017-05-27 DIAGNOSIS — K219 Gastro-esophageal reflux disease without esophagitis: Secondary | ICD-10-CM | POA: Diagnosis present

## 2017-05-27 DIAGNOSIS — Z7984 Long term (current) use of oral hypoglycemic drugs: Secondary | ICD-10-CM | POA: Diagnosis not present

## 2017-05-27 DIAGNOSIS — Z888 Allergy status to other drugs, medicaments and biological substances status: Secondary | ICD-10-CM | POA: Diagnosis not present

## 2017-05-27 DIAGNOSIS — J45909 Unspecified asthma, uncomplicated: Secondary | ICD-10-CM | POA: Diagnosis present

## 2017-05-27 DIAGNOSIS — Z8572 Personal history of non-Hodgkin lymphomas: Secondary | ICD-10-CM | POA: Diagnosis not present

## 2017-05-27 DIAGNOSIS — M2740 Unspecified cyst of jaw: Secondary | ICD-10-CM | POA: Diagnosis not present

## 2017-05-27 DIAGNOSIS — Z09 Encounter for follow-up examination after completed treatment for conditions other than malignant neoplasm: Secondary | ICD-10-CM

## 2017-05-27 DIAGNOSIS — F17200 Nicotine dependence, unspecified, uncomplicated: Secondary | ICD-10-CM | POA: Diagnosis present

## 2017-05-27 DIAGNOSIS — I10 Essential (primary) hypertension: Secondary | ICD-10-CM | POA: Diagnosis present

## 2017-05-27 HISTORY — DX: Headache, unspecified: R51.9

## 2017-05-27 HISTORY — DX: Other seasonal allergic rhinitis: J30.2

## 2017-05-27 HISTORY — DX: Pneumonia, unspecified organism: J18.9

## 2017-05-27 HISTORY — PX: TOOTH EXTRACTION: SHX859

## 2017-05-27 HISTORY — DX: Unspecified asthma, uncomplicated: J45.909

## 2017-05-27 HISTORY — DX: Depression, unspecified: F32.A

## 2017-05-27 HISTORY — DX: Hypertensive urgency: I16.0

## 2017-05-27 HISTORY — DX: Major depressive disorder, single episode, unspecified: F32.9

## 2017-05-27 HISTORY — DX: Dental caries, unspecified: K02.9

## 2017-05-27 HISTORY — DX: Headache: R51

## 2017-05-27 HISTORY — DX: Gastro-esophageal reflux disease without esophagitis: K21.9

## 2017-05-27 LAB — BASIC METABOLIC PANEL
Anion gap: 9 (ref 5–15)
BUN: 9 mg/dL (ref 6–20)
CALCIUM: 8.7 mg/dL — AB (ref 8.9–10.3)
CO2: 24 mmol/L (ref 22–32)
CREATININE: 0.68 mg/dL (ref 0.44–1.00)
Chloride: 107 mmol/L (ref 101–111)
GFR calc Af Amer: >60 mL/min (ref >60)
GFR calc non Af Amer: >60 mL/min (ref >60)
GLUCOSE: 160 mg/dL — AB (ref 65–99)
Potassium: 3.3 mmol/L — ABNORMAL LOW (ref 3.5–5.1)
Sodium: 140 mmol/L (ref 135–145)

## 2017-05-27 LAB — GLUCOSE, CAPILLARY
GLUCOSE-CAPILLARY: 168 mg/dL — AB (ref 65–99)
Glucose-Capillary: 189 mg/dL — ABNORMAL HIGH (ref 65–99)
Glucose-Capillary: 181 mg/dL — ABNORMAL HIGH (ref 65–99)
Glucose-Capillary: 220 mg/dL — ABNORMAL HIGH (ref 65–99)

## 2017-05-27 LAB — POCT PREGNANCY, URINE: Preg Test, Ur: NEGATIVE

## 2017-05-27 LAB — CBC
HCT: 44.5 % (ref 36.0–46.0)
Hemoglobin: 16 g/dL — ABNORMAL HIGH (ref 12.0–15.0)
MCH: 33.4 pg (ref 26.0–34.0)
MCHC: 36 g/dL (ref 30.0–36.0)
MCV: 92.9 fL (ref 78.0–100.0)
PLATELETS: 188 10*3/uL (ref 150–400)
RBC: 4.79 MIL/uL (ref 3.87–5.11)
RDW: 12.8 % (ref 11.5–15.5)
WBC: 8.5 10*3/uL (ref 4.0–10.5)

## 2017-05-27 LAB — HEMOGLOBIN A1C
HEMOGLOBIN A1C: 6.5 % — AB (ref 4.8–5.6)
Hgb A1c MFr Bld: 6.4 % — ABNORMAL HIGH (ref 4.8–5.6)
Mean Plasma Glucose: 139.85 mg/dL
Mean Plasma Glucose: 136.98 mg/dL

## 2017-05-27 SURGERY — DENTAL RESTORATION/EXTRACTIONS
Anesthesia: General | Laterality: Bilateral

## 2017-05-27 MED ORDER — ONDANSETRON HCL 4 MG/2ML IJ SOLN: 4.0000 mg | Freq: Four times a day (QID) | INTRAMUSCULAR | Status: DC | PRN

## 2017-05-27 MED ORDER — SUGAMMADEX SODIUM 200 MG/2ML IV SOLN
INTRAVENOUS | Status: AC
  Filled 2017-05-27: qty 2

## 2017-05-27 MED ORDER — INSULIN ASPART 100 UNIT/ML ~~LOC~~ SOLN: 0.0000 [IU] | Freq: Every day | SUBCUTANEOUS | Status: DC

## 2017-05-27 MED ORDER — LISINOPRIL 20 MG PO TABS
20.0000 mg | ORAL_TABLET | Freq: Every day | ORAL | Status: DC
  Administered 2017-05-27 – 2017-05-28 (×2): 20 mg via ORAL
  Filled 2017-05-27 (×2): qty 1

## 2017-05-27 MED ORDER — SODIUM CHLORIDE 0.9 % IR SOLN
Status: DC | PRN
  Administered 2017-05-27: 1000 mL

## 2017-05-27 MED ORDER — LIDOCAINE 2% (20 MG/ML) 5 ML SYRINGE
INTRAMUSCULAR | Status: AC
  Filled 2017-05-27: qty 5

## 2017-05-27 MED ORDER — AMOXICILLIN 500 MG PO CAPS: 500.0000 mg | ORAL_CAPSULE | Freq: Three times a day (TID) | ORAL | 0 refills | Status: DC

## 2017-05-27 MED ORDER — LABETALOL HCL 5 MG/ML IV SOLN
10.0000 mg | INTRAVENOUS | Status: DC | PRN
  Filled 2017-05-27: qty 4

## 2017-05-27 MED ORDER — OXYCODONE HCL 5 MG PO TABS
ORAL_TABLET | ORAL | Status: AC
  Administered 2017-05-27: 5 mg via ORAL
  Filled 2017-05-27: qty 1

## 2017-05-27 MED ORDER — LACTATED RINGERS IV SOLN
INTRAVENOUS | Status: DC
  Administered 2017-05-27 (×2): via INTRAVENOUS

## 2017-05-27 MED ORDER — MIDAZOLAM HCL 5 MG/5ML IJ SOLN
INTRAMUSCULAR | Status: DC | PRN
  Administered 2017-05-27: 2 mg via INTRAVENOUS

## 2017-05-27 MED ORDER — ALBUTEROL SULFATE (2.5 MG/3ML) 0.083% IN NEBU
INHALATION_SOLUTION | RESPIRATORY_TRACT | Status: AC
  Filled 2017-05-27: qty 3

## 2017-05-27 MED ORDER — PHENYLEPHRINE 40 MCG/ML (10ML) SYRINGE FOR IV PUSH (FOR BLOOD PRESSURE SUPPORT)
PREFILLED_SYRINGE | INTRAVENOUS | Status: DC | PRN
  Administered 2017-05-27 (×3): 80 ug via INTRAVENOUS

## 2017-05-27 MED ORDER — LABETALOL HCL 5 MG/ML IV SOLN
10.0000 mg | INTRAVENOUS | Status: AC | PRN
  Administered 2017-05-27 (×2): 10 mg via INTRAVENOUS

## 2017-05-27 MED ORDER — LIDOCAINE 2% (20 MG/ML) 5 ML SYRINGE
INTRAMUSCULAR | Status: DC | PRN
  Administered 2017-05-27: 60 mg via INTRAVENOUS

## 2017-05-27 MED ORDER — ONDANSETRON HCL 4 MG PO TABS: 4.0000 mg | ORAL_TABLET | Freq: Four times a day (QID) | ORAL | Status: DC | PRN

## 2017-05-27 MED ORDER — OXYCODONE-ACETAMINOPHEN 5-325 MG PO TABS: 1.0000 | ORAL_TABLET | ORAL | 0 refills | Status: DC | PRN

## 2017-05-27 MED ORDER — FENTANYL CITRATE (PF) 250 MCG/5ML IJ SOLN
INTRAMUSCULAR | Status: AC
  Filled 2017-05-27: qty 5

## 2017-05-27 MED ORDER — FENTANYL CITRATE (PF) 100 MCG/2ML IJ SOLN
25.0000 ug | INTRAMUSCULAR | Status: DC | PRN
  Administered 2017-05-27 (×4): 25 ug via INTRAVENOUS

## 2017-05-27 MED ORDER — INSULIN ASPART 100 UNIT/ML ~~LOC~~ SOLN
0.0000 [IU] | Freq: Three times a day (TID) | SUBCUTANEOUS | Status: DC
  Administered 2017-05-27: 2 [IU] via SUBCUTANEOUS

## 2017-05-27 MED ORDER — LIDOCAINE-EPINEPHRINE 2 %-1:100000 IJ SOLN
INTRAMUSCULAR | Status: AC
  Filled 2017-05-27: qty 1

## 2017-05-27 MED ORDER — HYDROCHLOROTHIAZIDE 25 MG PO TABS
25.0000 mg | ORAL_TABLET | Freq: Every day | ORAL | Status: DC
  Administered 2017-05-27 – 2017-05-28 (×2): 25 mg via ORAL
  Filled 2017-05-27 (×2): qty 1

## 2017-05-27 MED ORDER — KETOROLAC TROMETHAMINE 30 MG/ML IJ SOLN
30.0000 mg | Freq: Four times a day (QID) | INTRAMUSCULAR | Status: DC | PRN
  Administered 2017-05-27 – 2017-05-28 (×3): 30 mg via INTRAVENOUS
  Filled 2017-05-27 (×2): qty 1

## 2017-05-27 MED ORDER — FENTANYL CITRATE (PF) 100 MCG/2ML IJ SOLN
INTRAMUSCULAR | Status: AC
  Administered 2017-05-27: 25 ug via INTRAVENOUS
  Filled 2017-05-27: qty 2

## 2017-05-27 MED ORDER — OXYCODONE HCL 5 MG PO TABS
ORAL_TABLET | ORAL | Status: AC
  Filled 2017-05-27: qty 1

## 2017-05-27 MED ORDER — LABETALOL HCL 5 MG/ML IV SOLN
INTRAVENOUS | Status: AC
  Administered 2017-05-27: 10 mg via INTRAVENOUS
  Filled 2017-05-27: qty 4

## 2017-05-27 MED ORDER — METOPROLOL TARTRATE 5 MG/5ML IV SOLN
INTRAVENOUS | Status: DC | PRN
  Administered 2017-05-27: 1.5 mg via INTRAVENOUS

## 2017-05-27 MED ORDER — ROCURONIUM BROMIDE 10 MG/ML (PF) SYRINGE
PREFILLED_SYRINGE | INTRAVENOUS | Status: DC | PRN
  Administered 2017-05-27: 50 mg via INTRAVENOUS

## 2017-05-27 MED ORDER — KETOROLAC TROMETHAMINE 30 MG/ML IJ SOLN
INTRAMUSCULAR | Status: AC
  Administered 2017-05-27: 30 mg via INTRAVENOUS
  Filled 2017-05-27: qty 1

## 2017-05-27 MED ORDER — LISINOPRIL-HYDROCHLOROTHIAZIDE 20-25 MG PO TABS: 1.0000 | ORAL_TABLET | Freq: Every day | ORAL | Status: DC

## 2017-05-27 MED ORDER — OXYCODONE HCL 5 MG/5ML PO SOLN: 5.0000 mg | Freq: Once | ORAL | Status: AC | PRN

## 2017-05-27 MED ORDER — METOPROLOL TARTRATE 25 MG PO TABS: 25.0000 mg | ORAL_TABLET | Freq: Two times a day (BID) | ORAL | Status: DC

## 2017-05-27 MED ORDER — ALBUTEROL SULFATE (2.5 MG/3ML) 0.083% IN NEBU
2.5000 mg | INHALATION_SOLUTION | Freq: Four times a day (QID) | RESPIRATORY_TRACT | Status: DC
  Administered 2017-05-27: 2.5 mg via RESPIRATORY_TRACT
  Filled 2017-05-27: qty 3

## 2017-05-27 MED ORDER — SUGAMMADEX SODIUM 200 MG/2ML IV SOLN
INTRAVENOUS | Status: DC | PRN
  Administered 2017-05-27: 181 mg via INTRAVENOUS

## 2017-05-27 MED ORDER — PROPOFOL 10 MG/ML IV BOLUS
INTRAVENOUS | Status: DC | PRN
  Administered 2017-05-27: 180 mg via INTRAVENOUS

## 2017-05-27 MED ORDER — ALBUTEROL SULFATE (2.5 MG/3ML) 0.083% IN NEBU: 2.5000 mg | INHALATION_SOLUTION | Freq: Once | RESPIRATORY_TRACT | Status: AC

## 2017-05-27 MED ORDER — ALBUTEROL SULFATE (2.5 MG/3ML) 0.083% IN NEBU
2.5000 mg | INHALATION_SOLUTION | Freq: Once | RESPIRATORY_TRACT | Status: AC
  Administered 2017-05-27: 2.5 mg via RESPIRATORY_TRACT

## 2017-05-27 MED ORDER — MIDAZOLAM HCL 2 MG/2ML IJ SOLN
INTRAMUSCULAR | Status: AC
  Filled 2017-05-27: qty 2

## 2017-05-27 MED ORDER — PHENYLEPHRINE 40 MCG/ML (10ML) SYRINGE FOR IV PUSH (FOR BLOOD PRESSURE SUPPORT)
PREFILLED_SYRINGE | INTRAVENOUS | Status: AC
  Filled 2017-05-27: qty 20

## 2017-05-27 MED ORDER — SODIUM CHLORIDE 0.9 % IV SOLN
INTRAVENOUS | Status: AC | PRN
  Administered 2017-05-27: 1000 mL

## 2017-05-27 MED ORDER — PROPOFOL 10 MG/ML IV BOLUS
INTRAVENOUS | Status: AC
  Filled 2017-05-27: qty 20

## 2017-05-27 MED ORDER — LIDOCAINE-EPINEPHRINE 2 %-1:100000 IJ SOLN
INTRAMUSCULAR | Status: DC | PRN
  Administered 2017-05-27: 20 mL via INTRADERMAL

## 2017-05-27 MED ORDER — PANTOPRAZOLE SODIUM 40 MG PO TBEC: 40.0000 mg | DELAYED_RELEASE_TABLET | Freq: Every day | ORAL | Status: DC | PRN

## 2017-05-27 MED ORDER — ONDANSETRON HCL 4 MG/2ML IJ SOLN
INTRAMUSCULAR | Status: DC | PRN
  Administered 2017-05-27: 4 mg via INTRAVENOUS

## 2017-05-27 MED ORDER — FENTANYL CITRATE (PF) 100 MCG/2ML IJ SOLN
INTRAMUSCULAR | Status: DC | PRN
  Administered 2017-05-27: 100 ug via INTRAVENOUS
  Administered 2017-05-27: 50 ug via INTRAVENOUS

## 2017-05-27 MED ORDER — ALBUTEROL SULFATE (2.5 MG/3ML) 0.083% IN NEBU: 2.5000 mg | INHALATION_SOLUTION | Freq: Four times a day (QID) | RESPIRATORY_TRACT | Status: DC

## 2017-05-27 MED ORDER — DEXAMETHASONE SODIUM PHOSPHATE 10 MG/ML IJ SOLN
INTRAMUSCULAR | Status: DC | PRN
  Administered 2017-05-27: 4 mg via INTRAVENOUS

## 2017-05-27 MED ORDER — OXYCODONE HCL 5 MG PO TABS
5.0000 mg | ORAL_TABLET | Freq: Once | ORAL | Status: AC | PRN
  Administered 2017-05-27: 5 mg via ORAL

## 2017-05-27 MED ORDER — ALBUTEROL SULFATE (2.5 MG/3ML) 0.083% IN NEBU
INHALATION_SOLUTION | RESPIRATORY_TRACT | Status: AC
  Administered 2017-05-27: 2.5 mg via RESPIRATORY_TRACT
  Filled 2017-05-27: qty 3

## 2017-05-27 MED ORDER — OXYCODONE HCL 5 MG PO TABS
5.0000 mg | ORAL_TABLET | Freq: Once | ORAL | Status: AC
  Administered 2017-05-27: 5 mg via ORAL

## 2017-05-27 MED ORDER — HYDRALAZINE HCL 20 MG/ML IJ SOLN
INTRAMUSCULAR | Status: AC
  Filled 2017-05-27: qty 1

## 2017-05-27 MED ORDER — HYDRALAZINE HCL 20 MG/ML IJ SOLN
10.0000 mg | INTRAMUSCULAR | Status: AC | PRN
  Administered 2017-05-27 (×2): 10 mg via INTRAVENOUS

## 2017-05-27 MED ORDER — CEFAZOLIN SODIUM-DEXTROSE 2-4 GM/100ML-% IV SOLN
2.0000 g | INTRAVENOUS | Status: AC
  Administered 2017-05-27: 2 g via INTRAVENOUS
  Filled 2017-05-27: qty 100

## 2017-05-27 MED ORDER — DEXAMETHASONE SODIUM PHOSPHATE 10 MG/ML IJ SOLN
INTRAMUSCULAR | Status: AC
  Filled 2017-05-27: qty 1

## 2017-05-27 MED ORDER — HYDRALAZINE HCL 20 MG/ML IJ SOLN
INTRAMUSCULAR | Status: AC
  Administered 2017-05-27: 10 mg via INTRAVENOUS
  Filled 2017-05-27: qty 1

## 2017-05-27 MED ORDER — HYDRALAZINE HCL 20 MG/ML IJ SOLN
5.0000 mg | INTRAMUSCULAR | Status: DC
  Filled 2017-05-27: qty 1

## 2017-05-27 MED ORDER — METOPROLOL TARTRATE 5 MG/5ML IV SOLN
INTRAVENOUS | Status: AC
  Filled 2017-05-27: qty 5

## 2017-05-27 MED ORDER — HYDROMORPHONE HCL 1 MG/ML IJ SOLN
0.5000 mg | INTRAMUSCULAR | Status: DC | PRN
Start: 2017-05-27 — End: 2017-05-28

## 2017-05-27 SURGICAL SUPPLY — 35 items
BLADE SURG 15 STRL LF DISP TIS (BLADE) IMPLANT
BLADE SURG 15 STRL SS (BLADE)
BUR CROSS CUT FISSURE 1.6 (BURR) ×2 IMPLANT
BUR CROSS CUT FISSURE 1.6MM (BURR) ×1
BUR EGG ELITE 4.0 (BURR) ×2 IMPLANT
BUR EGG ELITE 4.0MM (BURR) ×2
BUR SURG 4X8 MED (BURR) IMPLANT
BURR SURG 4MMX8MM MEDIUM (BURR)
BURR SURG 4X8 MED (BURR)
CANISTER SUCT 3000ML PPV (MISCELLANEOUS) ×3 IMPLANT
COVER SURGICAL LIGHT HANDLE (MISCELLANEOUS) ×3 IMPLANT
GAUZE PACKING FOLDED 2  STR (GAUZE/BANDAGES/DRESSINGS) ×2
GAUZE PACKING FOLDED 2 STR (GAUZE/BANDAGES/DRESSINGS) ×1 IMPLANT
GLOVE BIOGEL PI IND STRL 7.0 (GLOVE) IMPLANT
GLOVE BIOGEL PI INDICATOR 7.0 (GLOVE) ×2
GLOVE SURG SS PI 6.5 STRL IVOR (GLOVE) ×2 IMPLANT
GLOVE SURG SS PI 7.0 STRL IVOR (GLOVE) ×2 IMPLANT
GLOVE SURG SS PI 7.5 STRL IVOR (GLOVE) ×2 IMPLANT
GOWN STRL REUS W/ TWL LRG LVL3 (GOWN DISPOSABLE) ×1 IMPLANT
GOWN STRL REUS W/ TWL XL LVL3 (GOWN DISPOSABLE) ×1 IMPLANT
GOWN STRL REUS W/TWL LRG LVL3 (GOWN DISPOSABLE) ×3
GOWN STRL REUS W/TWL XL LVL3 (GOWN DISPOSABLE) ×3
KIT BASIN OR (CUSTOM PROCEDURE TRAY) ×3 IMPLANT
KIT TURNOVER KIT B (KITS) ×3 IMPLANT
NEEDLE 22X1 1/2 (OR ONLY) (NEEDLE) ×6 IMPLANT
NS IRRIG 1000ML POUR BTL (IV SOLUTION) ×3 IMPLANT
PAD ARMBOARD 7.5X6 YLW CONV (MISCELLANEOUS) ×3 IMPLANT
PUTTY DBX 2.5CC (Putty) ×3 IMPLANT
PUTTY DBX 2.5CC DEPUY (Putty) IMPLANT
SPONGE SURGIFOAM ABS GEL 12-7 (HEMOSTASIS) IMPLANT
SUT CHROMIC 3 0 PS 2 (SUTURE) ×3 IMPLANT
SYR CONTROL 10ML LL (SYRINGE) ×3 IMPLANT
TRAY ENT MC OR (CUSTOM PROCEDURE TRAY) ×3 IMPLANT
TUBING IRRIGATION (MISCELLANEOUS) ×3 IMPLANT
YANKAUER SUCT BULB TIP NO VENT (SUCTIONS) ×3 IMPLANT

## 2017-05-27 NOTE — Anesthesia Preprocedure Evaluation (Signed)
Anesthesia Evaluation  Patient identified by MRN, date of birth, ID band Patient awake    Reviewed: Allergy & Precautions, H&P , NPO status , Patient's Chart, lab work & pertinent test results  Airway Mallampati: II   Neck ROM: full    Dental   Pulmonary asthma , Current Smoker,    breath sounds clear to auscultation       Cardiovascular hypertension,  Rhythm:regular Rate:Normal     Neuro/Psych  Headaches, PSYCHIATRIC DISORDERS Depression    GI/Hepatic GERD  ,  Endo/Other  diabetes, Type 2  Renal/GU      Musculoskeletal   Abdominal   Peds  Hematology   Anesthesia Other Findings   Reproductive/Obstetrics                             Anesthesia Physical Anesthesia Plan  ASA: II  Anesthesia Plan: General   Post-op Pain Management:    Induction: Intravenous  PONV Risk Score and Plan: 2 and Ondansetron, Dexamethasone, Midazolam and Treatment may vary due to age or medical condition  Airway Management Planned: Nasal ETT  Additional Equipment:   Intra-op Plan:   Post-operative Plan: Extubation in OR  Informed Consent: I have reviewed the patients History and Physical, chart, labs and discussed the procedure including the risks, benefits and alternatives for the proposed anesthesia with the patient or authorized representative who has indicated his/her understanding and acceptance.     Plan Discussed with: CRNA, Anesthesiologist and Surgeon  Anesthesia Plan Comments:         Anesthesia Quick Evaluation

## 2017-05-27 NOTE — Progress Notes (Signed)
Bridget Scott is a 37 y.o. female patient admitted from ED awake, alert - oriented  X 4 - no acute distress noted.  VSS - Blood pressure 127/71, pulse 87, temperature 98.4 F (36.9 C), temperature source Oral, resp. rate 15, height 5\' 5"  (1.651 m), weight 96.4 kg (212 lb 8.4 oz), last menstrual period 05/20/2017, SpO2 96 %.    IV in place, occlusive dsg intact without redness.  Orientation to room, and floor completed with information packet given to patient/family.  Patient declined safety video at this time.  Admission INP armband ID verified with patient/family, and in place.   SR up x 2, fall assessment complete, with patient and family able to verbalize understanding of risk associated with falls, and verbalized understanding to call nsg before up out of bed.  Call light within reach, patient able to voice, and demonstrate understanding.  Skin, clean-dry- intact without evidence of bruising, or skin tears.   No evidence of skin break down noted on exam.     Will cont to eval and treat per MD orders.  Luci Bank, RN 05/27/2017 5:58 PM

## 2017-05-27 NOTE — Op Note (Signed)
05/27/2017  9:48 AM  PATIENT:  Bridget Scott  37 y.o. female  PRE-OPERATIVE DIAGNOSIS:  NONRESTORABLE TEETH # 3, 4, 5, 6, 7, 8, 9, 10, 11, 12, 13, 14, 15, 16, 18, 31, IMPACTED TEETH 17, 32, CYST LEFT MAXILLA  POST-OPERATIVE DIAGNOSIS:  SAME  PROCEDURE:  Procedure(s): MULTIPLE EXTRACTIONS TEETH # 3, 4, 5, 6, 7, 8, 9, 10, 11, 12, 13, 14, 15, 16, 17, 18, 31, 32, ALVEOLOPLASTY RIGHT AND LEFT MAXILLA, REMOVAL CYST LEFT MAXILLA, BONE GRAFT LEFT MAXILLA  SURGEON:  Surgeon(s): Diona Browner, DDS  ANESTHESIA:   local and general  EBL:  minimal  DRAINS: none   SPECIMEN:  CYST LEFT MAXILLA  COUNTS:  YES  PLAN OF CARE: Discharge to home after PACU  PATIENT DISPOSITION:  PACU - hemodynamically stable.   PROCEDURE DETAILS: Dictation # [id number] Gae Bon, DMD 05/27/2017 9:48 AM

## 2017-05-27 NOTE — Anesthesia Procedure Notes (Signed)
Procedure Name: Intubation Date/Time: 05/27/2017 8:57 AM Performed by: Freddie Breech, CRNA Pre-anesthesia Checklist: Emergency Drugs available, Patient identified, Suction available, Patient being monitored and Timeout performed Patient Re-evaluated:Patient Re-evaluated prior to induction Oxygen Delivery Method: Circle system utilized Preoxygenation: Pre-oxygenation with 100% oxygen Induction Type: IV induction Ventilation: Mask ventilation without difficulty Laryngoscope Size: Mac and 3 Grade View: Grade I Nasal Tubes: Right, Nasal prep performed, Nasal Rae and Magill forceps- large, utilized Tube size: 7.0 mm Number of attempts: 1 Placement Confirmation: ETT inserted through vocal cords under direct vision,  positive ETCO2 and breath sounds checked- equal and bilateral Tube secured with: Tape

## 2017-05-27 NOTE — Progress Notes (Signed)
Dr. Marcie Bal at bedside, family updated at bedside with BP management concerns, Dr. Marcie Bal to consult [facility] for BP management.  Rowe Pavy, RN

## 2017-05-27 NOTE — Transfer of Care (Signed)
Immediate Anesthesia Transfer of Care Note  Patient: Bridget Scott  Procedure(s) Performed: MULTIPLE EXTRACTIONS (Bilateral )  Patient Location: PACU  Anesthesia Type:General  Level of Consciousness: alert  and patient cooperative  Airway & Oxygen Therapy: Patient Spontanous Breathing and Patient connected to face mask oxygen  Post-op Assessment: Report given to RN, Post -op Vital signs reviewed and stable and Patient moving all extremities X 4  Post vital signs: Reviewed and stable  Last Vitals:  Vitals Value Taken Time  BP 141/82 05/27/2017 10:02 AM  Temp    Pulse 93 05/27/2017 10:12 AM  Resp 20 05/27/2017 10:12 AM  SpO2 95 % 05/27/2017 10:12 AM  Vitals shown include unvalidated device data.  Last Pain:  Vitals:   05/27/17 0714  TempSrc:   PainSc: 10-Worst pain ever      Patients Stated Pain Goal: 6 (54/00/86 7619)  Complications: No apparent anesthesia complications

## 2017-05-27 NOTE — Progress Notes (Signed)
Rn holding hydralazine per dr Evangeline Gula pts BP 127/71 MD does not want BP to drop to fast. Verbal order given to d/c metoprolol from Dr. Evangeline Gula

## 2017-05-27 NOTE — H&P (Signed)
History and Physical    Bridget Scott UXN:[id number] DOB: 18-Aug-1980 DOA: 05/27/2017  PCP: Augusto Gamble, DO Patient coming from: home  Chief Complaint: hypertensive urgency  HPI: Bridget Scott is a 37 y.o. female with medical history significant difficult to control hypertension, diabetes, tobacco use, obesitydental caries status post complete extraction is admitted from postanesthesia care unit with hypertensive urgency.  Information is obtained from the patient and the husband who is at the bedside as well as the chart. Husband reports patient has a long history of difficult to control blood pressure. He states internal medicine physician has increased the dosage of her 1 antihypertensive meds with little effect. Patient denies any headache dizziness syncope or near-syncope. She denies chest pain palpitation shortness of breath. She does endorse some trace lower extremity edema on occasion. She denies abdominal pain nausea vomiting diarrhea constipation melena bright red blood per rectum. She reports compliance with medication and outpatient follow-up. She denies dysuria hematuria frequency or urgency. She denies diarrhea constipation melena bright red blood per rectum. She continues to smoke  Patient underwent complete tooth extraction and in the postanesthesia care unit blood pressure was 251/119. She is provided with IV labetalol 2 and IV Apresoline 2 as well as fentanyl and oral analgesia. At the time of admission blood pressure 222/107.   ED Course: see above  Review of Systems: As per HPI otherwise all other systems reviewed and are negative.   Ambulatory Status: ambulates independently is independent with ADLs  Past Medical History:  Diagnosis Date  . Asthma   . Cancer ([place])    lymphoma  . Dental caries    periodontitis, maxillary cyst  . Depression   . Diabetes mellitus without complication ([place])   . GERD (gastroesophageal reflux disease)   . Headache    migraines    . Hypertension   . Hypertensive urgency   . Pneumonia    as a baby  . Seasonal allergies     Past Surgical History:  Procedure Laterality Date  . CHOLECYSTECTOMY    . LYMPH NODE BIOPSY    . PORT-A-CATH REMOVAL    . PORTACATH PLACEMENT      Social History   Socioeconomic History  . Marital status: Married    Spouse name: Not on file  . Number of children: Not on file  . Years of education: Not on file  . Highest education level: Not on file  Occupational History  . Not on file  Social Needs  . Financial resource strain: Not on file  . Food insecurity:    Worry: Not on file    Inability: Not on file  . Transportation needs:    Medical: Not on file    Non-medical: Not on file  Tobacco Use  . Smoking status: Current Some Day Smoker    Packs/day: 2.00    Types: Cigarettes  . Smokeless tobacco: Never Used  Substance and Sexual Activity  . Alcohol use: No  . Drug use: No  . Sexual activity: Yes    Birth control/protection: None  Lifestyle  . Physical activity:    Days per week: Not on file    Minutes per session: Not on file  . Stress: Not on file  Relationships  . Social connections:    Talks on phone: Not on file    Gets together: Not on file    Attends religious service: Not on file    Active member of club or organization: Not on file  Attends meetings of clubs or organizations: Not on file    Relationship status: Not on file  . Intimate partner violence:    Fear of current or ex partner: Not on file    Emotionally abused: Not on file    Physically abused: Not on file    Forced sexual activity: Not on file  Other Topics Concern  . Not on file  Social History Narrative  . Not on file    Allergies  Allergen Reactions  . Analgesic Balm [Trolamine] Rash  . Bengay Pain Relief [Menthol] Rash  . Guaifenesin Hives, Rash, Other (See Comments) and Nausea And Vomiting    "Made me sick" (Robitussin)  . Latex Rash    Family History  Problem Relation  Age of Onset  . Lung cancer Mother   . Heart attack Father     Prior to Admission medications   Medication Sig Start Date End Date Taking? Authorizing Provider  albuterol (PROAIR HFA) 108 (90 Base) MCG/ACT inhaler Inhale 2 puffs into the lungs every 6 (six) hours as needed for wheezing or shortness of breath.   Yes [provider]  aspirin-acetaminophen-caffeine (EXCEDRIN MIGRAINE) [phone] MG tablet Take 1-2 tablets by mouth every 6 (six) hours as needed for headache or migraine.   Yes [provider]  lisinopril-hydrochlorothiazide (PRINZIDE,ZESTORETIC) 20-25 MG tablet Take 1 tablet by mouth daily. 03/15/17  Yes [name], [name], MD  metFORMIN (GLUCOPHAGE) 500 MG tablet Take 500 mg by mouth 3 (three) times daily.   Yes [provider]  nitroGLYCERIN (NITROSTAT) 0.4 MG SL tablet Place 0.4 mg under the tongue every 5 (five) minutes as needed for chest pain.  07/23/15 05/23/17 Yes [provider]  pantoprazole (PROTONIX) 40 MG tablet Take 40 mg by mouth daily as needed (for reflux symptoms).  06/06/15  Yes [provider]  amoxicillin (AMOXIL) 500 MG capsule Take 1 capsule (500 mg total) by mouth 3 (three) times daily. 05/27/17   Diona Browner, DDS  oxyCODONE-acetaminophen (PERCOCET) 5-325 MG tablet Take 1 tablet by mouth every 4 (four) hours as needed. 05/27/17   Diona Browner, DDS    Physical Exam: Vitals:   05/27/17 1445 05/27/17 1450 05/27/17 1500 05/27/17 1505  BP: (!) 235/102 (!) 235/102  (!) 222/107  Pulse: (!) 101 93 93 92  Resp: (!) 21 13 15 14   Temp:      TempSrc:      SpO2: 98% 95% 95% 95%  Weight:      Height:         General:  Appears calm and comfortable face quite swollen with ice packs to bilateral jaw Eyes:  PERRL, EOMI, normal lids, iris ENT:  grossly normal hearing, lips & tongue, mucus membranes of mouth mist with dried blood. Neck:  no LAD, masses or thyromegaly Cardiovascular:  RRR, no m/r/g. No LE edema.   Respiratory:  No increased work of breathing. Respirations slightly shallow. Fair air movement breath sounds somewhat coarse with faint end expiratory wheezing on expiration Abdomen:  soft, obese soft +BS no guarding or rebounding Skin:  no rash or induration seen on limited exam Musculoskeletal:  grossly normal tone BUE/BLE, good ROM, no bony abnormality Psychiatric:  grossly normal mood and affect, speech fluent and appropriate, AOx3 Neurologic:  CN 2-12 grossly intact, moves all extremities in coordinated fashion, sensation intact  Labs on Admission: I have personally reviewed following labs and imaging studies  CBC: Recent Labs  Lab 05/27/17 0719  WBC 8.5  HGB 16.0*  HCT 44.5  MCV 92.9  PLT 607   Basic Metabolic Panel: Recent Labs  Lab 05/27/17 0719  NA 140  K 3.3*  CL 107  CO2 24  GLUCOSE 160*  BUN 9  CREATININE 0.68  CALCIUM 8.7*   GFR: Estimated Creatinine Clearance: 109.1 mL/min (by C-G formula based on SCr of 0.68 mg/dL). Liver Function Tests: No results for input(s): AST, ALT, ALKPHOS, BILITOT, PROT, ALBUMIN in the last 168 hours. No results for input(s): LIPASE, AMYLASE in the last 168 hours. No results for input(s): AMMONIA in the last 168 hours. Coagulation Profile: No results for input(s): INR, PROTIME in the last 168 hours. Cardiac Enzymes: No results for input(s): CKTOTAL, CKMB, CKMBINDEX, TROPONINI in the last 168 hours. BNP (last 3 results) No results for input(s): PROBNP in the last 8760 hours. HbA1C: Recent Labs    05/27/17 0719  HGBA1C 6.5*   CBG: Recent Labs  Lab 05/27/17 0702 05/27/17 1001  GLUCAP 189* 181*   Lipid Profile: No results for input(s): CHOL, HDL, LDLCALC, TRIG, CHOLHDL, LDLDIRECT in the last 72 hours. Thyroid Function Tests: No results for input(s): TSH, T4TOTAL, FREET4, T3FREE, THYROIDAB in the last 72 hours. Anemia Panel: No results for input(s): VITAMINB12, FOLATE, FERRITIN, TIBC, IRON, RETICCTPCT in the last 72  hours. Urine analysis:    Component Value Date/Time   COLORURINE YELLOW 10/17/2015 0048   APPEARANCEUR HAZY (A) 10/17/2015 0048   APPEARANCEUR Clear 11/12/2012 1938   LABSPEC 1.018 10/17/2015 0048   LABSPEC 1.010 11/12/2012 1938   PHURINE 6.0 10/17/2015 0048   GLUCOSEU NEGATIVE 10/17/2015 0048   GLUCOSEU Negative 11/12/2012 1938   HGBUR NEGATIVE 10/17/2015 0048   BILIRUBINUR NEGATIVE 10/17/2015 0048   BILIRUBINUR Negative 11/12/2012 1938   KETONESUR NEGATIVE 10/17/2015 0048   PROTEINUR 100 (A) 10/17/2015 0048   UROBILINOGEN 1.0 08/26/2014 2340   NITRITE NEGATIVE 10/17/2015 0048   LEUKOCYTESUR NEGATIVE 10/17/2015 0048   LEUKOCYTESUR Negative 11/12/2012 1938    Creatinine Clearance: Estimated Creatinine Clearance: 109.1 mL/min (by C-G formula based on SCr of 0.68 mg/dL).  Sepsis Labs: @LABRCNTIP (procalcitonin:4,lacticidven:4) )No results found for this or any previous visit (from the past 240 hour(s)).   Radiological Exams on Admission: Dg Chest Port 1 View  Result Date: 05/27/2017 CLINICAL DATA:  Postoperative assessment. History of lymphoma. Hypertension. EXAM: PORTABLE CHEST 1 VIEW COMPARISON:  March 15, 2017 FINDINGS: There is focal consolidation in the medial left base. Lungs elsewhere are clear. Heart size is upper normal with pulmonary vascularity normal. No adenopathy. No pneumothorax. No bone lesions evident. IMPRESSION: Focal consolidation medial left base. Question focal pneumonia or possibly early aspiration. Lungs elsewhere clear. Stable cardiac silhouette. Electronically Signed   By: Lowella Grip III M.D.   On: 05/27/2017 11:48    EKG: personally reviewed :Normal sinus rhythm Possible Left atrial enlargement Nonspecific T wave abnormality   Assessment/Plan Principal Problem:   Hypertensive urgency Active Problems:   Hypertension   Diabetes mellitus without complication (HCC)   Asthma   GERD (gastroesophageal reflux disease)   Tobacco use disorder    #1. Hypertensive urgency. Likely related to an adequate blood pressure management in the setting of possible noncompliance with outpatient follow-up. -Admit to telemetry -Resume home medication of lisinopril and hydrochlorothiazide -Admit Toprol 25 mg by mouth twice a day -When necessary hydralazine -Adequate pain management -Monitor -of note, husband and patient quite frustrated with what they perceive as "terrible care, you need to fix her BP". I tried to explain likliehood that she will need several  agents and possibly many months to properly control BP. I emphasized need for close OP follow up.  #2. Diabetes. Serum glucose 160 on admission. Home medications include metformin -Hold metformin for now -Obtain a hemoglobin A1c -sliding scale insulin for optimal control  #3. GERD. Appears stable at baseline -Continue home meds  #4. Asthma/tobacco use. Home medications include albuterol inhaler. Chest x-ray with focal consolidation medial left base question focal pneumonia or possibly early aspiration otherwise lungs clear with a stable cardiac silhouette. Oxygen saturation level greater than 90% on room air. -nebulizer -monitor oxygen saturation level -oxygen supplementation as indicated   5. Tobacco use. -cessation counseling offered   DVT prophylaxis: scd  Code Status: full  Family Communication: none  Disposition Plan: home  Consults called: none will need different PCP  Admission status: inpatient   Radene Gunning MD [facility]  If 7PM-7AM, please contact night-coverage [email or web address] Password [facility]  05/27/2017, 3:45 PM

## 2017-05-27 NOTE — H&P (Signed)
H&P documentation  -History and Physical Reviewed  -Patient has been re-examined  -No change in the plan of care  Bridget Scott  

## 2017-05-27 NOTE — Op Note (Signed)
NAME: Bridget Scott, Bridget Scott MEDICAL RECORD PP:[id number] ACCOUNT [id number] DATE OF BIRTH:12-Oct-1980 FACILITY: [facility] LOCATION: [facility] PHYSICIAN:Yelena Metzer M. Kree Armato, DDS  OPERATIVE REPORT  DATE OF PROCEDURE:  05/27/2017  PREOPERATIVE DIAGNOSIS:  Nonrestorable teeth #3, 4, 5, 6, 7, 8, 9, 10, 11, 12, 13, 14, 15, 16, 18 and 31.  Impacted teeth #17 and 32.  Cyst, left maxilla.  POSTOPERATIVE DIAGNOSIS:  Nonrestorable teeth #3, 4, 5, 6, 7, 8, 9, 10, 11, 12, 13, 14, 15, 16, 18 and 31.  Impacted teeth #17 and 32.  Cyst, left maxilla.  PROCEDURE:  Extraction of teeth #3, 4, 5, 6, 7, 8, 9, 10, 11, 12, 13, 14, 15, 16, 17, 18, 31, 32, alveoplasty right and left maxilla, removal cyst left maxilla, bone graft to left maxilla.  SURGEON:  Jodene Nam, DDS.  ANESTHESIA:  General with nasal intubation, Dr. ____ attending.  DESCRIPTION OF PROCEDURE:  The patient was taken to the operating room and placed on the table in supine position.  General anesthesia was administered intravenously and a nasal endotracheal tube was placed and secured.  The eyes were protected and the  patient was draped for surgery.  A timeout was performed.  The posterior pharynx was suctioned and a throat pack was placed.  Two percent lidocaine 1:100,000 epinephrine was infiltrated and an inferior alveolar block on the right and left side and buccal  and palatal infiltration around the maxillary teeth.  A total of 17 mL was utilized.  A bite block was placed on the right side of the mouth and a sweetheart retractor was used to retract the tongue.  A #15 blade was used to make an incision overlying  tooth #17 carried forward in the gingival sulcus around tooth #18 and then released anteriorly approximately 1 cm.  The periosteum was reflected with a periosteal elevator.  Bone was removed from around tooth #17 and tooth #18.  Attempt was made to  remove tooth #18 with a 301 elevator and a forceps, but the tooth fractured necessitating  sectioning of the tooth.  The tooth was sectioned with the Stryker handpiece with the fissure bur and then additional bone was removed around tooth #17 and it was  also sectioned.  The 2 teeth were then removed with a 301 elevator.  The sockets were irrigated and then closed with 3-0 chromic.  Then, a 15 blade was used to make an incision beginning in tooth #16 carried forward in the gingival sulcus until tooth #6  was encountered.  This incision was created both buccally and palatally.  Then the periosteum was reflected from around these teeth.  The teeth were elevated with a 301 elevator.  Teeth numbers 16, 15, 14 and 13 were removed with dental forceps.  Tooth  #12 fractured upon attempted removal and therefore, additional bone was removed from around this tooth, the palatal and buccal roots specifically; and then the root tip pick and 301 elevator were used to remove the remainder of this tooth.  Then, teeth  numbers 11, 10, 9, 8 and 7 were removed with dental forceps.  The periosteum was further reflected to expose the alveolar crest and to expose the cyst of the left maxilla.  This was located between teeth numbers 10 and 11.  Buccal bone was extremely thin  in this area and fractured off as attempts were made to remove the cyst.  The dental curette was used to enucleate the cyst and then the cyst was removed and submitted for pathology.  Then,  an egg-shaped bur and bone file were used to perform  alveoplasty and then bone putty was used and injected in a pre-filled syringe into the cystic area.  An additional bone graft was placed in the dental sockets to help with contour restoration of the alveolus.  Then, the left maxilla was closed with 3-0  chromic interrupted and continuous interlocking sutures.  Then, the sweetheart retractor and bite block were repositioned at the other side of the mouth.  A 15 blade was used to make an incision around teeth #32 and carried forward around tooth #31.  The   periosteum was reflected.  Bone was removed from around 17 and 31.  Tooth #31 fractured upon attempted removal with a 301 elevator requiring sectioning.  The tooth was sectioned with a Stryker handpiece and fissure bur and then removed with a 301  elevator and then tooth #32 was removed by removing additional bone from around the tooth and then using a 301 elevator to luxate the tooth.  Then the sockets were curetted, irrigated, and closed with 3-0 chromic.  Then, the 15 blade was used to make an  incision around teeth numbers 3 through 6 buccally and palatally in the gingival sulcus.  The periosteum was reflected.  The teeth were elevated with a 301 elevator and removed from the mouth with dental forceps.  Tooth #5 fractured upon attempted  removal requiring removal of additional bone with the Stryker handpiece and a fissure bur around the buccal root.  The root tip pick and hemostat was used to remove this small fragment of root.  Then, the periosteum was reflected in the right maxilla to  expose the alveolar crest and alveoplasty was performed using an egg-shaped bur and bone file.  The areas were irrigated and closed with 3-0 chromic.  Then, the oral cavity was irrigated and suctioned.  Throat pack was removed.  The patient was awakened,  left in the care of anesthesia for extubation and transport to recovery room with plans for discharge home through day surgery.  ESTIMATED BLOOD LOSS:  Minimal.  COMPLICATIONS:  None.  SPECIMEN:  Cyst, left maxilla.  TN/NUANCE  D:05/27/2017 T:05/27/2017 JOB:[id number]

## 2017-05-27 NOTE — Anesthesia Postprocedure Evaluation (Signed)
Anesthesia Post Note  Patient: Bridget Scott  Procedure(s) Performed: MULTIPLE EXTRACTIONS (Bilateral )     Patient location during evaluation: PACU Anesthesia Type: General Level of consciousness: awake and alert Pain management: pain level controlled Vital Signs Assessment: post-procedure vital signs reviewed and stable Respiratory status: spontaneous breathing, nonlabored ventilation, respiratory function stable and patient connected to nasal cannula oxygen Cardiovascular status: blood pressure returned to baseline and stable Postop Assessment: no apparent nausea or vomiting Anesthetic complications: no    Last Vitals:  Vitals:   05/27/17 1220 05/27/17 1235  BP: (!) 214/123 (!) 192/120  Pulse: 87 89  Resp: 16 13  Temp:    SpO2: 93% 93%    Last Pain:  Vitals:   05/27/17 1235  TempSrc:   PainSc: 6                  Roylee Chaffin S

## 2017-05-28 LAB — BASIC METABOLIC PANEL
Anion gap: 12 (ref 5–15)
BUN: 9 mg/dL (ref 6–20)
CALCIUM: 8.7 mg/dL — AB (ref 8.9–10.3)
CO2: 24 mmol/L (ref 22–32)
CREATININE: 0.58 mg/dL (ref 0.44–1.00)
Chloride: 102 mmol/L (ref 101–111)
GFR calc non Af Amer: >60 mL/min (ref >60)
Glucose, Bld: 131 mg/dL — ABNORMAL HIGH (ref 65–99)
Potassium: 3.1 mmol/L — ABNORMAL LOW (ref 3.5–5.1)
Sodium: 138 mmol/L (ref 135–145)

## 2017-05-28 LAB — CBC
HCT: 37.9 % (ref 36.0–46.0)
Hemoglobin: 12.8 g/dL (ref 12.0–15.0)
MCH: 31.1 pg (ref 26.0–34.0)
MCHC: 33.8 g/dL (ref 30.0–36.0)
MCV: 92 fL (ref 78.0–100.0)
PLATELETS: 179 10*3/uL (ref 150–400)
RBC: 4.12 MIL/uL (ref 3.87–5.11)
RDW: 12.8 % (ref 11.5–15.5)
WBC: 12.5 10*3/uL — ABNORMAL HIGH (ref 4.0–10.5)

## 2017-05-28 LAB — GLUCOSE, CAPILLARY: GLUCOSE-CAPILLARY: 121 mg/dL — AB (ref 65–99)

## 2017-05-28 LAB — HIV ANTIBODY (ROUTINE TESTING W REFLEX): HIV SCREEN 4TH GENERATION: NONREACTIVE

## 2017-05-28 MED ORDER — ALBUTEROL SULFATE (2.5 MG/3ML) 0.083% IN NEBU
2.5000 mg | INHALATION_SOLUTION | Freq: Three times a day (TID) | RESPIRATORY_TRACT | Status: DC
  Administered 2017-05-28: 2.5 mg via RESPIRATORY_TRACT
  Filled 2017-05-28: qty 3

## 2017-05-28 MED ORDER — POTASSIUM CHLORIDE CRYS ER 20 MEQ PO TBCR
40.0000 meq | EXTENDED_RELEASE_TABLET | Freq: Once | ORAL | Status: AC
  Administered 2017-05-28: 40 meq via ORAL
  Filled 2017-05-28: qty 2

## 2017-05-28 MED ORDER — ALBUTEROL SULFATE (2.5 MG/3ML) 0.083% IN NEBU: 2.5000 mg | INHALATION_SOLUTION | Freq: Two times a day (BID) | RESPIRATORY_TRACT | Status: DC

## 2017-05-28 NOTE — Progress Notes (Signed)
Pt. IV d/c'd, tele removed and advised about AMA. Pt. Discharging back to home via significant other.

## 2017-05-28 NOTE — Discharge Summary (Signed)
I Arrived the floor and I was advised that patient left AGAINST MEDICAL ADVICE prior to being seen today   Please see H&P dictated 05/27/2017 for details of patient's hospital stay  Patient left AMA on 05/28/2017 Roxan Hockey, MD

## 2017-05-28 NOTE — Plan of Care (Signed)
  Problem: Pain Managment: Goal: General experience of comfort will improve Outcome: Progressing Note:  Pain managed with iv toradol Bp within normal range held hydrazaline

## 2017-05-28 NOTE — Progress Notes (Signed)
Pt. Requesting to leave AMA. MD notified.

## 2017-05-30 ENCOUNTER — Encounter (HOSPITAL_COMMUNITY): Payer: Self-pay | Admitting: Oral Surgery

## 2017-10-27 ENCOUNTER — Emergency Department
Admission: EM | Admit: 2017-10-27 | Discharge: 2017-10-27 | Disposition: A | Discharge status: Home / Self Care | Payer: Medicaid Other | Attending: Emergency Medicine | Admitting: Emergency Medicine

## 2017-10-27 ENCOUNTER — Emergency Department: Payer: Medicaid Other

## 2017-10-27 ENCOUNTER — Encounter: Payer: Self-pay | Admitting: Emergency Medicine

## 2017-10-27 DIAGNOSIS — J45909 Unspecified asthma, uncomplicated: Secondary | ICD-10-CM | POA: Diagnosis not present

## 2017-10-27 DIAGNOSIS — Z7984 Long term (current) use of oral hypoglycemic drugs: Secondary | ICD-10-CM | POA: Insufficient documentation

## 2017-10-27 DIAGNOSIS — E119 Type 2 diabetes mellitus without complications: Secondary | ICD-10-CM | POA: Diagnosis not present

## 2017-10-27 DIAGNOSIS — Z79899 Other long term (current) drug therapy: Secondary | ICD-10-CM | POA: Diagnosis not present

## 2017-10-27 DIAGNOSIS — M25462 Effusion, left knee: Secondary | ICD-10-CM

## 2017-10-27 DIAGNOSIS — M25562 Pain in left knee: Secondary | ICD-10-CM | POA: Diagnosis present

## 2017-10-27 DIAGNOSIS — F17210 Nicotine dependence, cigarettes, uncomplicated: Secondary | ICD-10-CM | POA: Diagnosis not present

## 2017-10-27 DIAGNOSIS — I10 Essential (primary) hypertension: Secondary | ICD-10-CM | POA: Diagnosis not present

## 2017-10-27 MED ORDER — MELOXICAM 15 MG PO TABS: 15.0000 mg | ORAL_TABLET | Freq: Every day | ORAL | 2 refills | Status: AC

## 2017-10-27 NOTE — ED Triage Notes (Signed)
Pt reports over stretched her left knee a week ago and has been applying heat and ice with no relief. No obvious swelling or deformities noted.

## 2017-10-27 NOTE — ED Triage Notes (Signed)
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 Pt reports has also been on chemo for hodgkins lymphoma with the last treatment being 4 years ago. Pt with HTN in triage, reports has not taken her meds today.

## 2017-10-27 NOTE — ED Provider Notes (Signed)
[facility] Emergency Department Provider Note  ____________________________________________   First MD Initiated Contact with Patient 10/27/17 1005     (approximate)  I have reviewed the triage vital signs and the nursing notes.   HISTORY  Chief Complaint Knee Pain    HPI Bridget Scott is a 37 y.o. female presents emergency department complaining of left knee pain.  She states that she was stretching her left knee a week ago and had some swelling and pain afterwards.  She states she still continues to have pain and it hurts to extend her leg.  No other injury.  No chest pain or shortness of breath.    Past Medical History:  Diagnosis Date  . Asthma   . Cancer ([place])    lymphoma  . Dental caries    periodontitis, maxillary cyst  . Depression   . Diabetes mellitus without complication ([place])   . GERD (gastroesophageal reflux disease)   . Headache    migraines  . Hypertension   . Hypertensive urgency   . Pneumonia    as a baby  . Seasonal allergies     Patient Active Problem List   Diagnosis Date Noted  . Tobacco use disorder 05/27/2017  . Hypertension   . Diabetes mellitus without complication ([place])   . Asthma   . GERD (gastroesophageal reflux disease)   . Hypertensive urgency     Past Surgical History:  Procedure Laterality Date  . CHOLECYSTECTOMY    . LYMPH NODE BIOPSY    . PORT-A-CATH REMOVAL    . PORTACATH PLACEMENT    . TOOTH EXTRACTION Bilateral 05/27/2017   Procedure: MULTIPLE EXTRACTIONS;  Surgeon: Diona Browner, DDS;  Location: [place];  Service: Oral Surgery;  Laterality: Bilateral;    Prior to Admission medications   Medication Sig Start Date End Date Taking? Authorizing Provider  albuterol (PROAIR HFA) 108 (90 Base) MCG/ACT inhaler Inhale 2 puffs into the lungs every 6 (six) hours as needed for wheezing or shortness of breath.    [provider]  amoxicillin (AMOXIL) 500 MG capsule Take 1 capsule (500 mg total)  by mouth 3 (three) times daily. 05/27/17   Diona Browner, DDS  lisinopril-hydrochlorothiazide (PRINZIDE,ZESTORETIC) 20-25 MG tablet Take 1 tablet by mouth daily. 03/15/17   [name], [name], MD  meloxicam (MOBIC) 15 MG tablet Take 1 tablet (15 mg total) by mouth daily. 10/27/17 10/27/18  Rajah Tagliaferro, Linden Dolin, [name]  metFORMIN (GLUCOPHAGE) 500 MG tablet Take 500 mg by mouth 3 (three) times daily.    [provider]  nitroGLYCERIN (NITROSTAT) 0.4 MG SL tablet Place 0.4 mg under the tongue every 5 (five) minutes as needed for chest pain.  07/23/15 05/23/17  [provider]  oxyCODONE-acetaminophen (PERCOCET) 5-325 MG tablet Take 1 tablet by mouth every 4 (four) hours as needed. 05/27/17   Diona Browner, DDS  pantoprazole (PROTONIX) 40 MG tablet Take 40 mg by mouth daily as needed (for reflux symptoms).  06/06/15   [provider]    Allergies Analgesic balm [trolamine]; Bengay pain relief [menthol]; Guaifenesin; and Latex  Family History  Problem Relation Age of Onset  . Lung cancer Mother   . Heart attack Father     Social History Social History   Tobacco Use  . Smoking status: Current Some Day Smoker    Packs/day: 2.00    Types: Cigarettes  . Smokeless tobacco: Never Used  Substance Use Topics  . Alcohol use: No  . Drug use: No  Review of Systems  Constitutional: No fever/chills Eyes: No visual changes. ENT: No sore throat. Respiratory: Denies cough Genitourinary: Negative for dysuria. Musculoskeletal: Negative for back pain.  Positive for left knee pain Skin: Negative for rash.    ____________________________________________   PHYSICAL EXAM:  VITAL SIGNS: ED Triage Vitals [10/27/17 0911]  Enc Vitals Group     BP      Pulse Rate 70     Resp 20     Temp 98.4 F (36.9 C)     Temp Source Oral     SpO2 98 %     Weight 209 lb (94.8 kg)     Height 5\' 5"  (1.651 m)     Head Circumference      Peak Flow      Pain Score 8     Pain Loc      Pain  Edu?      Excl. in [place]?     Constitutional: Alert and oriented. Well appearing and in no acute distress. Eyes: Conjunctivae are normal.  Head: Atraumatic. Nose: No congestion/rhinnorhea. Mouth/Throat: Mucous membranes are moist.   Neck:  supple no lymphadenopathy noted Cardiovascular: Normal rate, regular rhythm. Heart sounds are normal Respiratory: Normal respiratory effort.  No retractions, lungs c t a  GU: deferred Musculoskeletal: FROM all extremities, warm and well perfused.  Left knee is tender at the joint line.  Neurovascular is intact.  No calf tenderness.  Negative Homans sign. Neurologic:  Normal speech and language.  Skin:  Skin is warm, dry and intact. No rash noted. Psychiatric: Mood and affect are normal. Speech and behavior are normal.  ____________________________________________   LABS (all labs ordered are listed, but only abnormal results are displayed)  Labs Reviewed - No data to display ____________________________________________   ____________________________________________  RADIOLOGY  X-ray of the left knee shows a small joint effusion  ____________________________________________   PROCEDURES  Procedure(s) performed: Knee immobilizer applied by nursing staff  Procedures    ____________________________________________   INITIAL IMPRESSION / ASSESSMENT AND PLAN / ED COURSE  Pertinent labs & imaging results that were available during my care of the patient were reviewed by me and considered in my medical decision making (see chart for details).   Patient is a 37 year old female presents emergency department complaining of left knee pain.  Physical exam left knee is tender at the joint line and posteriorly.  Calf is not tender.  Negative Homans sign.  X-ray of the left knee shows a small joint effusion  Explained the results to the patient.  She was given a knee immobilizer.  Prescription for meloxicam 15 mg daily.  She is to add Tylenol  as needed for pain.  Ice and elevate the knee.  She states she understands will comply.  She is to follow-up with orthopedics or her regular doctor if not better in 5 7 days.  She was discharged in stable condition.     As part of my medical decision making, I reviewed the following data within the [place] notes reviewed and incorporated, Old chart reviewed, Radiograph reviewed x-ray of the left knee is positive for joint effusion, Notes from prior ED visits and [place] Controlled Substance Database  ____________________________________________   FINAL CLINICAL IMPRESSION(S) / ED DIAGNOSES  Final diagnoses:  Effusion of left knee      NEW MEDICATIONS STARTED DURING THIS VISIT:  New Prescriptions   MELOXICAM (MOBIC) 15 MG TABLET    Take 1 tablet (15 mg total) by  mouth daily.     Note:  This document was prepared using Dragon voice recognition software and may include unintentional dictation errors.    Versie Starks, [name] 10/27/17 1046    Lavonia Drafts, MD 10/27/17 1341

## 2017-10-27 NOTE — Discharge Instructions (Addendum)
Follow up with your regular doctor if not better in 1 week or call orthopedics for an appt.  Dr Serita Grit phone number is attached.  Take mobic daily for at least 1 week.  Apply ice to the knee.  Return if worsening

## 2017-12-09 DIAGNOSIS — Z124 Encounter for screening for malignant neoplasm of cervix: Secondary | ICD-10-CM | POA: Diagnosis not present

## 2017-12-09 DIAGNOSIS — Z3009 Encounter for other general counseling and advice on contraception: Secondary | ICD-10-CM | POA: Diagnosis not present

## 2017-12-09 DIAGNOSIS — R87810 Cervical high risk human papillomavirus (HPV) DNA test positive: Secondary | ICD-10-CM | POA: Diagnosis not present

## 2018-01-02 DIAGNOSIS — J069 Acute upper respiratory infection, unspecified: Secondary | ICD-10-CM | POA: Diagnosis not present

## 2018-01-24 DIAGNOSIS — J069 Acute upper respiratory infection, unspecified: Secondary | ICD-10-CM | POA: Diagnosis not present

## 2018-01-24 DIAGNOSIS — I10 Essential (primary) hypertension: Secondary | ICD-10-CM | POA: Diagnosis not present

## 2018-01-24 DIAGNOSIS — R05 Cough: Secondary | ICD-10-CM | POA: Diagnosis not present

## 2018-02-08 DIAGNOSIS — B9689 Other specified bacterial agents as the cause of diseases classified elsewhere: Secondary | ICD-10-CM | POA: Diagnosis not present

## 2018-02-08 DIAGNOSIS — J019 Acute sinusitis, unspecified: Secondary | ICD-10-CM | POA: Diagnosis not present

## 2018-02-08 DIAGNOSIS — R05 Cough: Secondary | ICD-10-CM | POA: Diagnosis not present

## 2018-02-08 DIAGNOSIS — J40 Bronchitis, not specified as acute or chronic: Secondary | ICD-10-CM | POA: Diagnosis not present

## 2018-05-10 DIAGNOSIS — C8190 Hodgkin lymphoma, unspecified, unspecified site: Secondary | ICD-10-CM | POA: Diagnosis not present

## 2018-05-11 DIAGNOSIS — C8190 Hodgkin lymphoma, unspecified, unspecified site: Secondary | ICD-10-CM | POA: Diagnosis not present

## 2018-05-11 DIAGNOSIS — J019 Acute sinusitis, unspecified: Secondary | ICD-10-CM | POA: Diagnosis not present

## 2018-05-24 DIAGNOSIS — G8929 Other chronic pain: Secondary | ICD-10-CM | POA: Diagnosis not present

## 2018-05-24 DIAGNOSIS — E118 Type 2 diabetes mellitus with unspecified complications: Secondary | ICD-10-CM | POA: Diagnosis not present

## 2018-05-24 DIAGNOSIS — B373 Candidiasis of vulva and vagina: Secondary | ICD-10-CM | POA: Diagnosis not present

## 2018-05-24 DIAGNOSIS — Z720 Tobacco use: Secondary | ICD-10-CM | POA: Diagnosis not present

## 2018-05-24 DIAGNOSIS — Z794 Long term (current) use of insulin: Secondary | ICD-10-CM | POA: Diagnosis not present

## 2018-05-24 DIAGNOSIS — I10 Essential (primary) hypertension: Secondary | ICD-10-CM | POA: Diagnosis not present

## 2018-05-24 DIAGNOSIS — G4709 Other insomnia: Secondary | ICD-10-CM | POA: Diagnosis not present

## 2018-05-24 DIAGNOSIS — F419 Anxiety disorder, unspecified: Secondary | ICD-10-CM | POA: Diagnosis not present

## 2018-05-24 DIAGNOSIS — K219 Gastro-esophageal reflux disease without esophagitis: Secondary | ICD-10-CM | POA: Diagnosis not present

## 2018-08-17 DIAGNOSIS — Z3200 Encounter for pregnancy test, result unknown: Secondary | ICD-10-CM | POA: Diagnosis not present

## 2018-11-07 ENCOUNTER — Encounter: Payer: Self-pay | Admitting: Obstetrics and Gynecology

## 2018-11-07 NOTE — Progress Notes (Deleted)
Last pap? Sti Testing? Flu Shot?

## 2018-11-16 ENCOUNTER — Other Ambulatory Visit: Payer: Self-pay

## 2018-11-16 ENCOUNTER — Ambulatory Visit (INDEPENDENT_AMBULATORY_CARE_PROVIDER_SITE_OTHER): Payer: Medicaid Other | Admitting: Obstetrics and Gynecology

## 2018-11-16 ENCOUNTER — Encounter: Payer: Self-pay | Admitting: Obstetrics and Gynecology

## 2018-11-16 VITALS — BP 172/108 | HR 72 | Wt 216.0 lb

## 2018-11-16 DIAGNOSIS — Z3202 Encounter for pregnancy test, result negative: Secondary | ICD-10-CM

## 2018-11-16 DIAGNOSIS — N912 Amenorrhea, unspecified: Secondary | ICD-10-CM | POA: Diagnosis not present

## 2018-11-16 LAB — POCT URINE PREGNANCY: Preg Test, Ur: NEGATIVE

## 2018-11-16 NOTE — Progress Notes (Signed)
Patient is here to est care with our practice. 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  History of HBP- Did not take medication today.

## 2018-11-16 NOTE — Progress Notes (Signed)
Obstetrics and Gynecology New Patient Evaluation  Appointment Date: 11/16/2018  OBGYN Clinic: [facility]  Primary Care Provider: Augusto Gamble  Referring Provider: Augusto Gamble, DO  Chief Complaint: amenorrhea since June 2020 History of Present Illness: Bridget Scott is a 38 y.o. Caucasian G2P0110 (Patient's last menstrual period was 06/26/2018.), seen for the above chief complaint. 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    No breast s/s, hot flashes, night sweats, vaginal dryness, fevers, chills, chest pain, SOB, nausea, vomiting, abdominal pain, dysuria, hematuria, vaginal itching or discharge, dyspareunia, diarrhea, constipation, blood in BMs  Review of Systems: as noted in the History of Present Illness.  Patient Active Problem List   Diagnosis Date Noted  . Amenorrhea 11/16/2018  . Tobacco use disorder 05/27/2017  . Hypertension   . Diabetes mellitus without complication ([place])   . Asthma   . GERD (gastroesophageal reflux disease)   . Hypertensive urgency     Past Medical History:  Past Medical History:  Diagnosis Date  . Asthma   . Cancer ([place])    lymphoma  . Dental caries    periodontitis, maxillary cyst  . Depression   . Diabetes mellitus without complication ([place])   . GERD (gastroesophageal reflux disease)   . Headache    migraines  . Hodgkin lymphoma ([place])    in remission as of 2020. followed by [facility]  . Hypertension   . Hypertensive urgency   . Pneumonia    as a baby  . Seasonal allergies     Past Surgical History:  Past Surgical History:  Procedure Laterality Date  . CHOLECYSTECTOMY    . LYMPH NODE BIOPSY    . PORT-A-CATH REMOVAL    . PORTACATH PLACEMENT    . TOOTH EXTRACTION Bilateral 05/27/2017   Procedure: MULTIPLE EXTRACTIONS;  Surgeon: Diona Browner, DDS;  Location: [place];  Service: Oral Surgery;  Laterality: Bilateral;    Past  Obstetrical History:  OB History  Gravida Para Term Preterm AB Living  2 1   1 1  0  SAB TAB Ectopic Multiple Live Births  1       0    # Outcome Date GA Lbr Len/2nd Weight Sex Delivery Anes PTL Lv  2 SAB 2015          1 Preterm 2001 [redacted]w[redacted]d           Obstetric Comments  G1: PPROM, PTL    Past Gynecological History: As per HPI. Periods: states prior to chemo in 2013 for her CHL her periods were irregular and would sometimes go a few months w/o a period. She then had normal, regular, monthly periods after her chemo with periods lasting 3-5d but were painful.  History of Pap Smear(s): Yes.   Last pap 2019, which was negative and hpv cleared up per patient.  She is currently using no method for contraception.  History of HRT use: No.  Social History:  Social History   Socioeconomic History  . Marital status: Married    Spouse name: Not on file  . Number of children: Not on file  . Years of education: Not on file  . Highest education level: Not on file  Occupational History  . Not on file  Social Needs  . Financial resource strain: Not on file  . Food insecurity    Worry: Not on file    Inability: Not on file  . Transportation needs  Medical: Not on file    Non-medical: Not on file  Tobacco Use  . Smoking status: Current Some Day Smoker    Packs/day: 2.00    Types: Cigarettes  . Smokeless tobacco: Never Used  Substance and Sexual Activity  . Alcohol use: No  . Drug use: No  . Sexual activity: Yes    Birth control/protection: None  Lifestyle  . Physical activity    Days per week: Not on file    Minutes per session: Not on file  . Stress: Not on file  Relationships  . Social Herbalist on phone: Not on file    Gets together: Not on file    Attends religious service: Not on file    Active member of club or organization: Not on file    Attends meetings of clubs or organizations: Not on file    Relationship status: Not on file  . Intimate partner  violence    Fear of current or ex partner: Not on file    Emotionally abused: Not on file    Physically abused: Not on file    Forced sexual activity: Not on file  Other Topics Concern  . Not on file  Social History Narrative  . Not on file    Family History:  Family History  Problem Relation Age of Onset  . Lung cancer Mother   . Heart attack Father     Medications Yates Decamp. Azzarelli had no medications administered during this visit. Current Outpatient Medications  Medication Sig Dispense Refill  . albuterol (PROAIR HFA) 108 (90 Base) MCG/ACT inhaler Inhale 2 puffs into the lungs every 6 (six) hours as needed for wheezing or shortness of breath.    . lisinopril-hydrochlorothiazide (PRINZIDE,ZESTORETIC) 20-25 MG tablet Take 1 tablet by mouth daily. 30 tablet 0  . metFORMIN (GLUCOPHAGE) 500 MG tablet Take 500 mg by mouth 3 (three) times daily.    . nitroGLYCERIN (NITROSTAT) 0.4 MG SL tablet Place 0.4 mg under the tongue every 5 (five) minutes as needed for chest pain.    . pantoprazole (PROTONIX) 40 MG tablet Take 40 mg by mouth daily as needed (for reflux symptoms).      No current facility-administered medications for this visit.     Allergies Analgesic balm [trolamine], Bengay pain relief [menthol], Guaifenesin, and Latex   Physical Exam:  BP (!) 172/108   Pulse 72   Wt 216 lb (98 kg)   LMP 06/26/2018   BMI 35.94 kg/m  Body mass index is 35.94 kg/m. General appearance: Well nourished, well developed female in no acute distress.  Neck:  Supple, normal appearance, and no thyromegaly  Cardiovascular: normal s1 and s2.  No murmurs, rubs or gallops. Respiratory:  Clear to auscultation bilateral. Normal respiratory effort Abdomen: positive bowel sounds and no masses, hernias; diffusely non tender to palpation, non distended Neuro/Psych:  Normal mood and affect.  Skin:  Warm and dry.  Lymphatic:  No inguinal lymphadenopathy.   Pelvic exam: is not limited by body  habitus EGBUS: within normal limits, Vagina: within normal limits and with no blood or discharge in the vault, Cervix: normal appearing cervix without tenderness, discharge or lesions. Uterus:  nonenlarged and non tender and Adnexa:  normal adnexa and no mass, fullness, tenderness Rectovaginal: deferred  Laboratory: UPT negative  Radiology: none  Assessment: pt stable  Plan:  1. Amenorrhea Patient denies any radiation with her treatments and states the cancer was all above the pelvis. I told her  that any chemo has the potential to affect her ovarian reserve and fertility but it would be unusual that it would be such a long latency period from chemo completion but it is possible. I told her labs aren't conclusive that I recommend a provera challenge test. Patient isn't sure about wanting to try for kids in the future and is just more so curious as to why she's amenorrheic  - POCT urine pregnancy - Follicle stimulating hormone - Estradiol - Anti mullerian hormone - TSH - Prolactin - Beta hCG quant (ref lab) - POCT urine pregnancy  2. GYN Will have her sign request for [facility] pap smear results  RTC 2-3wks   Durene Romans MD Attending [facility])

## 2018-11-17 ENCOUNTER — Encounter: Payer: Self-pay | Admitting: *Deleted

## 2018-11-21 LAB — FOLLICLE STIMULATING HORMONE: FSH: 9.1 m[IU]/mL

## 2018-11-21 LAB — TSH: TSH: 3.16 u[IU]/mL (ref 0.450–4.500)

## 2018-11-21 LAB — ESTRADIOL: Estradiol: 41.1 pg/mL

## 2018-11-21 LAB — ANTI MULLERIAN HORMONE: ANTI-MULLERIAN HORMONE (AMH): 2.64 ng/mL

## 2018-11-21 LAB — BETA HCG QUANT (REF LAB): hCG Quant: <1 m[IU]/mL

## 2018-11-21 LAB — PROLACTIN: Prolactin: 7.6 ng/mL (ref 4.8–23.3)

## 2018-11-30 ENCOUNTER — Ambulatory Visit: Payer: Medicaid Other | Admitting: Family Medicine

## 2018-12-08 DIAGNOSIS — J019 Acute sinusitis, unspecified: Secondary | ICD-10-CM | POA: Diagnosis not present

## 2018-12-14 ENCOUNTER — Other Ambulatory Visit: Payer: Self-pay

## 2018-12-14 ENCOUNTER — Encounter: Payer: Self-pay | Admitting: Obstetrics and Gynecology

## 2018-12-14 ENCOUNTER — Ambulatory Visit (INDEPENDENT_AMBULATORY_CARE_PROVIDER_SITE_OTHER): Payer: Medicaid Other | Admitting: Obstetrics and Gynecology

## 2018-12-14 VITALS — BP 144/94 | HR 71 | Wt 212.4 lb

## 2018-12-14 DIAGNOSIS — N912 Amenorrhea, unspecified: Secondary | ICD-10-CM | POA: Diagnosis not present

## 2018-12-14 MED ORDER — MEDROXYPROGESTERONE ACETATE 10 MG PO TABS: 10.0000 mg | ORAL_TABLET | Freq: Every day | ORAL | 3 refills | Status: DC

## 2018-12-14 NOTE — Progress Notes (Signed)
Obstetrics and Gynecology Visit Return Patient Evaluation  Appointment Date: 12/14/2018  Primary Care Provider: Augusto Gamble  OBGYN Clinic: [facility]  Chief Complaint: follow up amenorrhea  History of Present Illness:  Bridget Scott is a 38 y.o. with the above CC. Patient seen by me for new patient exam for amenorrhea. At that visit, her exam was normal and she had no menopausal s/s. She had lab work done and all was negative (beta hcg, PRL, TSH, AMH [2.64], estradiol, FSH).  Interval History: Since that time, she states that she hasn't had any bleeding or spotting.   Review of Systems: as noted in the History of Present Illness.   Patient Active Problem List   Diagnosis Date Noted  . Amenorrhea 11/16/2018  . Tobacco use disorder 05/27/2017  . Hypertension   . Diabetes mellitus without complication ([place])   . Asthma   . GERD (gastroesophageal reflux disease)   . Hypertensive urgency    Medications:  Shawnika Riskin. Cantor had no medications administered during this visit. Current Outpatient Medications  Medication Sig Dispense Refill  . albuterol (PROAIR HFA) 108 (90 Base) MCG/ACT inhaler Inhale 2 puffs into the lungs every 6 (six) hours as needed for wheezing or shortness of breath.    . lisinopril-hydrochlorothiazide (PRINZIDE,ZESTORETIC) 20-25 MG tablet Take 1 tablet by mouth daily. 30 tablet 0  . metFORMIN (GLUCOPHAGE) 500 MG tablet Take 500 mg by mouth 3 (three) times daily.    . nitroGLYCERIN (NITROSTAT) 0.4 MG SL tablet Place 0.4 mg under the tongue every 5 (five) minutes as needed for chest pain.    . pantoprazole (PROTONIX) 40 MG tablet Take 40 mg by mouth daily as needed (for reflux symptoms).     Marland Kitchen oxyCODONE-acetaminophen (PERCOCET) 5-325 MG tablet Take 1 tablet by mouth every 4 (four) hours as needed. (Patient not taking: Reported on 11/16/2018) 30 tablet 0   No current facility-administered medications for this visit.      Allergies: is allergic to analgesic balm [trolamine]; bengay pain relief [menthol]; guaifenesin; and latex.  Physical Exam:  BP (!) 144/94   Pulse 71   Wt 212 lb 6.4 oz (96.3 kg)   LMP 06/26/2018   BMI 35.35 kg/m  Body mass index is 35.35 kg/m. General appearance: Well nourished, well developed female in no acute distress.  Neuro/Psych:  Normal mood and affect.     Assessment: pt stable  Plan:  1. Amenorrhea I told her that no indication of ovarian insufficiency and likely anovulatory d/o causing amenorrhea. Given her age, I told her I recommend referral to REI and I recommend she do a provera withdrawal test in the interim and do cyclic provera to decrease hyperplasia risk until seen by REI. She is on an ace and I will CC her provider and ask that she be switched to something else since she would like to get pregnant in the near future. Pt to contact provider if she hasn't heard from them by Dec 1   RTC: PRN  Durene Romans MD Attending [facility])

## 2019-01-01 ENCOUNTER — Other Ambulatory Visit: Payer: Self-pay | Admitting: Obstetrics and Gynecology

## 2019-01-01 MED ORDER — AMLODIPINE BESYLATE 5 MG PO TABS: 5.0000 mg | ORAL_TABLET | Freq: Every day | ORAL | 6 refills | Status: DC

## 2019-01-03 ENCOUNTER — Encounter: Payer: Self-pay | Admitting: Radiology

## 2019-01-08 DIAGNOSIS — J019 Acute sinusitis, unspecified: Secondary | ICD-10-CM | POA: Diagnosis not present

## 2019-01-08 DIAGNOSIS — Z20828 Contact with and (suspected) exposure to other viral communicable diseases: Secondary | ICD-10-CM | POA: Diagnosis not present

## 2019-02-23 DIAGNOSIS — J3489 Other specified disorders of nose and nasal sinuses: Secondary | ICD-10-CM | POA: Diagnosis not present

## 2019-02-23 DIAGNOSIS — R590 Localized enlarged lymph nodes: Secondary | ICD-10-CM | POA: Diagnosis not present

## 2019-02-23 DIAGNOSIS — R05 Cough: Secondary | ICD-10-CM | POA: Diagnosis not present

## 2019-02-23 DIAGNOSIS — C8190 Hodgkin lymphoma, unspecified, unspecified site: Secondary | ICD-10-CM | POA: Diagnosis not present

## 2019-03-01 DIAGNOSIS — I10 Essential (primary) hypertension: Secondary | ICD-10-CM | POA: Diagnosis not present

## 2019-03-01 DIAGNOSIS — R05 Cough: Secondary | ICD-10-CM | POA: Diagnosis not present

## 2019-03-01 DIAGNOSIS — E119 Type 2 diabetes mellitus without complications: Secondary | ICD-10-CM | POA: Diagnosis not present

## 2019-03-01 DIAGNOSIS — R52 Pain, unspecified: Secondary | ICD-10-CM | POA: Diagnosis not present

## 2019-03-01 DIAGNOSIS — R590 Localized enlarged lymph nodes: Secondary | ICD-10-CM | POA: Diagnosis not present

## 2019-03-08 DIAGNOSIS — E118 Type 2 diabetes mellitus with unspecified complications: Secondary | ICD-10-CM | POA: Diagnosis not present

## 2019-03-08 DIAGNOSIS — Z794 Long term (current) use of insulin: Secondary | ICD-10-CM | POA: Diagnosis not present

## 2019-03-08 DIAGNOSIS — R590 Localized enlarged lymph nodes: Secondary | ICD-10-CM | POA: Diagnosis not present

## 2019-03-08 DIAGNOSIS — R109 Unspecified abdominal pain: Secondary | ICD-10-CM | POA: Diagnosis not present

## 2019-03-16 DIAGNOSIS — E118 Type 2 diabetes mellitus with unspecified complications: Secondary | ICD-10-CM | POA: Diagnosis not present

## 2019-03-16 DIAGNOSIS — B373 Candidiasis of vulva and vagina: Secondary | ICD-10-CM | POA: Diagnosis not present

## 2019-03-16 DIAGNOSIS — Z794 Long term (current) use of insulin: Secondary | ICD-10-CM | POA: Diagnosis not present

## 2019-03-16 DIAGNOSIS — R590 Localized enlarged lymph nodes: Secondary | ICD-10-CM | POA: Diagnosis not present

## 2019-03-16 DIAGNOSIS — I10 Essential (primary) hypertension: Secondary | ICD-10-CM | POA: Diagnosis not present

## 2019-03-21 DIAGNOSIS — F332 Major depressive disorder, recurrent severe without psychotic features: Secondary | ICD-10-CM | POA: Insufficient documentation

## 2019-03-21 DIAGNOSIS — G4700 Insomnia, unspecified: Secondary | ICD-10-CM | POA: Insufficient documentation

## 2019-03-21 DIAGNOSIS — F410 Panic disorder [episodic paroxysmal anxiety] without agoraphobia: Secondary | ICD-10-CM | POA: Insufficient documentation

## 2019-03-21 DIAGNOSIS — Z87828 Personal history of other (healed) physical injury and trauma: Secondary | ICD-10-CM | POA: Diagnosis not present

## 2019-03-21 HISTORY — DX: Insomnia, unspecified: G47.00

## 2019-04-10 ENCOUNTER — Encounter: Payer: Self-pay | Admitting: Radiology

## 2019-04-25 ENCOUNTER — Encounter: Payer: Self-pay | Admitting: Radiology

## 2019-06-04 DIAGNOSIS — S8002XA Contusion of left knee, initial encounter: Secondary | ICD-10-CM | POA: Diagnosis not present

## 2019-06-04 DIAGNOSIS — M25562 Pain in left knee: Secondary | ICD-10-CM | POA: Diagnosis not present

## 2019-06-15 DIAGNOSIS — S86919A Strain of unspecified muscle(s) and tendon(s) at lower leg level, unspecified leg, initial encounter: Secondary | ICD-10-CM | POA: Insufficient documentation

## 2019-06-15 DIAGNOSIS — S86912A Strain of unspecified muscle(s) and tendon(s) at lower leg level, left leg, initial encounter: Secondary | ICD-10-CM | POA: Diagnosis not present

## 2019-10-31 DIAGNOSIS — I10 Essential (primary) hypertension: Secondary | ICD-10-CM | POA: Diagnosis not present

## 2019-10-31 DIAGNOSIS — F332 Major depressive disorder, recurrent severe without psychotic features: Secondary | ICD-10-CM | POA: Diagnosis not present

## 2019-10-31 DIAGNOSIS — F410 Panic disorder [episodic paroxysmal anxiety] without agoraphobia: Secondary | ICD-10-CM | POA: Diagnosis not present

## 2019-10-31 DIAGNOSIS — J329 Chronic sinusitis, unspecified: Secondary | ICD-10-CM | POA: Diagnosis not present

## 2019-11-12 DIAGNOSIS — F410 Panic disorder [episodic paroxysmal anxiety] without agoraphobia: Secondary | ICD-10-CM | POA: Diagnosis not present

## 2019-11-12 DIAGNOSIS — I10 Essential (primary) hypertension: Secondary | ICD-10-CM | POA: Diagnosis not present

## 2019-11-12 DIAGNOSIS — F419 Anxiety disorder, unspecified: Secondary | ICD-10-CM | POA: Diagnosis not present

## 2019-11-12 DIAGNOSIS — F332 Major depressive disorder, recurrent severe without psychotic features: Secondary | ICD-10-CM | POA: Diagnosis not present

## 2019-11-12 DIAGNOSIS — G4700 Insomnia, unspecified: Secondary | ICD-10-CM | POA: Diagnosis not present

## 2019-11-12 DIAGNOSIS — K219 Gastro-esophageal reflux disease without esophagitis: Secondary | ICD-10-CM | POA: Diagnosis not present

## 2019-11-12 DIAGNOSIS — E118 Type 2 diabetes mellitus with unspecified complications: Secondary | ICD-10-CM | POA: Diagnosis not present

## 2019-11-12 DIAGNOSIS — Z794 Long term (current) use of insulin: Secondary | ICD-10-CM | POA: Diagnosis not present

## 2019-11-12 DIAGNOSIS — J329 Chronic sinusitis, unspecified: Secondary | ICD-10-CM | POA: Diagnosis not present

## 2019-12-14 DIAGNOSIS — S86912A Strain of unspecified muscle(s) and tendon(s) at lower leg level, left leg, initial encounter: Secondary | ICD-10-CM | POA: Diagnosis not present

## 2019-12-14 DIAGNOSIS — S86912D Strain of unspecified muscle(s) and tendon(s) at lower leg level, left leg, subsequent encounter: Secondary | ICD-10-CM | POA: Diagnosis not present

## 2019-12-24 DIAGNOSIS — F419 Anxiety disorder, unspecified: Secondary | ICD-10-CM | POA: Diagnosis not present

## 2019-12-24 DIAGNOSIS — F332 Major depressive disorder, recurrent severe without psychotic features: Secondary | ICD-10-CM | POA: Diagnosis not present

## 2019-12-24 DIAGNOSIS — F410 Panic disorder [episodic paroxysmal anxiety] without agoraphobia: Secondary | ICD-10-CM | POA: Diagnosis not present

## 2020-02-11 DIAGNOSIS — S8392XA Sprain of unspecified site of left knee, initial encounter: Secondary | ICD-10-CM | POA: Diagnosis not present

## 2020-02-11 DIAGNOSIS — S93402A Sprain of unspecified ligament of left ankle, initial encounter: Secondary | ICD-10-CM | POA: Diagnosis not present

## 2020-02-18 DIAGNOSIS — S93402A Sprain of unspecified ligament of left ankle, initial encounter: Secondary | ICD-10-CM | POA: Diagnosis not present

## 2020-02-18 DIAGNOSIS — S8392XA Sprain of unspecified site of left knee, initial encounter: Secondary | ICD-10-CM | POA: Diagnosis not present

## 2020-02-21 ENCOUNTER — Ambulatory Visit: Payer: Medicaid Other

## 2020-02-26 DIAGNOSIS — S8392XA Sprain of unspecified site of left knee, initial encounter: Secondary | ICD-10-CM | POA: Diagnosis not present

## 2020-02-27 DIAGNOSIS — S8002XS Contusion of left knee, sequela: Secondary | ICD-10-CM | POA: Diagnosis not present

## 2020-02-27 DIAGNOSIS — S83512A Sprain of anterior cruciate ligament of left knee, initial encounter: Secondary | ICD-10-CM | POA: Diagnosis not present

## 2020-02-27 DIAGNOSIS — S93402S Sprain of unspecified ligament of left ankle, sequela: Secondary | ICD-10-CM | POA: Diagnosis not present

## 2020-03-03 ENCOUNTER — Encounter: Payer: Self-pay | Admitting: Obstetrics and Gynecology

## 2020-03-03 ENCOUNTER — Ambulatory Visit: Payer: Medicaid Other | Admitting: Obstetrics and Gynecology

## 2020-03-04 NOTE — Progress Notes (Signed)
Patient did not keep her GYN appointment for 03/03/2020.  Durene Romans MD Attending [facility] for [facility] [employer] [facility])

## 2020-04-13 ENCOUNTER — Other Ambulatory Visit: Payer: Self-pay

## 2020-04-13 ENCOUNTER — Ambulatory Visit
Admission: RE | Admit: 2020-04-13 | Discharge: 2020-04-13 | Disposition: A | Discharge status: Home / Self Care | Payer: Medicaid Other | Source: Ambulatory Visit | Attending: Emergency Medicine | Admitting: Emergency Medicine

## 2020-04-13 VITALS — BP 158/99 | HR 91 | Temp 97.7°F | Resp 18

## 2020-04-13 DIAGNOSIS — N390 Urinary tract infection, site not specified: Secondary | ICD-10-CM | POA: Diagnosis not present

## 2020-04-13 DIAGNOSIS — R319 Hematuria, unspecified: Secondary | ICD-10-CM | POA: Insufficient documentation

## 2020-04-13 LAB — POCT URINALYSIS DIP (MANUAL ENTRY)
Bilirubin, UA: NEGATIVE
Glucose, UA: 500 mg/dL — AB
Ketones, POC UA: NEGATIVE mg/dL
Leukocytes, UA: NEGATIVE
Nitrite, UA: POSITIVE — AB
Protein Ur, POC: >=300 mg/dL — AB
Spec Grav, UA: 1.025 (ref 1.010–1.025)
Urobilinogen, UA: 0.2 E.U./dL
pH, UA: 6 (ref 5.0–8.0)

## 2020-04-13 LAB — POCT URINE PREGNANCY: Preg Test, Ur: NEGATIVE

## 2020-04-13 MED ORDER — BLOOD GLUCOSE MONITOR KIT: PACK | 0 refills | Status: AC

## 2020-04-13 MED ORDER — CEPHALEXIN 500 MG PO CAPS: 500.0000 mg | ORAL_CAPSULE | Freq: Two times a day (BID) | ORAL | 0 refills | Status: AC

## 2020-04-13 NOTE — Discharge Instructions (Addendum)
Urine showed evidence of infection. We are treating you with keflex- twice daily for 1 week. Be sure to take full course. Stay hydrated- urine should be pale yellow to clear.   Please return or follow up with your primary provider if symptoms not improving with treatment. Please return sooner if you have worsening of symptoms or develop fever, nausea, vomiting, abdominal pain, back pain, lightheadedness, dizziness.

## 2020-04-13 NOTE — ED Provider Notes (Signed)
[facility]    CSN: [id number] Arrival date & time: 04/13/20  0932      History   Chief Complaint Chief Complaint  Patient presents with  . Flank Pain  . Dysuria    HPI Bridget Scott is a 40 y.o. female history of asthma, GERD, DM type II presenting today for evaluation of flank pain and dysuria.  Reports urinary frequency urgency dysuria and lower abdominal discomfort for approximately 1 week.  Denies fevers nausea or vomiting.  Denies history of UTIs or kidney stones.  Does report history of diabetes, reports taking Metformin 1000 mg 3 times daily and will occasionally have poor compliance due to episodes of relative hypoglycemia.  Does not check sugars at home.  HPI  Past Medical History:  Diagnosis Date  . Asthma   . Cancer ([place])    lymphoma  . Dental caries    periodontitis, maxillary cyst  . Depression   . Diabetes mellitus without complication ([place])   . GERD (gastroesophageal reflux disease)   . Headache    migraines  . Hodgkin lymphoma ([employer])    in remission as of 2020. [facility] by [facility]  . Hypertension   . Hypertensive urgency   . Pneumonia    as a baby  . Seasonal allergies     Patient Active Problem List   Diagnosis Date Noted  . Amenorrhea 11/16/2018  . Tobacco use disorder 05/27/2017  . Hypertension   . Diabetes mellitus without complication ([place])   . Asthma   . GERD (gastroesophageal reflux disease)   . Hypertensive urgency     Past Surgical History:  Procedure Laterality Date  . CHOLECYSTECTOMY    . LYMPH NODE BIOPSY    . PORT-A-CATH REMOVAL    . PORTACATH PLACEMENT    . TOOTH EXTRACTION Bilateral 05/27/2017   Procedure: MULTIPLE EXTRACTIONS;  Surgeon: Diona Browner, DDS;  Location: [facility];  Service: Oral Surgery;  Laterality: Bilateral;    OB History    Gravida  2   Para  1   Term      Preterm  1   AB  1   Living  0     SAB  1   IAB      Ectopic      Multiple      Live Births  0        Obstetric  Comments  G1: PPROM, PTL         Home Medications    Prior to Admission medications   Medication Sig Start Date End Date Taking? Authorizing Provider  blood glucose meter kit and supplies KIT Dispense based on patient and insurance preference. Use up to four times daily as directed. (FOR ICD-9 250.00, 250.01). 04/13/20  Yes [name], [name], [name]  cephALEXin (KEFLEX) 500 MG capsule Take 1 capsule (500 mg total) by mouth 2 (two) times daily for 7 days. 04/13/20 04/20/20 Yes [name], [name], [name]  lisinopril-hydrochlorothiazide (PRINZIDE,ZESTORETIC) 20-25 MG tablet Take 1 tablet by mouth daily. 03/15/17  Yes [name], [name], MD  LORAZEPAM PO Take by mouth.   Yes [provider]  medroxyPROGESTERone (PROVERA) 10 MG tablet Take 1 tablet (10 mg total) by mouth daily for 14 days. Use every month. 12/14/18 12/28/18 Yes [name], MD  metFORMIN (GLUCOPHAGE) 500 MG tablet Take 500 mg by mouth 3 (three) times daily.   Yes [provider]  pantoprazole (PROTONIX) 40 MG tablet Take 40 mg by mouth daily as needed (for  reflux symptoms).  06/06/15  Yes [provider]  albuterol (PROAIR HFA) 108 (90 Base) MCG/ACT inhaler Inhale 2 puffs into the lungs every 6 (six) hours as needed for wheezing or shortness of breath.    [provider]  nitroGLYCERIN (NITROSTAT) 0.4 MG SL tablet Place 0.4 mg under the tongue every 5 (five) minutes as needed for chest pain.    [provider]  amLODipine (NORVASC) 5 MG tablet Take 1 tablet (5 mg total) by mouth daily. 01/01/19 04/13/20  [name], MD    Family History Family History  Problem Relation Age of Onset  . Lung cancer Mother   . Heart attack Father     Social History Social History   Tobacco Use  . Smoking status: Current Every Day Smoker    Packs/day: 2.00    Types: Cigarettes  . Smokeless tobacco: Never Used  Vaping Use  . Vaping Use: Never used  Substance Use Topics  . Alcohol use: No   . Drug use: No     Allergies   Analgesic balm [trolamine], Bengay pain relief [menthol], Guaifenesin, and Latex   Review of Systems Review of Systems  Constitutional: Negative for fever.  Respiratory: Negative for shortness of breath.   Cardiovascular: Negative for chest pain.  Gastrointestinal: Negative for abdominal pain, diarrhea, nausea and vomiting.  Genitourinary: Positive for dysuria, flank pain and frequency. Negative for genital sores, hematuria, menstrual problem, vaginal bleeding, vaginal discharge and vaginal pain.  Musculoskeletal: Negative for back pain.  Skin: Negative for rash.  Neurological: Negative for dizziness, light-headedness and headaches.     Physical Exam Triage Vital Signs ED Triage Vitals  Enc Vitals Group     BP      Pulse      Resp      Temp      Temp src      SpO2      Weight      Height      Head Circumference      Peak Flow      Pain Score      Pain Loc      Pain Edu?      Excl. in [place]?    No data found.  Updated Vital Signs BP (!) 158/99   Pulse 91   Temp 97.7 F (36.5 C) (Oral)   Resp 18   LMP 03/20/2020 (Approximate)   SpO2 95%   Visual Acuity Right Eye Distance:   Left Eye Distance:   Bilateral Distance:    Right Eye Near:   Left Eye Near:    Bilateral Near:     Physical Exam Vitals and nursing note reviewed.  Constitutional:      Appearance: She is well-developed.     Comments: No acute distress  HENT:     Head: Normocephalic and atraumatic.     Nose: Nose normal.  Eyes:     Conjunctiva/sclera: Conjunctivae normal.  Cardiovascular:     Rate and Rhythm: Normal rate.  Pulmonary:     Effort: Pulmonary effort is normal. No respiratory distress.  Abdominal:     General: There is no distension.  Musculoskeletal:        General: Normal range of motion.     Cervical back: Neck supple.  Skin:    General: Skin is warm and dry.  Neurological:     Mental Status: She is alert and oriented to person, place, and  time.      UC Treatments /  Results  Labs (all labs ordered are listed, but only abnormal results are displayed) Labs Reviewed  POCT URINALYSIS DIP (MANUAL ENTRY) - Abnormal; Notable for the following components:      Result Value   Clarity, UA cloudy (*)    Glucose, UA =500 (*)    Blood, UA large (*)    Protein Ur, POC >=300 (*)    Nitrite, UA Positive (*)    All other components within normal limits  URINE CULTURE  POCT URINE PREGNANCY    EKG   Radiology No results found.  Procedures Procedures (including critical care time)  Medications Ordered in UC Medications - No data to display  Initial Impression / Assessment and Plan / UC Course  I have reviewed the triage vital signs and the nursing notes.  Pertinent labs & imaging results that were available during my care of the patient were reviewed by me and considered in my medical decision making (see chart for details).     UA consistent with UTI, positive nitrites, culture pending, placing on Keflex twice daily x1 week, will alter therapy based off sensitivities if needed.  Push fluids.  Discussed glucose in urine and monitoring sugars at home, encouraged to do Metformin at least twice daily and follow-up with primary care.  Provided prescription for glucometer at home to monitor sugars.  Discussed strict return precautions. Patient verbalized understanding and is agreeable with plan.  Final Clinical Impressions(s) / UC Diagnoses   Final diagnoses:  Urinary tract infection with hematuria, site unspecified     Discharge Instructions     Urine showed evidence of infection. We are treating you with keflex- twice daily for 1 week. Be sure to take full course. Stay hydrated- urine should be pale yellow to clear.   Please return or follow up with your primary provider if symptoms not improving with treatment. Please return sooner if you have worsening of symptoms or develop fever, nausea, vomiting, abdominal pain,  back pain, lightheadedness, dizziness.    ED Prescriptions    Medication Sig Dispense Auth. Provider   blood glucose meter kit and supplies KIT Dispense based on patient and insurance preference. Use up to four times daily as directed. (FOR ICD-9 250.00, 250.01). 1 each [name], [name], [name]   cephALEXin (KEFLEX) 500 MG capsule Take 1 capsule (500 mg total) by mouth 2 (two) times daily for 7 days. 14 capsule [name], Sellers C, [name]     PDMP not reviewed this encounter.   Janith Lima, [name] 04/13/20 1041

## 2020-04-13 NOTE — ED Triage Notes (Signed)
C/O dysuria, polyuria, and urinary urgency x approx 1 wk without fevers.  C/O bilat flank pain now.

## 2020-04-15 LAB — URINE CULTURE: Culture: >=100000 — AB

## 2020-05-22 ENCOUNTER — Other Ambulatory Visit: Payer: Self-pay

## 2020-05-22 ENCOUNTER — Ambulatory Visit
Admission: RE | Admit: 2020-05-22 | Discharge: 2020-05-22 | Disposition: A | Discharge status: Home / Self Care | Payer: Medicaid Other | Source: Ambulatory Visit | Attending: Emergency Medicine | Admitting: Emergency Medicine

## 2020-05-22 VITALS — BP 170/95 | HR 80 | Temp 98.6°F | Resp 18

## 2020-05-22 DIAGNOSIS — R059 Cough, unspecified: Secondary | ICD-10-CM

## 2020-05-22 DIAGNOSIS — I10 Essential (primary) hypertension: Secondary | ICD-10-CM | POA: Diagnosis not present

## 2020-05-22 DIAGNOSIS — J302 Other seasonal allergic rhinitis: Secondary | ICD-10-CM

## 2020-05-22 MED ORDER — BENZONATATE 100 MG PO CAPS: 100.0000 mg | ORAL_CAPSULE | Freq: Three times a day (TID) | ORAL | 0 refills | Status: DC | PRN

## 2020-05-22 NOTE — ED Triage Notes (Signed)
Pt presents with complaints of dry cough, congestion, and feeling like it is hard to breath during the coughing fits x 4 days. Pt states she gets like this this time of year most of the time but not this bad usually. Denies any other symptoms.

## 2020-05-22 NOTE — Discharge Instructions (Addendum)
Take the [facility] as needed for cough.  Additionally take over-the-counter Allegra and use over-the-counter Flonase nasal spray.  Follow up with your primary care provider if your symptoms are not improving.    Your blood pressure is elevated today at 170/95.  Please have this rechecked by your primary care provider in 1-2 weeks.

## 2020-05-22 NOTE — ED Provider Notes (Signed)
Bridget Scott    CSN: [id number] Arrival date & time: 05/22/20  1318      History   Chief Complaint Chief Complaint  Patient presents with  . Cough    HPI Bridget Scott is a 40 y.o. female.   Patient presents with nasal congestion, postnasal drip, runny nose, nonproductive cough.  She denies fever, chills, sore throat, wheezing, shortness of breath, vomiting, diarrhea, or other symptoms.  Treatment attempted at home with DayQuil and NyQuil.  Her medical history includes diabetes, hypertension, hypertensive urgency, seasonal allergies, asthma, current everyday smoker, GERD, Hodgkin's lymphoma.  The history is provided by the patient and medical records.    Past Medical History:  Diagnosis Date  . Asthma   . Cancer ([place])    lymphoma  . Dental caries    periodontitis, maxillary cyst  . Depression   . Diabetes mellitus without complication ([place])   . GERD (gastroesophageal reflux disease)   . Headache    migraines  . Hodgkin lymphoma ([place])    in remission as of 2020. followed by [facility]  . Hypertension   . Hypertensive urgency   . Pneumonia    as a baby  . Seasonal allergies     Patient Active Problem List   Diagnosis Date Noted  . Amenorrhea 11/16/2018  . Tobacco use disorder 05/27/2017  . Hypertension   . Diabetes mellitus without complication ([place])   . Asthma   . GERD (gastroesophageal reflux disease)   . Hypertensive urgency     Past Surgical History:  Procedure Laterality Date  . CHOLECYSTECTOMY    . LYMPH NODE BIOPSY    . PORT-A-CATH REMOVAL    . PORTACATH PLACEMENT    . TOOTH EXTRACTION Bilateral 05/27/2017   Procedure: MULTIPLE EXTRACTIONS;  Surgeon: Diona Browner, DDS;  Location: [facility];  Service: Oral Surgery;  Laterality: Bilateral;    OB History    Gravida  2   Para  1   Term      Preterm  1   AB  1   Living  0     SAB  1   IAB      Ectopic      Multiple      Live Births  0        Obstetric Comments  G1: PPROM, PTL          Home Medications    Prior to Admission medications   Medication Sig Start Date End Date Taking? Authorizing Provider  benzonatate (TESSALON) 100 MG capsule Take 1 capsule (100 mg total) by mouth 3 (three) times daily as needed for cough. 05/22/20  Yes Sharion Balloon, NP  albuterol (PROAIR HFA) 108 (90 Base) MCG/ACT inhaler Inhale 2 puffs into the lungs every 6 (six) hours as needed for wheezing or shortness of breath.    [provider]  blood glucose meter kit and supplies KIT Dispense based on patient and insurance preference. Use up to four times daily as directed. (FOR ICD-9 250.00, 250.01). 04/13/20   [name], [name], [name]  lisinopril-hydrochlorothiazide (PRINZIDE,ZESTORETIC) 20-25 MG tablet Take 1 tablet by mouth daily. 03/15/17   [name], [name], MD  LORAZEPAM PO Take by mouth.    [provider]  medroxyPROGESTERone (PROVERA) 10 MG tablet Take 1 tablet (10 mg total) by mouth daily for 14 days. Use every month. 12/14/18 12/28/18  [name], MD  metFORMIN (GLUCOPHAGE) 500 MG tablet Take 500 mg by mouth 3 (three) times daily.  [provider]  nitroGLYCERIN (NITROSTAT) 0.4 MG SL tablet Place 0.4 mg under the tongue every 5 (five) minutes as needed for chest pain.    [provider]  pantoprazole (PROTONIX) 40 MG tablet Take 40 mg by mouth daily as needed (for reflux symptoms).  06/06/15   [provider]  amLODipine (NORVASC) 5 MG tablet Take 1 tablet (5 mg total) by mouth daily. 01/01/19 04/13/20  [name], MD    Family History Family History  Problem Relation Age of Onset  . Lung cancer Mother   . Heart attack Father     Social History Social History   Tobacco Use  . Smoking status: Current Every Day Smoker    Packs/day: 2.00    Types: Cigarettes  . Smokeless tobacco: Never Used  Vaping Use  . Vaping Use: Never used  Substance Use Topics  . Alcohol use: No  . Drug use: No     Allergies    Analgesic balm [trolamine], Bengay pain relief [menthol], Guaifenesin, and Latex   Review of Systems Review of Systems  Constitutional: Negative for chills and fever.  HENT: Positive for congestion, postnasal drip and rhinorrhea. Negative for ear pain and sore throat.   Eyes: Negative for pain and visual disturbance.  Respiratory: Positive for cough. Negative for shortness of breath and wheezing.   Cardiovascular: Negative for chest pain and palpitations.  Gastrointestinal: Negative for abdominal pain and vomiting.  Genitourinary: Negative for dysuria and hematuria.  Musculoskeletal: Negative for arthralgias and back pain.  Skin: Negative for color change and rash.  Neurological: Negative for seizures and syncope.  All other systems reviewed and are negative.    Physical Exam Triage Vital Signs ED Triage Vitals  Enc Vitals Group     BP      Pulse      Resp      Temp      Temp src      SpO2      Weight      Height      Head Circumference      Peak Flow      Pain Score      Pain Loc      Pain Edu?      Excl. in [place]?    No data found.  Updated Vital Signs BP (!) 170/95   Pulse 80   Temp 98.6 F (37 C)   Resp 18   LMP 04/30/2020   SpO2 97%   Visual Acuity Right Eye Distance:   Left Eye Distance:   Bilateral Distance:    Right Eye Near:   Left Eye Near:    Bilateral Near:     Physical Exam Vitals and nursing note reviewed.  Constitutional:      General: She is not in acute distress.    Appearance: She is well-developed. She is not ill-appearing.  HENT:     Head: Normocephalic and atraumatic.     Right Ear: Tympanic membrane normal.     Left Ear: Tympanic membrane normal.     Nose: Rhinorrhea present.     Mouth/Throat:     Mouth: Mucous membranes are moist.     Pharynx: Oropharynx is clear.  Eyes:     Conjunctiva/sclera: Conjunctivae normal.  Cardiovascular:     Rate and Rhythm: Normal rate and regular rhythm.     Heart sounds: Normal heart  sounds.  Pulmonary:     Effort: Pulmonary effort is normal. No respiratory distress.  Breath sounds: Normal breath sounds. No wheezing.     Comments: Nonproductive cough during exam.  No wheezes.  O2 sat 97%. Abdominal:     Palpations: Abdomen is soft.     Tenderness: There is no abdominal tenderness.  Musculoskeletal:     Cervical back: Neck supple.  Skin:    General: Skin is warm and dry.  Neurological:     General: No focal deficit present.     Mental Status: She is alert and oriented to person, place, and time.     Gait: Gait normal.  Psychiatric:        Mood and Affect: Mood normal.        Behavior: Behavior normal.      UC Treatments / Results  Labs (all labs ordered are listed, but only abnormal results are displayed) Labs Reviewed - No data to display  EKG   Radiology No results found.  Procedures Procedures (including critical care time)  Medications Ordered in UC Medications - No data to display  Initial Impression / Assessment and Plan / UC Course  I have reviewed the triage vital signs and the nursing notes.  Pertinent labs & imaging results that were available during my care of the patient were reviewed by me and considered in my medical decision making (see chart for details).   Cough, seasonal allergies.  Elevated blood pressure reading with known hypertension.  Patient declines COVID test.  Treating cough with Tessalon Perles.  Cautioned patient to avoid over-the-counter medications that can cause elevated blood pressure.  Instructed her to take OTC Allegra and Flonase for her symptoms.  Instructed her to follow-up with her PCP if her symptoms are not improving.  Also discussed that her blood pressure is elevated today needs to be rechecked by her PCP in 1 to 2 weeks.  Education provided on managing hypertension.  Patient agrees to plan of care.   Final Clinical Impressions(s) / UC Diagnoses   Final diagnoses:  Cough  Seasonal allergies   Elevated blood pressure reading in office with diagnosis of hypertension     Discharge Instructions     Take the Tessalon Perles as needed for cough.  Additionally take over-the-counter Allegra and use over-the-counter Flonase nasal spray.  Follow up with your primary care provider if your symptoms are not improving.    Your blood pressure is elevated today at 170/95.  Please have this rechecked by your primary care provider in 1-2 weeks.           ED Prescriptions    Medication Sig Dispense Auth. Provider   benzonatate (TESSALON) 100 MG capsule Take 1 capsule (100 mg total) by mouth 3 (three) times daily as needed for cough. 21 capsule Sharion Balloon, NP     I have reviewed the PDMP during this encounter.   Sharion Balloon, NP 05/22/20 1351

## 2020-06-03 ENCOUNTER — Ambulatory Visit
Admission: RE | Admit: 2020-06-03 | Discharge: 2020-06-03 | Disposition: A | Discharge status: Home / Self Care | Payer: Medicaid Other | Source: Ambulatory Visit | Attending: Emergency Medicine | Admitting: Emergency Medicine

## 2020-06-03 ENCOUNTER — Other Ambulatory Visit: Payer: Self-pay

## 2020-06-03 VITALS — BP 171/110 | HR 66 | Temp 98.0°F | Resp 18

## 2020-06-03 DIAGNOSIS — J22 Unspecified acute lower respiratory infection: Secondary | ICD-10-CM

## 2020-06-03 MED ORDER — PREDNISONE 20 MG PO TABS: 40.0000 mg | ORAL_TABLET | Freq: Every day | ORAL | 0 refills | Status: AC

## 2020-06-03 MED ORDER — DOXYCYCLINE HYCLATE 100 MG PO CAPS: 100.0000 mg | ORAL_CAPSULE | Freq: Two times a day (BID) | ORAL | 0 refills | Status: AC

## 2020-06-03 MED ORDER — CETIRIZINE HCL 10 MG PO CAPS: 10.0000 mg | ORAL_CAPSULE | Freq: Every day | ORAL | 0 refills | Status: AC

## 2020-06-03 MED ORDER — BENZONATATE 200 MG PO CAPS: 200.0000 mg | ORAL_CAPSULE | Freq: Three times a day (TID) | ORAL | 0 refills | Status: AC | PRN

## 2020-06-03 MED ORDER — ALBUTEROL SULFATE HFA 108 (90 BASE) MCG/ACT IN AERS: 1.0000 | INHALATION_SPRAY | Freq: Four times a day (QID) | RESPIRATORY_TRACT | 0 refills | Status: DC | PRN

## 2020-06-03 NOTE — Discharge Instructions (Signed)
Begin doxycycline twice daily for the next 10 days Albuterol inhaler 1 to 2 puffs every 4-6 hours as needed for shortness of breath chest tightness and wheezing Prednisone on daily x5 days-please avoid extra sugars, drink plenty of fluids-monitor sugars while on this medicine Daily cetirizine for congestion and drainage Tessalon every 8 hours for cough Please follow-up if not improving or worsening

## 2020-06-03 NOTE — ED Triage Notes (Signed)
Pt c/o cough, nasal/chest congestion, sore throat, and loss of voice for over 2wks. States was seen and tx's at [facility] 2wks ago and feels like the cough is getting worse.

## 2020-06-03 NOTE — ED Provider Notes (Signed)
[facility]    CSN: [id number] Arrival date & time: 06/03/20  1353      History   Chief Complaint Chief Complaint  Patient presents with  . Cough    HPI Bridget Scott is a 39 y.o. female presenting today for evaluation of congestion and sore throat.  Reports cough and congestion over the past 2 weeks.  Reports increased chest tightness and shortness of breath.  Cough is relatively dry, but occasionally productive.  Denies known fevers.  HPI  Past Medical History:  Diagnosis Date  . Asthma   . Cancer (HCC)    lymphoma  . Dental caries    periodontitis, maxillary cyst  . Depression   . Diabetes mellitus without complication (HCC)   . GERD (gastroesophageal reflux disease)   . Headache    migraines  . Hodgkin lymphoma (HCC)    in remission as of 2020. followed by [facility]  . Hypertension   . Hypertensive urgency   . Pneumonia    as a baby  . Seasonal allergies     Patient Active Problem List   Diagnosis Date Noted  . Amenorrhea 11/16/2018  . Tobacco use disorder 05/27/2017  . Hypertension   . Diabetes mellitus without complication (HCC)   . Asthma   . GERD (gastroesophageal reflux disease)   . Hypertensive urgency     Past Surgical History:  Procedure Laterality Date  . CHOLECYSTECTOMY    . LYMPH NODE BIOPSY    . PORT-A-CATH REMOVAL    . PORTACATH PLACEMENT    . TOOTH EXTRACTION Bilateral 05/27/2017   Procedure: MULTIPLE EXTRACTIONS;  Surgeon: Jensen, Scott, DDS;  Location: [facility] OR;  Service: Oral Surgery;  Laterality: Bilateral;    OB History    Gravida  2   Para  1   Term      Preterm  1   AB  1   Living  0     SAB  1   IAB      Ectopic      Multiple      Live Births  0        Obstetric Comments  G1: PPROM, PTL         Home Medications    Prior to Admission medications   Medication Sig Start Date End Date Taking? Authorizing Provider  albuterol (VENTOLIN HFA) 108 (90 Base) MCG/ACT inhaler Inhale 1-2 puffs into  the lungs every 6 (six) hours as needed for wheezing or shortness of breath. 06/03/20  Yes [name], [name], [name]  benzonatate (TESSALON) 200 MG capsule Take 1 capsule (200 mg total) by mouth 3 (three) times daily as needed for up to 7 days for cough. 06/03/20 06/10/20 Yes [name], [name], [name]  Cetirizine HCl 10 MG CAPS Take 1 capsule (10 mg total) by mouth daily for 10 days. 06/03/20 06/13/20 Yes [name], [name], [name]  doxycycline (VIBRAMYCIN) 100 MG capsule Take 1 capsule (100 mg total) by mouth 2 (two) times daily for 10 days. 06/03/20 06/13/20 Yes [name], [name], [name]  predniSONE (DELTASONE) 20 MG tablet Take 2 tablets (40 mg total) by mouth daily with breakfast for 5 days. 06/03/20 06/08/20 Yes [name], [name], [name]  blood glucose meter kit and supplies KIT Dispense based on patient and insurance preference. Use up to four times daily as directed. (FOR ICD-9 250.00, 250.01). 04/13/20   [name], [name], [name]  lisinopril-hydrochlorothiazide (PRINZIDE,ZESTORETIC) 20-25 MG tablet Take 1 tablet by mouth daily. 03/15/17     [name], Christopher J, MD  LORAZEPAM PO Take by mouth.    [provider]  medroxyPROGESTERone (PROVERA) 10 MG tablet Take 1 tablet (10 mg total) by mouth daily for 14 days. Use every month. 12/14/18 12/28/18  Pickens, Charlie, MD  metFORMIN (GLUCOPHAGE) 500 MG tablet Take 500 mg by mouth 3 (three) times daily.    [provider]  nitroGLYCERIN (NITROSTAT) 0.4 MG SL tablet Place 0.4 mg under the tongue every 5 (five) minutes as needed for chest pain.    [provider]  pantoprazole (PROTONIX) 40 MG tablet Take 40 mg by mouth daily as needed (for reflux symptoms).  06/06/15   [provider]  amLODipine (NORVASC) 5 MG tablet Take 1 tablet (5 mg total) by mouth daily. 01/01/19 04/13/20  Pickens, Charlie, MD    Family History Family History  Problem Relation Age of Onset  . Lung cancer Mother   . Heart attack Father     Social History Social  History   Tobacco Use  . Smoking status: Current Every Day Smoker    Packs/day: 2.00    Types: Cigarettes  . Smokeless tobacco: Never Used  Vaping Use  . Vaping Use: Never used  Substance Use Topics  . Alcohol use: No  . Drug use: No     Allergies   Analgesic balm [trolamine], Bengay pain relief [menthol], Guaifenesin, and Latex   Review of Systems Review of Systems  Constitutional: Negative for activity change, appetite change, chills, fatigue and fever.  HENT: Positive for congestion and sore throat. Negative for ear pain, rhinorrhea, sinus pressure and trouble swallowing.   Eyes: Negative for discharge and redness.  Respiratory: Positive for cough. Negative for chest tightness and shortness of breath.   Cardiovascular: Negative for chest pain.  Gastrointestinal: Negative for abdominal pain, diarrhea, nausea and vomiting.  Musculoskeletal: Negative for myalgias.  Skin: Negative for rash.  Neurological: Negative for dizziness, light-headedness and headaches.     Physical Exam Triage Vital Signs ED Triage Vitals  Enc Vitals Group     BP      Pulse      Resp      Temp      Temp src      SpO2      Weight      Height      Head Circumference      Peak Flow      Pain Score      Pain Loc      Pain Edu?      Excl. in [place]?    No data found.  Updated Vital Signs BP (!) 171/110 (BP Location: Left Arm)   Pulse 66   Temp 98 F (36.7 C) (Oral)   Resp 18   LMP 06/02/2020   SpO2 97%   Visual Acuity Right Eye Distance:   Left Eye Distance:   Bilateral Distance:    Right Eye Near:   Left Eye Near:    Bilateral Near:     Physical Exam Vitals and nursing note reviewed.  Constitutional:      Appearance: She is well-developed.     Comments: No acute distress  HENT:     Head: Normocephalic and atraumatic.     Ears:     Comments: Bilateral ears without tenderness to palpation of external auricle, tragus and mastoid, EAC's without erythema or swelling, TM's with  good bony landmarks and cone of light. Non erythematous.     Nose: Nose normal.       Mouth/Throat:     Comments: Oral mucosa pink and moist, no tonsillar enlargement or exudate. Posterior pharynx patent and nonerythematous, no uvula deviation or swelling. Normal phonation. Eyes:     Conjunctiva/sclera: Conjunctivae normal.  Cardiovascular:     Rate and Rhythm: Normal rate.  Pulmonary:     Effort: Pulmonary effort is normal. No respiratory distress.     Comments: Breathing comfortably at rest, end expiratory wheezing in bilateral lung fields Abdominal:     General: There is no distension.  Musculoskeletal:        General: Normal range of motion.     Cervical back: Neck supple.  Skin:    General: Skin is warm and dry.  Neurological:     Mental Status: She is alert and oriented to person, place, and time.      UC Treatments / Results  Labs (all labs ordered are listed, but only abnormal results are displayed) Labs Reviewed - No data to display  EKG   Radiology No results found.  Procedures Procedures (including critical care time)  Medications Ordered in UC Medications - No data to display  Initial Impression / Assessment and Plan / UC Course  I have reviewed the triage vital signs and the nursing notes.  Pertinent labs & imaging results that were available during my care of the patient were reviewed by me and considered in my medical decision making (see chart for details).     Treating for bronchitis-prednisone and albuterol inhaler, discussed elevation of blood sugars in setting of diabetes, encourage patient to close monitor her sugars, drink plenty of fluids, limit extra sugars in diet and adhere to diabetes medicines.  Placing on doxycycline given symptoms x2 weeks.  Rest and fluids.  Discussed strict return precautions. Patient verbalized understanding and is agreeable with plan.  Final Clinical Impressions(s) / UC Diagnoses   Final diagnoses:  Lower  respiratory infection (e.g., bronchitis, pneumonia, pneumonitis, pulmonitis)     Discharge Instructions     Begin doxycycline twice daily for the next 10 days Albuterol inhaler 1 to 2 puffs every 4-6 hours as needed for shortness of breath chest tightness and wheezing Prednisone on daily x5 days-please avoid extra sugars, drink plenty of fluids-monitor sugars while on this medicine Daily cetirizine for congestion and drainage Tessalon every 8 hours for cough Please follow-up if not improving or worsening    ED Prescriptions    Medication Sig Dispense Auth. Provider   predniSONE (DELTASONE) 20 MG tablet Take 2 tablets (40 mg total) by mouth daily with breakfast for 5 days. 10 tablet Jackston O[place]aca C, [name]   albuterol (VENTOLIN HFA) 108 (90 Base) MCG/ACT inhaler Inhale 1-2 puffs into the lungs every 6 (six) hours as needed for wheezing or shortness of breath. 8 g [place] C, [name]   doxycycline (VIBRAMYCIN) 100 MG capsule Take 1 capsule (100 mg total) by mouth 2 (two) times daily for 10 days. 20 capsule Chancy Smigiel C, [name]   Cetirizine HCl 10 MG CAPS Take 1 capsule (10 mg total) by mouth daily for 10 days. 10 capsule Jomari Bartnik C, [name]   benzonatate (TESSALON) 200 MG capsule Take 1 capsule (200 mg total) by mouth 3 (three) times daily as needed for up to 7 days for cough. 28 capsule Aslan Himes, Seeley C, [name]     PDMP not reviewed this encounter.   Janith Lima, [place] 06/03/20 1514

## 2020-08-22 DIAGNOSIS — J301 Allergic rhinitis due to pollen: Secondary | ICD-10-CM | POA: Diagnosis not present

## 2020-12-25 ENCOUNTER — Ambulatory Visit
Admission: EM | Admit: 2020-12-25 | Discharge: 2020-12-25 | Disposition: A | Discharge status: Home / Self Care | Payer: Medicaid Other | Attending: Family Medicine | Admitting: Family Medicine

## 2020-12-25 ENCOUNTER — Encounter: Payer: Self-pay | Admitting: Emergency Medicine

## 2020-12-25 DIAGNOSIS — R519 Headache, unspecified: Secondary | ICD-10-CM

## 2020-12-25 DIAGNOSIS — R202 Paresthesia of skin: Secondary | ICD-10-CM

## 2020-12-25 MED ORDER — TIZANIDINE HCL 4 MG PO TABS: 4.0000 mg | ORAL_TABLET | Freq: Four times a day (QID) | ORAL | 0 refills | Status: DC | PRN

## 2020-12-25 NOTE — ED Provider Notes (Signed)
Bridget Scott    CSN: [id number] Arrival date & time: 12/25/20  1308      History   Chief Complaint Chief Complaint  Patient presents with   Headache    HPI Bridget Scott is a 40 y.o. female.   HPI Patient presents today with recurrent headache ongoing over a month. Patient reports a history of headaches following chemotherapy however has been managing with ibuprofen and Tylenol.  She reports she has been taking over-the-counter medications very frequently without any relief of headache symptoms.  She endorses pain is starting in her neck and radiating to the front of her face.  She has associated sinus congestion however that is only been present for 1 week.  Endorses some pressure behind her eyes.  Also reports experiencing intermittent tingling without pain involving the right arm radiating in right chest. No generalized weakness.  Past Medical History:  Diagnosis Date   Asthma    Cancer ([place])    lymphoma   Dental caries    periodontitis, maxillary cyst   Depression    Diabetes mellitus without complication (HCC)    GERD (gastroesophageal reflux disease)    Headache    migraines   Hodgkin lymphoma (HCC)    in remission as of 2020. followed by [facility]   Hypertension    Hypertensive urgency    Pneumonia    as a baby   Seasonal allergies     Patient Active Problem List   Diagnosis Date Noted   Amenorrhea 11/16/2018   Tobacco use disorder 05/27/2017   Hypertension    Diabetes mellitus without complication (HCC)    Asthma    GERD (gastroesophageal reflux disease)    Hypertensive urgency     Past Surgical History:  Procedure Laterality Date   CHOLECYSTECTOMY     LYMPH NODE BIOPSY     PORT-A-CATH REMOVAL     PORTACATH PLACEMENT     TOOTH EXTRACTION Bilateral 05/27/2017   Procedure: MULTIPLE EXTRACTIONS;  Surgeon: Diona Browner, DDS;  Location: [place];  Service: Oral Surgery;  Laterality: Bilateral;    OB History     Gravida  2   Para  1   Term       Preterm  1   AB  1   Living  0      SAB  1   IAB      Ectopic      Multiple      Live Births  0        Obstetric Comments  G1: PPROM, PTL          Home Medications    Prior to Admission medications   Medication Sig Start Date End Date Taking? Authorizing Provider  albuterol (VENTOLIN HFA) 108 (90 Base) MCG/ACT inhaler Inhale 1-2 puffs into the lungs every 6 (six) hours as needed for wheezing or shortness of breath. 06/03/20  Yes [name], [name], [name]  Cetirizine HCl 10 MG CAPS Take 1 capsule (10 mg total) by mouth daily for 10 days. 06/03/20 12/25/20 Yes [name], [name], [name]  lisinopril-hydrochlorothiazide (PRINZIDE,ZESTORETIC) 20-25 MG tablet Take 1 tablet by mouth daily. 03/15/17  Yes [name], [name], MD  LORAZEPAM PO Take by mouth.   Yes [provider]  metFORMIN (GLUCOPHAGE) 500 MG tablet Take 500 mg by mouth 3 (three) times daily.   Yes [provider]  nitroGLYCERIN (NITROSTAT) 0.4 MG SL tablet Place 0.4 mg under the tongue every 5 (five) minutes as needed for chest  pain.   Yes [provider]  pantoprazole (PROTONIX) 40 MG tablet Take 40 mg by mouth daily as needed (for reflux symptoms).  06/06/15  Yes [provider]  blood glucose meter kit and supplies KIT Dispense based on patient and insurance preference. Use up to four times daily as directed. (FOR ICD-9 250.00, 250.01). 04/13/20   [name], [name], [name]  medroxyPROGESTERone (PROVERA) 10 MG tablet Take 1 tablet (10 mg total) by mouth daily for 14 days. Use every month. 12/14/18 12/25/20  [name], MD  amLODipine (NORVASC) 5 MG tablet Take 1 tablet (5 mg total) by mouth daily. 01/01/19 04/13/20  [name], MD    Family History Family History  Problem Relation Age of Onset   Lung cancer Mother    Heart attack Father     Social History Social History   Tobacco Use   Smoking status: Every Day    Packs/day: 2.00    Types: Cigarettes    Smokeless tobacco: Never  Vaping Use   Vaping Use: Never used  Substance Use Topics   Alcohol use: No   Drug use: No     Allergies   Robitussin dm max day-night, Analgesic balm [trolamine (triethanolamine)], Bengay pain relief [menthol], Guaifenesin, and Latex   Review of Systems Review of Systems Pertinent negatives listed in HPI   Physical Exam Triage Vital Signs ED Triage Vitals  Enc Vitals Group     BP 12/25/20 1324 (!) 155/93     Pulse Rate 12/25/20 1324 71     Resp 12/25/20 1324 18     Temp 12/25/20 1324 98.4 F (36.9 C)     Temp Source 12/25/20 1324 Oral     SpO2 12/25/20 1324 96 %     Weight --      Height --      Head Circumference --      Peak Flow --      Pain Score 12/25/20 1321 9     Pain Loc --      Pain Edu? --      Excl. in [place]? --    No data found.  Updated Vital Signs BP (!) 155/93 (BP Location: Left Arm)   Pulse 71   Temp 98.4 F (36.9 C) (Oral)   Resp 18   LMP 12/14/2020   SpO2 96%   Visual Acuity Right Eye Distance:   Left Eye Distance:   Bilateral Distance:    Right Eye Near:   Left Eye Near:    Bilateral Near:     Physical Exam Constitutional: Patient appears well-developed and well-nourished. No distress. HENT: Normocephalic, atraumatic, mucosal edema with rhinorrhea and congestion, external right and left ear normal. Oropharynx is clear and moist.  Eyes: Conjunctivae and EOM are normal. PERRLA, no scleral icterus. Neck: Normal ROM. Neck supple. No JVD. No tracheal deviation.  CVS: RRR, S1/S2 +, no murmurs, no gallops, no carotid bruit.  Pulmonary: Effort and breath sounds normal, no stridor, rhonchi, wheezes, rales.  Abdominal: Soft. BS +, no distension, tenderness, rebound or guarding.  Musculoskeletal: Normal range of motion.  Neuro: Alert. Normal reflexes, normal muscle tone coordination, normal strength, w/o any focal abnormalities. Skin: Skin is warm and dry. No rash noted. Not diaphoretic. No erythema. No  pallor. Psychiatric: Normal mood and affect. Behavior, judgment, thought content normal.  UC Treatments / Results  Labs (all labs ordered are listed, but only abnormal results are displayed) Labs Reviewed - No data to display  EKG  Radiology No results found.  Procedures Procedures (including critical care time)  Medications Ordered in UC Medications - No data to display  Initial Impression / Assessment and Plan / UC Course  I have reviewed the triage vital signs and the nursing notes.  Pertinent labs & imaging results that were available during my care of the patient were reviewed by me and considered in my medical decision making (see chart for details).    Recurrent headache suspect prolonged headaches may be related to overuse of NSAIDs which can cause rebound headache.  Advised to discontinue use of any NSAIDs for now.  Tizanidine as prescribed for headache and neck pain.  Force fluids.  Advised if headaches persist or do not readily improve with medication follow-up with PCP for further work-up and evaluation. Final Clinical Impressions(s) / UC Diagnoses   Final diagnoses:  Recurrent headache  Paresthesias right upper extremity     Discharge Instructions      If headaches persist follow-up with Primary Care provider for further work-up of headaches.    ED Prescriptions   None    PDMP not reviewed this encounter.   [name], [name] 12/25/20 [phone]

## 2020-12-25 NOTE — ED Triage Notes (Signed)
Pt c/o headache from her forehead to the base of her neck x 1 month. She also has right side breast pain and right arm numbness and tingling.

## 2020-12-25 NOTE — Discharge Instructions (Addendum)
If headaches persist follow-up with Primary Care provider for further work-up of headaches.

## 2021-01-06 ENCOUNTER — Other Ambulatory Visit: Payer: Self-pay | Admitting: *Deleted

## 2021-01-06 NOTE — Patient Outreach (Addendum)
Medicaid Managed Care   Nurse Care Manager Note  01/06/2021 Name:  Bridget Scott MRN:  [id number] DOB:  November 04, 1980  Bridget Scott is an 40 y.o. year old female who is a primary patient of Augusto Gamble, DO.  The [facility] Managed Care Coordination team was consulted for assistance with:    HTN DMI Anxiety Depression Obesity  Ms. Bridget Scott was given information about Medicaid Managed Care Coordination team services today. Bridget Scott Patient agreed to services and verbal consent obtained.  Engaged with patient by telephone for initial visit in response to provider referral for case management and/or care coordination services.   Assessments/Interventions:  Review of past medical history, allergies, medications, health status, including review of consultants reports, laboratory and other test data, was performed as part of comprehensive evaluation and provision of chronic care management services.  SDOH (Social Determinants of Health) assessments and interventions performed: SDOH Interventions    Flowsheet Row Most Recent Value  SDOH Interventions   Food Insecurity Interventions --  [referred to BSW]  Financial Strain Interventions Other (Comment)  [referred to pharmacist for mail order]  Housing Interventions Other (Comment)  [referred to BSW]  Intimate Partner Violence Interventions Intervention Not Indicated  Physical Activity Interventions Other (Comments)  [patient states she has horrible bone pain with prolonged walking or standing]  Stress Interventions Patient Refused  [referred to LCSW]  Transportation Interventions Other (Comment)  [referred to [employer] fro transportation resources]       Care Plan  Allergies  Allergen Reactions   Robitussin Dm Max Day-Night Hives and Itching    Other reaction(s): Dizziness, Flushing, Vomiting   Analgesic Balm [Trolamine (Triethanolamine)] Rash   Bengay Pain Relief [Menthol] Rash   Guaifenesin Hives, Rash, Other (See Comments)  and Nausea And Vomiting    "Made me sick" (Robitussin)   Latex Rash    Medications Reviewed Today     Reviewed by Bridget Ellison, RN (Registered Nurse) on 01/06/21 at 1358  Med List Status: <None>   Medication Order Taking? Sig Documenting Provider Last Dose Status Informant  albuterol (VENTOLIN HFA) 108 (90 Base) MCG/ACT inhaler [id number] Yes Inhale 1-2 puffs into the lungs every 6 (six) hours as needed for wheezing or shortness of breath. [name], [name], PA-Scott Taking Active            Med Note Bridget Scott   Tue Jan 06, 2021  1:47 PM) Used it last night 01/06/21 due to coughing jag    Discontinued 04/13/20 1030   blood glucose meter kit and supplies KIT [id number] Yes Dispense based on patient and insurance preference. Use up to four times daily as directed. (FOR ICD-9 250.00, 250.01). Bridget Scott C, PA-Scott Taking Active   busPIRone (BUSPAR) 5 MG tablet [id number] Yes buspirone 5 mg tablet  TAKE 1 TABLET BY MOUTH THREE TIMES A DAY. [provider] Taking Active   Cetirizine HCl 10 MG CAPS [id number]  Take 1 capsule (10 mg total) by mouth daily for 10 days. [name], Bridget C, PA-Scott  Expired 12/25/20 2359   lisinopril-hydrochlorothiazide (PRINZIDE,ZESTORETIC) 20-25 MG tablet [id number] Yes Take 1 tablet by mouth daily. [name], Bridget Allegra, MD Taking Active Self           Med Note Bridget Scott   Tue Jan 06, 2021  1:52 PM) 3-4 times week   LORAZEPAM PO [id number] No Take by mouth.  Patient not taking: Reported on 01/06/2021   [provider] Not Taking Active   medroxyPROGESTERone (PROVERA)  10 MG tablet [id number]  Take 1 tablet (10 mg total) by mouth daily for 14 days. Use every month. Bridget Halim, MD  Expired 12/25/20 2359   metFORMIN (GLUCOPHAGE) 500 MG tablet [id number] Yes Take 500 mg by mouth 3 (three) times daily. [provider] Taking Active Self           Med Note Bridget Scott, [name]   Tue Jan 06, 2021  1:56 PM) Takes twice a day  nitroGLYCERIN  (NITROSTAT) 0.4 MG SL tablet [id number] Yes Place 0.4 mg under the tongue every 5 (five) minutes as needed for chest pain. [provider] Taking Active   pantoprazole (PROTONIX) 40 MG tablet [id number] Yes Take 40 mg by mouth daily as needed (for reflux symptoms).  [provider] Taking Active Self           Med Note Bridget Scott, [name]   Fri Oct 17, 2015  1:34 AM)    tiZANidine (ZANAFLEX) 4 MG tablet [id number] Yes Take 1 tablet (4 mg total) by mouth every 6 (six) hours as needed for muscle spasms. Bridget Jun, [name] Taking Active             Patient Active Problem List   Diagnosis Date Noted   Strain of knee 06/15/2019   History of trauma 03/21/2019   Insomnia 03/21/2019   Major depressive disorder, recurrent episode, severe with anxious distress (Bridget Scott) 03/21/2019   Panic attacks 03/21/2019   Amenorrhea 11/16/2018   Tobacco use disorder 05/27/2017   Hypertension    Diabetes mellitus without complication ([place])    Asthma    GERD (gastroesophageal reflux disease)    Hypertensive urgency    Syncope 07/23/2015   Obesity 04/26/2013   Hodgkin's disease in remission [facility]) 10/03/2012     Care Plan : RN Care Manager Plan Of Care  Updates made by Bridget Ellison, RN since 01/06/2021 12:00 AM     Problem: Knowledge Deficit and Care Coordination Needs Related to Management of Asthma, HTN, DM, Obesity, Anxiety/Depression   Priority: High     Long-Range Goal: Development of Plan Of Care to Address Care Coordination Needs and Knowledge Deficits for Management of HTN, DM, Asthma, Obesity, Anxiety/Depression   Start Date: 01/06/2021  Expected End Date: 05/11/2021  Priority: High  Note:   Current Barriers:  Knowledge Deficits related to plan of care for management of HTN, DMII, and Asthma, Obesity, Anxiety/Depression  Care Coordination needs related to Financial constraints related to limited income of $841 per month from SSI, Transportation, Limited access to food,  Medication procurement, Mental Health Concerns , and Substance abuse issues -  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  Transportation barriers Knowledge deficit related to health insurance benefits  RNCM Clinical Goal(s):  Patient will verbalize understanding of plan for management of HTN, DMII, Anxiety, Depression, Asthma, and HTN, DM,  Asthma, Obesity, Anxiety/Depression as evidenced by improved BP, A1C and CBG readings, no asthma exacerbations requiring ED of visit of hospitalization and no weight gain and patient reports of decreased anxiety and depression take all medications exactly as prescribed and will call provider for medication related questions as evidenced by patient reporting no missed doses of medication and keeping telephone appointment with managed Medicaid pharmacist    attend all scheduled medical appointments:   as evidenced by no cancelled appointments and by making routine follow up appointments per standards of care for chronic disease states         demonstrate improved adherence to prescribed treatment plan  for HTN, DMII, Anxiety, Depression, and Asthma as evidenced by improved BP, A1C, CBG readings, no asthma exacerbations requiring Ed visit or hospitalizations, no weight gain, and patient will report decreased levels of anxiety and depression  continue to work with Consulting civil engineer and/or Social Worker to address care management and care coordination needs related to HTN, DMII, Anxiety, Depression, Asthma, and HTN, DM, Asthma, Obesity, Anxiety/Depression as evidenced by adherence to CM Team Scheduled appointments     work with pharmacist to address Medication procurement related to HTN, DMII, Anxiety, Depression, and Asthma as evidenced by review of EMR and patient or pharmacist report    work with Education officer, museum to address Financial constraints related to limited income of $841 per month via SSI, Transportation, and Limited access to food related to the management of HTN, DMII,  Anxiety, Depression, and Asthma as evidenced by review of EMR and patient or Education officer, museum report      Work with LCSW to address concerns related to worsening depression and anxiety Patient will call [place] member services to discuss plan benefits specific to her chronic health needs Referral to pharmacist for smoking cessation assistance, and to arrange mail order medications to reduce fuel expenses Interventions: Inter-disciplinary care team collaboration (see longitudinal plan of care) Evaluation of current treatment plan related to  self management and patient's adherence to plan as established by provider Mailed patient health education, summary of health plan benefits with the benefits specific to the patient's  needs highlighted in yellow  and [place] Management calendar   Asthma: (Status: New goal.) Long Term Goal  Provided patient with basic written and verbal Asthma education on self care/management/and exacerbation prevention; Provided education about and advised patient to utilize infection prevention strategies to reduce risk of respiratory infection; Discussed benefits of health plan related to asthma- i.e.up to $150 for hypoallergenic mattress cover and pillowcase  Diabetes:  (Status: New goal.) Long Term Goal   Lab Results  Component Value Date   HGBA1C 6.4 (H) 05/27/2017   HGBA1C 6.5 (H) 05/27/2017   Lab Results  Component Value Date   CREATININE 0.58 05/28/2017    Assessed patient's understanding of A1c goal: <7% Provided education to patient about basic DM disease process; Reviewed medications with patient and discussed importance of medication adherence;        Reviewed prescribed diet with patient CHO controlled; mailed patient brochure entitled " Planning Healthy Meals" ; Counseled on importance of regular laboratory monitoring as prescribed;        Discussed plans with patient for ongoing care management follow up and provided patient  with direct contact information for care management team;      Advised patient, providing education and rationale, to check cbg anytime she does not feel well  and record        Review of patient status, including review of consultants reports, relevant laboratory and other test results, and medications completed;        Hypertension: (Status: New goal.) Long Term Goal Last practice recorded BP readings:  BP Readings from Last 3 Encounters:  12/25/20 (!) 155/93  06/03/20 (!) 171/110  05/22/20 (!) 170/95  Most recent eGFR/CrCl: No results found for: EGFR  No components found for: CRCL  Evaluation of current treatment plan related to hypertension self management and patient's adherence to plan as established by provider;   Provided education to patient re: stroke prevention, s/s of heart attack and stroke; Reviewed prescribed diet CHO controlled, no added  salt  Reviewed medications with patient and discussed importance of compliance;  Provided assistance with obtaining home blood pressure monitor via calling health plan's member services and ask for home BP monitor to be mailed to her home address;  Discussed plans with patient for ongoing care management follow up and provided patient with direct contact information for care management team; Discussed complications of poorly controlled blood pressure such as heart disease, stroke, circulatory complications, vision complications, kidney impairment, sexual dysfunction;  Discuss possibility cause of chronic cough as side effect of ACE inhibitor with provider   Smoking Cessation: (Status: New goal.) Long Term Goal  Reviewed smoking history:  tobacco abuse of 20 years; currently smoking 1/2 ppd Reports triggers to smoke include: increased stress Discussed health plan's benefits related to smoking cessation  Evaluation of current treatment plan reviewed; Advised patient to discuss smoking cessation options with provider; Provided contact  information for [place] Quit Line (1-800-QUIT-NOW); Referred patient to pharmacy team; Discussed plans with patient for ongoing care management follow up and provided patient with direct contact information for care management team;  Weight Loss:  (Status: New goal.) Long Term Goal Advised patient to discuss with primary care provider options regarding weight management;  Provided verbal and/or written education to patient re: provider recommended life style modifications;   Interdisciplinary Collaboration:  (Status: New goal.) Long Term Goal  Collaborated with BSW to initiate plan of care to address needs related to Financial constraints related to limited income, Transportation, and Limited access to food , assistance with utility bills in patient with HTN, DM, Anxiety, Depression, and asthma Inter-disciplinary care team collaboration (see longitudinal plan of care)   Anxiety/Depression  (Status:  New goal.)  Long Term Goal Evaluation of current treatment plan related to Anxiety and Depression, Lacks knowledge of community resource: related to behavioral health resources  self-management and patient's adherence to plan as established by provider. Discussed plans with patient for ongoing care management follow up and provided patient with direct contact information for care management team LCSW /Social Work referral for counseling and psychiatric resources and mental health support to manage anxiety and depression    Patient Goals/Self-Care Activities: Take medications as prescribed   Attend all scheduled provider appointments Call pharmacy for medication refills 3-7 days in advance of running out of medications Perform all self care activities independently  Perform IADL's (shopping, preparing meals, housekeeping, managing finances) independently Call provider office for new concerns or questions  Work with the Education officer, museum, LCSW and pharmacist to address care coordination needs and will  continue to work with the clinical team to address health care and disease management related needs check blood sugar at prescribed times: when you have symptoms of low or high blood sugar check blood pressure weekly call doctor for signs and symptoms of high blood pressure take medications for blood pressure exactly as prescribed Call member services at [facility] to discuss benefits specific to your chronic health needs       Follow Up:  Patient agrees to Care Plan and Follow-up.  Plan: The Managed Medicaid care management team will reach out to the patient again over the next 30 days.  Date/time of next scheduled RN care management/care coordination outreach:  02/03/21 at 1:00 pm   Kelli Churn RN, CCM, [place] Management Coordinator - Managed [place] High Risk [phone]

## 2021-01-06 NOTE — Patient Instructions (Signed)
Visit Information ° °Ms. Kihn was given information about Medicaid Managed Care team care coordination services as a part of their [employer] Community [employer] Medicaid benefit. Shauni M Laham verbally consented to engagement with the Medicaid Managed Care team.  ° °If you are experiencing a medical emergency, please call 911 or report to your local emergency department or urgent care.  ° °If you have a non-emergency medical problem during routine business hours, please contact your provider's office and ask to speak with a nurse.  ° °For questions related to your [employer] Medicaid, please call: [phone] or visit the homepage here: [email or web address] ° °If you would like to schedule transportation through your [employer] Community [employer] Medicaid, please call the following number at least 2 days in advance of your appointment: [phone].  ° °Call the Behavioral Health Crisis Line at 1-877-334-1141, at any time, 24 hours a day, 7 days a week. If you are in danger or need immediate medical attention call 911. ° °If you would like help to quit smoking, call 1-800-QUIT-NOW (1-800-784-8669) OR Español: 1-855-Déjelo-Ya (1-855-335-3569) o para más información haga clic aquí or Text READY to 200-400 to register via text ° °Ms. Hinshaw - following are the goals we discussed in your visit today:  ° Goals Addressed   °Take medications as prescribed   °Attend all scheduled provider appointments °Call pharmacy for medication refills 3-7 days in advance of running out of medications °Perform all self care activities independently  °Perform IADL's (shopping, preparing meals, housekeeping, managing finances) independently °Call provider office for new concerns or questions  °Work with the social worker. LCSW  and pharmacist to address care coordination needs and will continue to work with the clinical team to address health care and disease  management related needs °check blood sugar at prescribed times: when you have symptoms of low or high blood sugar °check blood pressure weekly °call doctor for signs and symptoms of high blood pressure ° ° °Please see education materials related to asthma, DM, Planning Healthy Meals, HTN, Insomnia, depression, anxiety and smoking provided as print materials.  ° °Print copy of patient instructions provided.  ° °The Managed Medicaid care management team will reach out to the patient again over the next 30 days.  ° °Janet Hauser RN, CCM, CDCES °[place]   [facility] °Care Management Coordinator - Managed Medicaid High Risk °[phone]  ° °Following is a copy of your plan of care:  °Care Plan : RN Care Manager Plan Of Care  °Updates made by Hauser, Janet S, RN since 01/06/2021 12:00 AM  °  ° °Problem: Knowledge Deficit and Care Coordination Needs Related to Management of Asthma, HTN, DM, Obesity, Anxiety/Depression   °Priority: High  °  ° °Long-Range Goal: Development of Plan Of Care to Address Care Coordination Needs and Knowledge Deficits for Management of HTN, DM, Asthma, Obesity, Anxiety/Depression   °Start Date: 01/06/2021  °Expected End Date: 05/11/2021  °Priority: High  °Note:   °Current Barriers:  °Knowledge Deficits related to plan of care for management of HTN, DMII, and Asthma, Obesity, Anxiety/Depression  °Care Coordination needs related to Financial constraints related to limited income of $841 per month from SSI, Transportation, Limited access to food, Medication procurement, Mental Health Concerns , and Substance abuse issues -  smokes 1/2 PPD  °Financial Constraints.  °Transportation barriers °Knowledge deficit related to health insurance benefits ° °RNCM Clinical Goal(s):  °Patient will verbalize understanding of plan for management of HTN, DMII, Anxiety, Depression,   Asthma, and HTN, DM,  Asthma, Obesity, Anxiety/Depression as evidenced by improved BP, A1C and CBG readings, no asthma  exacerbations requiring ED of visit of hospitalization and no weight gain and patient reports of decreased anxiety and depression take all medications exactly as prescribed and will call provider for medication related questions as evidenced by patient reporting no missed doses of medication and keeping telephone appointment with managed Medicaid pharmacist    attend all scheduled medical appointments:   as evidenced by no cancelled appointments and by making routine follow up appointments per standards of care for chronic disease states         demonstrate improved adherence to prescribed treatment plan for HTN, DMII, Anxiety, Depression, and Asthma as evidenced by improved BP, A1C, CBG readings, no asthma exacerbations requiring Ed visit or hospitalizations, no weight gain, and patient will report decreased levels of anxiety and depression  continue to work with Consulting civil engineer and/or Social Worker to address care management and care coordination needs related to HTN, DMII, Anxiety, Depression, Asthma, and HTN, DM, Asthma, Obesity, Anxiety/Depression as evidenced by adherence to CM Team Scheduled appointments     work with pharmacist to address Medication procurement related to HTN, DMII, Anxiety, Depression, and Asthma as evidenced by review of EMR and patient or pharmacist report    work with Education officer, museum to address Financial constraints related to limited income of $841 per month via SSI, Transportation, and Limited access to food related to the management of HTN, DMII, Anxiety, Depression, and Asthma as evidenced by review of EMR and patient or Education officer, museum report      Work with LCSW to address concerns related to worsening depression and anxiety Patient will call [place] member services to discuss plan benefits specific to her chronic health needs Referral to pharmacist for smoking cessation assistance, and to arrange mail order medications to reduce fuel  expenses Interventions: Inter-disciplinary care team collaboration (see longitudinal plan of care) Evaluation of current treatment plan related to  self management and patient's adherence to plan as established by provider Mailed patient health education, summary of health plan benefits with the benefits specific to the patient's  needs highlighted in yellow  and [employer] Management calendar   Asthma: (Status: New goal.) Long Term Goal  Provided patient with basic written and verbal Asthma education on self care/management/and exacerbation prevention; Provided education about and advised patient to utilize infection prevention strategies to reduce risk of respiratory infection; Discussed benefits of health plan related to asthma- i.e.up to $150 for hypoallergenic mattress cover and pillowcase  Diabetes:  (Status: New goal.) Long Term Goal   Lab Results  Component Value Date   HGBA1C 6.4 (H) 05/27/2017   HGBA1C 6.5 (H) 05/27/2017   Lab Results  Component Value Date   CREATININE 0.58 05/28/2017    Assessed patient's understanding of A1c goal: <7% Provided education to patient about basic DM disease process; Reviewed medications with patient and discussed importance of medication adherence;        Reviewed prescribed diet with patient CHO controlled; mailed patient brochure entitled " Planning Healthy Meals" ; Counseled on importance of regular laboratory monitoring as prescribed;        Discussed plans with patient for ongoing care management follow up and provided patient with direct contact information for care management team;      Advised patient, providing education and rationale, to check cbg anytime she does not feel well  and record  Review of patient status, including review of consultants reports, relevant laboratory and other test results, and medications completed;       ° °Hypertension: (Status: New goal.) Long Term Goal °Last practice recorded BP  readings:  °BP Readings from Last 3 Encounters:  °12/25/20 (!) 155/93  °06/03/20 (!) 171/110  °05/22/20 (!) 170/95  °Most recent eGFR/CrCl: No results found for: EGFR  No components found for: CRCL ° °Evaluation of current treatment plan related to hypertension self management and patient's adherence to plan as established by provider;   °Provided education to patient re: stroke prevention, s/s of heart attack and stroke; °Reviewed prescribed diet CHO controlled, no added salt  °Reviewed medications with patient and discussed importance of compliance;  °Provided assistance with obtaining home blood pressure monitor via calling health plan's member services and ask for home BP monitor to be mailed to her home address;  °Discussed plans with patient for ongoing care management follow up and provided patient with direct contact information for care management team; °Discussed complications of poorly controlled blood pressure such as heart disease, stroke, circulatory complications, vision complications, kidney impairment, sexual dysfunction;  °Discuss possibility cause of chronic cough as side effect of ACE inhibitor with provider  ° °Smoking Cessation: (Status: New goal.) Long Term Goal  °Reviewed smoking history:  °tobacco abuse of 20 years; currently smoking 1/2 ppd °Reports triggers to smoke include: increased stress °Discussed health plan's benefits related to smoking cessation ° °Evaluation of current treatment plan reviewed; °Advised patient to discuss smoking cessation options with provider; °Provided contact information for [place] Quit Line (1-800-QUIT-NOW); °Referred patient to pharmacy team; °Discussed plans with patient for ongoing care management follow up and provided patient with direct contact information for care management team; ° °Weight Loss:  (Status: New goal.) Long Term Goal °Advised patient to discuss with primary care provider options regarding weight management;  °Provided verbal and/or written  education to patient re: provider recommended life style modifications;  ° °Interdisciplinary Collaboration:  (Status: New goal.) Long Term Goal  °Collaborated with BSW to initiate plan of care to address needs related to Financial constraints related to limited income, Transportation, and Limited access to food , assistance with utility bills in patient with HTN, DM, Anxiety, Depression, and asthma °Inter-disciplinary care team collaboration (see longitudinal plan of care) ° ° °Anxiety/Depression  (Status:  New goal.)  Long Term Goal °Evaluation of current treatment plan related to Anxiety and Depression, Lacks knowledge of community resource: related to behavioral health resources  self-management and patient's adherence to plan as established by provider. °Discussed plans with patient for ongoing care management follow up and provided patient with direct contact information for care management team °LCSW /Social Work referral for counseling and psychiatric resources and mental health support to manage anxiety and depression   ° °Patient Goals/Self-Care Activities: °Take medications as prescribed   °Attend all scheduled provider appointments °Call pharmacy for medication refills 3-7 days in advance of running out of medications °Perform all self care activities independently  °Perform IADL's (shopping, preparing meals, housekeeping, managing finances) independently °Call provider office for new concerns or questions  °Work with the social worker. LCSW  and pharmacist to address care coordination needs and will continue to work with the clinical team to address health care and disease management related needs °check blood sugar at prescribed times: when you have symptoms of low or high blood sugar °check blood pressure weekly °call doctor for signs and symptoms of high blood pressure °take medications for blood   pressure exactly as prescribed Call member services at [facility] to discuss benefits  specific to your chronic health needs

## 2021-01-07 ENCOUNTER — Telehealth: Payer: Self-pay | Admitting: Family Medicine

## 2021-01-07 NOTE — Telephone Encounter (Signed)
.. °  Medicaid Managed Care   Unsuccessful Outreach Note  01/07/2021 Name: Bridget Scott MRN: [id number] DOB: 27-Mar-1980  Referred by: Augusto Gamble, DO Reason for referral : High Risk Managed Medicaid (I called the patient today to get her scheduled with the MM BSW, LCSW and the Pharmacist. I left my name and number on her VM.)   An unsuccessful telephone outreach was attempted today. The patient was referred to the case management team for assistance with care management and care coordination.   Follow Up Plan: The care management team will reach out to the patient again over the next 7 days.   [place]

## 2021-01-13 ENCOUNTER — Other Ambulatory Visit: Payer: Self-pay

## 2021-01-13 NOTE — Patient Outreach (Signed)
Care Coordination  01/13/2021  [name] 01/23/1981 [id number]   Medicaid Managed Care   Unsuccessful Outreach Note  01/13/2021 Name: Bridget Scott MRN: [id number] DOB: [birth year]  Referred by: Augusto Gamble, DO Reason for referral : High Risk Managed Medicaid (MM Social Work [employer])   An unsuccessful telephone outreach was attempted today. The patient was referred to the case management team for assistance with care management and care coordination.   Follow Up Plan: The care management team will reach out to the patient again over the next 7 days.   Mickel Fuchs, BSW, [place] Managed Medicaid Team  [phone]

## 2021-01-13 NOTE — Patient Outreach (Signed)
Medicaid Managed Care Social Work Note  01/13/2021 Name:  Bridget Scott MRN:  [id number] DOB:  Jun 05, 1980  Bridget Scott is an 40 y.o. year old female who is a primary patient of Augusto Gamble, DO.  The Medicaid Managed Care Coordination team was consulted for assistance with:  Community Resources   Ms. Aslinger was given information about Medicaid Managed Care Coordination team services today. Bridget Scott Patient agreed to services and verbal consent obtained.  Engaged with patient  for by telephone forinitial visit in response to referral for case management and/or care coordination services.   Assessments/Interventions:  Review of past medical history, allergies, medications, health status, including review of consultants reports, laboratory and other test data, was performed as part of comprehensive evaluation and provision of chronic care management services.  SDOH: (Social Determinant of Health) assessments and interventions performed: Patient returned BSW telephone call. Patient stated she does have her appointments at [facility] but was unaware that her insurance did provide transportation. BSW informed patient she can provide her with the telephone number for [facility] transportation or she could check on the back of her card. Patient checked on the back of her card and now has the phone number. Patient stated she is familiar with food pantries and knows where to go. Patient stated she also needed resources for rent and utilities. Patient stated she would like those resources to be emailed to [email or web address], email sent. No other resources are needed at this time.  Advanced Directives Status:  Not addressed in this encounter.  Care Plan                 Allergies  Allergen Reactions   Robitussin Dm Max Day-Night Hives and Itching    Other reaction(s): Dizziness, Flushing, Vomiting   Analgesic Balm [Trolamine (Triethanolamine)] Rash   Bengay Pain Relief [Menthol] Rash    Guaifenesin Hives, Rash, Other (See Comments) and Nausea And Vomiting    "Made me sick" (Robitussin)   Latex Rash    Medications Reviewed Today     Reviewed by [name], RN (Registered Nurse) on 01/06/21 at 1358  Med List Status: <None>   Medication Order Taking? Sig Documenting Provider Last Dose Status Informant  albuterol (VENTOLIN HFA) 108 (90 Base) MCG/ACT inhaler [id number] Yes Inhale 1-2 puffs into the lungs every 6 (six) hours as needed for wheezing or shortness of breath. [name], [place] C, [name] Taking Active            Med Note [name]   Tue Jan 06, 2021  1:47 PM) Used it last night 01/06/21 due to coughing jag    Discontinued 04/13/20 1030   blood glucose meter kit and supplies KIT [id number] Yes Dispense based on patient and insurance preference. Use up to four times daily as directed. (FOR ICD-9 250.00, 250.01). [name], [place] C, [name] Taking Active   busPIRone (BUSPAR) 5 MG tablet [id number] Yes buspirone 5 mg tablet  TAKE 1 TABLET BY MOUTH THREE TIMES A DAY. [provider] Taking Active   Cetirizine HCl 10 MG CAPS [id number]  Take 1 capsule (10 mg total) by mouth daily for 10 days. [name], [name], [name]  Expired 12/25/20 2359   lisinopril-hydrochlorothiazide (PRINZIDE,ZESTORETIC) 20-25 MG tablet [id number] Yes Take 1 tablet by mouth daily. [name], [name], MD Taking Active Self           Med Note [name]   Tue Jan 06, 2021  1:52 PM) 3-4 times week  LORAZEPAM PO [id number] No Take by mouth.  Patient not taking: Reported on 01/06/2021   [provider] Not Taking Active   medroxyPROGESTERone (PROVERA) 10 MG tablet [id number]  Take 1 tablet (10 mg total) by mouth daily for 14 days. Use every month. [name], MD  Expired 12/25/20 2359   metFORMIN (GLUCOPHAGE) 500 MG tablet [id number] Yes Take 500 mg by mouth 3 (three) times daily. [provider] Taking Active Self           Med Note [name], [name]   Tue Jan 06, 2021  1:56 PM) Takes twice a day  nitroGLYCERIN (NITROSTAT) 0.4 MG SL tablet [id number] Yes Place 0.4 mg under the tongue every 5 (five) minutes as needed for chest pain. [provider] Taking Active   pantoprazole (PROTONIX) 40 MG tablet [id number] Yes Take 40 mg by mouth daily as needed (for reflux symptoms).  [provider] Taking Active Self           Med Note [name], [name]   Fri Oct 17, 2015  1:34 AM)    tiZANidine (ZANAFLEX) 4 MG tablet [id number] Yes Take 1 tablet (4 mg total) by mouth every 6 (six) hours as needed for muscle spasms. [name], [name] Taking Active             Patient Active Problem List   Diagnosis Date Noted   Strain of knee 06/15/2019   History of trauma 03/21/2019   Insomnia 03/21/2019   Major depressive disorder, recurrent episode, severe with anxious distress ([place]) 03/21/2019   Panic attacks 03/21/2019   Amenorrhea 11/16/2018   Tobacco use disorder 05/27/2017   Hypertension    Diabetes mellitus without complication (HCC)    Asthma    GERD (gastroesophageal reflux disease)    Hypertensive urgency    Syncope 07/23/2015   Obesity 04/26/2013   Hodgkin's disease in remission ([place]) 10/03/2012    Conditions to be addressed/monitored per PCP order:   community resources  There are no care plans that you recently modified to display for this patient.   Follow up:  Patient agrees to Care Plan and Follow-up.  Plan: The Managed Medicaid care management team will reach out to the patient again over the next 30 days.  Date/time of next scheduled Social Work care management/care coordination outreach:  02/14/20  Mickel Fuchs, Arita Miss, [place] Medicaid Team  [phone]

## 2021-01-13 NOTE — Patient Instructions (Addendum)
Visit Information  Bridget Scott  - as a part of your Medicaid benefit, you are eligible for care management and care coordination services at no cost or copay. I was unable to reach you by phone today but would be happy to help you with your health related needs. Please feel free to call me @ [phone].   A member of the Managed Medicaid care management team will reach out to you again over the next 7 days.   Mickel Fuchs, BSW, [place] Managed Medicaid Team  [phone] Visit Information  Bridget Scott was given information about Medicaid Managed Care team care coordination services as a part of their [place] Medicaid benefit. Bridget Scott verbally consented to engagement with the [facility] Managed Care team.   If you are experiencing a medical emergency, please call 911 or report to your local emergency department or urgent care.   If you have a non-emergency medical problem during routine business hours, please contact your provider's office and ask to speak with a nurse.   For questions related to your [facility], please call: [phone] or visit the homepage here: [email or web address]  If you would like to schedule transportation through your [facility], please call the following number at least 2 days in advance of your appointment: [phone].   Call the [place] at [phone], at any time, 24 hours a day, 7 days a week. If you are in danger or need immediate medical attention call 911.  If you would like help to quit smoking, call 1-800-QUIT-NOW 801-522-0218) OR Espaol: 1-855-Djelo-Ya (6-015-615-3794) o para ms informacin haga clic aqu or Text READY to 200-400 to register via text  Bridget Scott - following are the goals we discussed in your visit today:   Goals  Addressed   None      Social Worker will follow up with patient in 30 days.   Mickel Fuchs, BSW, [place] Managed Medicaid Team  [phone]   Following is a copy of your plan of care:  There are no care plans that you recently modified to display for this patient.

## 2021-01-20 ENCOUNTER — Other Ambulatory Visit: Payer: Self-pay | Admitting: Licensed Clinical Social Worker

## 2021-01-20 ENCOUNTER — Other Ambulatory Visit: Payer: Self-pay

## 2021-01-20 ENCOUNTER — Ambulatory Visit
Admission: RE | Admit: 2021-01-20 | Discharge: 2021-01-20 | Disposition: A | Discharge status: Home / Self Care | Payer: Medicaid Other | Source: Ambulatory Visit | Attending: Emergency Medicine | Admitting: Emergency Medicine

## 2021-01-20 VITALS — BP 155/95 | HR 100 | Temp 98.5°F | Resp 20 | Ht 65.0 in | Wt 200.0 lb

## 2021-01-20 DIAGNOSIS — R059 Cough, unspecified: Secondary | ICD-10-CM

## 2021-01-20 DIAGNOSIS — J0100 Acute maxillary sinusitis, unspecified: Secondary | ICD-10-CM | POA: Diagnosis not present

## 2021-01-20 DIAGNOSIS — I10 Essential (primary) hypertension: Secondary | ICD-10-CM

## 2021-01-20 MED ORDER — AMOXICILLIN 875 MG PO TABS: 875.0000 mg | ORAL_TABLET | Freq: Two times a day (BID) | ORAL | 0 refills | Status: AC

## 2021-01-20 MED ORDER — PROMETHAZINE-DM 6.25-15 MG/5ML PO SYRP: 5.0000 mL | ORAL_SOLUTION | Freq: Every evening | ORAL | 0 refills | Status: DC | PRN

## 2021-01-20 NOTE — Patient Instructions (Signed)
Visit Information  Bridget Scott was given information about Medicaid Managed Care team care coordination services as a part of their [place] Medicaid benefit. Bridget Scott verbally consented to engagement with the [facility] Managed Care team.   If you are experiencing a medical emergency, please call 911 or report to your local emergency department or urgent care.   If you have a non-emergency medical problem during routine business hours, please contact your provider's office and ask to speak with a nurse.   For questions related to your [facility], please call: [phone] or visit the homepage here: [email or web address]  If you would like to schedule transportation through your [facility], please call the following number at least 2 days in advance of your appointment: [phone].   Call the [facility] at [phone], at any time, 24 hours a day, 7 days a week. If you are in danger or need immediate medical attention call 911.  If you would like help to quit smoking, call 1-800-QUIT-NOW 680-214-5919) OR Espaol: 1-855-Djelo-Ya (5-830-940-7680) o para ms informacin haga clic aqu or Text READY to 200-400 to register via text  Bridget Scott - following are the goals we discussed in your visit today:   Goals Addressed   None     Following is a copy of your plan of care:  Care Plan : General Social Work (Adult)  Updates made by Bridget Cutter, LCSW since 01/20/2021 12:00 AM     Problem: Depression Identification (Depression)      Long-Range Goal: Depressive Symptoms Identified   Start Date: 01/20/2021  Note:   Timeframe:  Long-Range Goal Priority:  High Start Date:   01/20/21                Expected End Date:  ongoing                     Follow Up Date--01/28/21   - check out counseling - keep 29 percent of counseling  appointments - schedule counseling appointment    Why is this important?             Beating depression may take some time.            If you don't feel better right away, don't give up on your treatment plan.    Current barriers:             Chronic Mental Health needs related to depression, stress and panic attacks           Mental Health Concerns            Needs Support, Education, and Care Coordination in order to meet unmet mental health needs. Clinical Goal(s): demonstrate a reduction in symptoms related to : Depression and panic attacks, connect with provider for ongoing mental health treatment.  , and increase coping skills, healthy habits, self-management skills, and stress reduction     Patient Goals/Self-Care Activities: Over the next 120 days           Call your insurance provider for more information about your Enhanced Benefits      24- Hour Availability:    [facility]  [address] [place], [place] [facility] [phone]   [facility] [employer] [place]  [phone]    [place]  [phone] (after hours)  [facility]/Mobile Crisis   1-754-067-7573   [place] National Suicide Hotline  406-256-0706 Diamantina Monks) OR 281-400-5689   Call 911 or go to emergency room   [facility]  [phone]);  [place] and [employer]  [phone]); [place], [place], [place], [place], [place], [place], [place]         10 LITTLE Things To Do When Youre Feeling Too Down To Do Anything  Take a shower. Even if you plan to stay in all day long and not see a soul, take a shower. It takes the most effort to hop in to the shower but once you do, youll feel immediate results. It will wake you up and youll be feeling much fresher (and cleaner too).  Brush and floss your teeth. Give your teeth a good brushing with a floss finish.  Its a small task but it feels so good and you can check taking care of your health off the list of things to do.  Do something small on your list. Most of [place] have some small thing we would like to get done (load of laundry, sew a button, email a friend). Doing one of these things will make you feel like youve accomplished something.  Drink water. Drinking water is easy right? Its also really beneficial for your health so keep a glass beside you all day and take sips often. It gives you energy and prevents you from boredom eating.  Do some floor exercises. The last thing you want to do is exercise but it might be just the thing you need the most. Keep it simple and do exercises that involve sitting or laying on the floor. Even the smallest of exercises release chemicals in the brain that make you feel good. Yoga stretches or core exercises are going to make you feel good with minimal effort.  Make your bed. Making your bed takes a few minutes but its productive and youll feel relieved when its done. An unmade bed is a huge visual reminder that youre having an unproductive day. Do it and consider it your housework for the day.  Put on some nice clothes. Take the sweatpants off even if you dont plan to go anywhere. Put on clothes that make you feel good. Take a look in the mirror so your brain recognizes the sweatpants have been replaced with clothes that make you look great. Its an instant confidence booster.  Wash the dishes. A pile of dirty dishes in the sink is a reflection of your mood. Its possible that if you wash up the dishes, your mood will follow suit. Its worth a try.  Cook a real meal. If you have the luxury to have a do nothing day, you have time to make a real meal for yourself. Make a meal that you love to eat. The process is good to get you out of the funk and the food will ensure you have more energy for tomorrow.  Write out your thoughts by hand. When you hand  write, you stimulate your brain to focus on the moment that youre in so make yourself comfortable and write whatever comes into your mind. Put those thoughts out on paper so they stop spinning around in your head. Those thoughts might be the very thing holding you down.   Bridget Scott, Bridget Scott, Bridget Scott, Bridget Scott Phone: [phone]

## 2021-01-20 NOTE — Discharge Instructions (Addendum)
Take the amoxicillin as directed.    Take the cough syrup as directed; do not drive, operate machinery, or drink alcohol with this medication as it may cause drowsiness.  Follow up with your primary care provider if your symptoms are not improving.    Your blood pressure is elevated today at 179/100.  Please have this rechecked by your primary care provider in 1-2 weeks.

## 2021-01-20 NOTE — ED Provider Notes (Signed)
[facility]    CSN: [id number] Arrival date & time: 01/20/21  1249      History   Chief Complaint Chief Complaint  Patient presents with   Appointment   Cough    HPI Bridget Scott is a 40 y.o. female.  Patient presents with 3-4 week history of sinus congestion, sinus pressure, postnasal drip, runny nose, cough productive of yellow phlegm.  She denies fever, rash, sore throat, shortness of breath, vomiting, diarrhea, or other symptoms.  Treatment at home with Mucinex, DayQuil, NyQuil.  Her medical history includes hypertension, diabetes, asthma.  The history is provided by the patient and medical records.   Past Medical History:  Diagnosis Date   Asthma    Cancer ([place])    lymphoma   Dental caries    periodontitis, maxillary cyst   Depression    Diabetes mellitus without complication ([place])    GERD (gastroesophageal reflux disease)    Headache    migraines   Hodgkin lymphoma ([place])    in remission as of 2020. 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   Panic attacks 03/21/2019   Amenorrhea 11/16/2018   Tobacco use disorder 05/27/2017   Hypertension    Diabetes mellitus without complication ([place])    Asthma    GERD (gastroesophageal reflux disease)    Hypertensive urgency    Syncope 07/23/2015   Obesity 04/26/2013   Hodgkin's disease in remission ([place]) 10/03/2012    Past Surgical History:  Procedure Laterality Date   CHOLECYSTECTOMY     LYMPH NODE BIOPSY     PORT-A-CATH REMOVAL     PORTACATH PLACEMENT     TOOTH EXTRACTION Bilateral 05/27/2017   Procedure: MULTIPLE EXTRACTIONS;  Surgeon: Diona Browner, DDS;  Location: [facility];  Service: Oral Surgery;   Laterality: Bilateral;    OB History     Gravida  2   Para  1   Term      Preterm  1   AB  1   Living  0      SAB  1   IAB      Ectopic      Multiple      Live Births  0        Obstetric Comments  G1: PPROM, PTL          Home Medications    Prior to Admission medications   Medication Sig Start Date End Date Taking? Authorizing Provider  albuterol (VENTOLIN HFA) 108 (90 Base) MCG/ACT inhaler Inhale 1-2 puffs into the lungs every 6 (six) hours as needed for wheezing or shortness of breath. 06/03/20  Yes [name], [name], [name]  amoxicillin (AMOXIL) 875 MG tablet Take 1 tablet (875 mg total) by mouth 2 (two) times daily for 10 days. 01/20/21 01/30/21 Yes Sharion Balloon, NP  blood glucose meter kit and supplies KIT Dispense based on patient and insurance preference. Use up to four times daily as directed. (FOR ICD-9 250.00, 250.01). 04/13/20  Yes [name], [name], [name]  busPIRone (BUSPAR) 5 MG tablet buspirone 5 mg tablet  TAKE 1 TABLET BY MOUTH THREE TIMES A DAY.  Yes [provider]  lisinopril-hydrochlorothiazide (PRINZIDE,ZESTORETIC) 20-25 MG tablet Take 1 tablet by mouth daily. 03/15/17  Yes [name], [name], MD  LORAZEPAM PO Take by mouth.   Yes [provider]  metFORMIN (GLUCOPHAGE) 500 MG tablet Take 500 mg by mouth 3 (three) times daily.   Yes [provider]  nitroGLYCERIN (NITROSTAT) 0.4 MG SL tablet Place 0.4 mg under the tongue every 5 (five) minutes as needed for chest pain.   Yes [provider]  pantoprazole (PROTONIX) 40 MG tablet Take 40 mg by mouth daily as needed (for reflux symptoms).  06/06/15  Yes [provider]  promethazine-dextromethorphan (PROMETHAZINE-DM) 6.25-15 MG/5ML syrup Take 5 mLs by mouth at bedtime as needed for cough. 01/20/21  Yes Sharion Balloon, NP  tiZANidine (ZANAFLEX) 4 MG tablet Take 1 tablet (4 mg total) by mouth every 6 (six) hours as needed for muscle spasms. 12/25/20  Yes  [name], [name]  Cetirizine HCl 10 MG CAPS Take 1 capsule (10 mg total) by mouth daily for 10 days. 06/03/20 12/25/20  [name], [name], [name]  medroxyPROGESTERone (PROVERA) 10 MG tablet Take 1 tablet (10 mg total) by mouth daily for 14 days. Use every month. 12/14/18 12/25/20  [name], MD  amLODipine (NORVASC) 5 MG tablet Take 1 tablet (5 mg total) by mouth daily. 01/01/19 04/13/20  [name], MD    Family History Family History  Problem Relation Age of Onset   Lung cancer Mother    Heart attack Father     Social History Social History   Tobacco Use   Smoking status: Every Day    Packs/day: 0.50    Types: Cigarettes   Smokeless tobacco: Never  Vaping Use   Vaping Use: Never used  Substance Use Topics   Alcohol use: No   Drug use: No     Allergies   Robitussin dm max day-night, Analgesic balm [trolamine (triethanolamine)], Bengay pain relief [menthol], Guaifenesin, and Latex   Review of Systems Review of Systems  Constitutional:  Negative for chills and fever.  HENT:  Positive for congestion, postnasal drip, rhinorrhea and sinus pressure. Negative for ear pain and sore throat.   Respiratory:  Positive for cough. Negative for shortness of breath.   Cardiovascular:  Negative for chest pain and palpitations.  Gastrointestinal:  Negative for diarrhea and vomiting.  Skin:  Negative for color change and rash.  All other systems reviewed and are negative.   Physical Exam Triage Vital Signs ED Triage Vitals [01/20/21 1303]  Enc Vitals Group     BP (!) 179/100     Pulse Rate 100     Resp 20     Temp 98.5 F (36.9 C)     Temp Source Oral     SpO2 96 %     Weight 200 lb (90.7 kg)     Height 5' 5" (1.651 m)     Head Circumference      Peak Flow      Pain Score 7     Pain Loc      Pain Edu?      Excl. in [place]?    No data found.  Updated Vital Signs BP (!) 155/95 (BP Location: Left Arm)    Pulse 100    Temp 98.5 F (36.9 C) (Oral)    Resp 20     Ht 5' 5" (1.651 m)    Wt 200 lb (90.7 kg)    LMP 01/13/2021  SpO2 96%    BMI 33.28 kg/m   Visual Acuity Right Eye Distance:   Left Eye Distance:   Bilateral Distance:    Right Eye Near:   Left Eye Near:    Bilateral Near:     Physical Exam Vitals and nursing note reviewed.  Constitutional:      General: She is not in acute distress.    Appearance: She is well-developed.  HENT:     Right Ear: Tympanic membrane normal.     Left Ear: Tympanic membrane normal.     Nose: Congestion and rhinorrhea present.     Mouth/Throat:     Mouth: Mucous membranes are moist.     Pharynx: Oropharynx is clear.  Cardiovascular:     Rate and Rhythm: Normal rate and regular rhythm.     Heart sounds: Normal heart sounds.  Pulmonary:     Effort: Pulmonary effort is normal. No respiratory distress.     Breath sounds: Normal breath sounds.  Musculoskeletal:     Cervical back: Neck supple.  Skin:    General: Skin is warm and dry.  Neurological:     Mental Status: She is alert.  Psychiatric:        Mood and Affect: Mood normal.        Behavior: Behavior normal.     UC Treatments / Results  Labs (all labs ordered are listed, but only abnormal results are displayed) Labs Reviewed - No data to display  EKG   Radiology No results found.  Procedures Procedures (including critical care time)  Medications Ordered in UC Medications - No data to display  Initial Impression / Assessment and Plan / UC Course  I have reviewed the triage vital signs and the nursing notes.  Pertinent labs & imaging results that were available during my care of the patient were reviewed by me and considered in my medical decision making (see chart for details).   Acute sinusitis, Cough, Elevated blood pressure with HTN.  Patient has been symptomatic for 3 to 4 weeks and is not improving with OTC medications. Treating with amoxicillin and promethazine-DM syrup.  Precautions for drowsiness with Promethazine-DM  discussed.  Instructed patient to follow-up with her PCP if her symptoms are not improving.  Also discussed that her blood pressure is elevated today and needs to be rechecked by her PCP in 1 to 2 weeks.  Education provided on managing hypertension.  She agrees to plan of care.   Final Clinical Impressions(s) / UC Diagnoses   Final diagnoses:  Acute non-recurrent maxillary sinusitis  Cough, unspecified type  Elevated blood pressure reading in office with diagnosis of hypertension     Discharge Instructions      Take the amoxicillin as directed.    Take the cough syrup as directed; do not drive, operate machinery, or drink alcohol with this medication as it may cause drowsiness.  Follow up with your primary care provider if your symptoms are not improving.    Your blood pressure is elevated today at 179/100.  Please have this rechecked by your primary care provider in 1-2 weeks.          ED Prescriptions     Medication Sig Dispense Auth. Provider   promethazine-dextromethorphan (PROMETHAZINE-DM) 6.25-15 MG/5ML syrup Take 5 mLs by mouth at bedtime as needed for cough. 30 mL Sharion Balloon, NP   amoxicillin (AMOXIL) 875 MG tablet Take 1 tablet (875 mg total) by mouth 2 (two) times daily for 10 days. [place]  tablet Sharion Balloon, NP      I have reviewed the PDMP during this encounter.   Sharion Balloon, NP 01/20/21 1341

## 2021-01-20 NOTE — Patient Outreach (Signed)
Medicaid Managed Care Social Work Note  01/20/2021 Name:  Bridget Scott MRN:  [id number] DOB:  [birth year]  Bridget Scott is an 40 y.o. year old female who is a primary patient of Augusto Gamble, DO.  The Medicaid Managed Care Coordination team was consulted for assistance with:  [place] and Resources  Ms. Roane was given information about Medicaid Managed Care Coordination team services today. [name] Patient agreed to services and verbal consent obtained.  Engaged with patient  for by telephone forinitial visit in response to referral for case management and/or care coordination services.   Assessments/Interventions:  Review of past medical history, allergies, medications, health status, including review of consultants reports, laboratory and other test data, was performed as part of comprehensive evaluation and provision of chronic care management services.  SDOH: (Social Determinant of Health) assessments and interventions performed:   Advanced Directives Status:  See Care Plan for related entries.  Care Plan                 Allergies  Allergen Reactions   Robitussin Dm Max Day-Night Hives and Itching    Other reaction(s): Dizziness, Flushing, Vomiting   Analgesic Balm [Trolamine (Triethanolamine)] Rash   Bengay Pain Relief [Menthol] Rash   Guaifenesin Hives, Rash, Other (See Comments) and Nausea And Vomiting    "Made me sick" (Robitussin)   Latex Rash    Medications Reviewed Today     Reviewed by Britta Mccreedy, CMA (Certified Medical Assistant) on 01/20/21 at [place] List Status: <None>   Medication Order Taking? Sig Documenting Provider Last Dose Status Informant  albuterol (VENTOLIN HFA) 108 (90 Base) MCG/ACT inhaler [id number]  Inhale 1-2 puffs into the lungs every 6 (six) hours as needed for wheezing or shortness of breath. [name], [name], [name]  Active            Med Note [name]   Tue Jan 06, 2021  1:47 PM) Used it last  night 01/06/21 due to coughing jag    Discontinued 04/13/20 1030   blood glucose meter kit and supplies KIT [id number]  Dispense based on patient and insurance preference. Use up to four times daily as directed. (FOR ICD-9 250.00, 250.01). [name], [name], [name]  Active   busPIRone (BUSPAR) 5 MG tablet [id number]  buspirone 5 mg tablet  TAKE 1 TABLET BY MOUTH THREE TIMES A DAY. [provider]  Active   Cetirizine HCl 10 MG CAPS [id number]  Take 1 capsule (10 mg total) by mouth daily for 10 days. [name], [name], [name]  Expired 12/25/20 2359   lisinopril-hydrochlorothiazide (PRINZIDE,ZESTORETIC) 20-25 MG tablet [id number]  Take 1 tablet by mouth daily. [name], [name], MD  Active Self           Med Note [name]   Tue Jan 06, 2021  1:52 PM) 3-4 times week   LORAZEPAM PO [id number]  Take by mouth.  Patient not taking: Reported on 01/06/2021   [provider]  Active   medroxyPROGESTERone (PROVERA) 10 MG tablet [id number]  Take 1 tablet (10 mg total) by mouth daily for 14 days. Use every month. [name], MD  Expired 12/25/20 2359   metFORMIN (GLUCOPHAGE) 500 MG tablet [id number]  Take 500 mg by mouth 3 (three) times daily. [provider]  Active Self           Med Note [name] Jan 06, 2021  2:56 PM) [name]  it twice daily, feels 3 times daily is too much medication  nitroGLYCERIN (NITROSTAT) 0.4 MG SL tablet [id number]  Place 0.4 mg under the tongue every 5 (five) minutes as needed for chest pain. [provider]  Active   pantoprazole (PROTONIX) 40 MG tablet [id number]  Take 40 mg by mouth daily as needed (for reflux symptoms).  [provider]  Active Self           Med Note [name], [name]   Fri Oct 17, 2015  1:34 AM)    tiZANidine (ZANAFLEX) 4 MG tablet [id number]  Take 1 tablet (4 mg total) by mouth every 6 (six) hours as needed for muscle spasms. [name], [name]  Active             Patient Active  Problem List   Diagnosis Date Noted   Strain of knee 06/15/2019   History of trauma 03/21/2019   Insomnia 03/21/2019   Major depressive disorder, recurrent episode, severe with anxious distress ([place]) 03/21/2019   Panic attacks 03/21/2019   Amenorrhea 11/16/2018   Tobacco use disorder 05/27/2017   Hypertension    Diabetes mellitus without complication ([place])    Asthma    GERD (gastroesophageal reflux disease)    Hypertensive urgency    Syncope 07/23/2015   Obesity 04/26/2013   Hodgkin's disease in remission ([place]) 10/03/2012    Conditions to be addressed/monitored per PCP order:  Anxiety and Depression  Care Plan : General Social Work (Adult)  Updates made by Greg Cutter, LCSW since 01/20/2021 12:00 AM     Problem: Depression Identification (Depression)      Long-Range Goal: Depressive Symptoms Identified   Start Date: 01/20/2021  Note:   Timeframe:  Long-Range Goal Priority:  High Start Date:   01/20/21                Expected End Date:  ongoing                     Follow Up Date--01/28/21   - check out counseling - keep 13 percent of counseling appointments - schedule counseling appointment    Why is this important?             Beating depression may take some time.            If you don't feel better right away, don't give up on your treatment plan.    Current barriers:             Chronic Mental Health needs related to depression, stress and panic attacks           Mental Health Concerns            Needs Support, Education, and Care Coordination in order to meet unmet mental health needs. Clinical Goal(s): demonstrate a reduction in symptoms related to : Depression and panic attacks, connect with provider for ongoing mental health treatment.  , and increase coping skills, healthy habits, self-management skills, and stress reduction     Clinical Interventions:            Assessed patient's previous and current treatment, coping skills, support system and barriers  to care  ?         Depression screen reviewed  ?         Solution-Focused Strategies ?         Mindfulness or Relaxation Training ?         Active listening /  Reflection utilized  ?         Emotional Supportive Provided ?         Behavioral Activation ?         Participation in counseling encouraged  ?         Verbalization of feelings encouraged  ?         Crisis Resource Education / information provided  ?         Suicidal Ideation/Homicidal Ideation assessed: ?         Discussed Health Care Power of Attorney  ?         Discussed referral for counseling           Review various resources, discussed options and provided patient information about  ?         Options for mental health treatment based on need and insurance           Inter-disciplinary care team collaboration (see longitudinal plan of care)           LCSW discussed coping skills for anxiety and depression. SW used empathetic and active and reflective listening, validated feelings/concerns, and provided emotional support. LCSW provided self-care education to help manage her mental health conditions and improve her mood.            [facility] LCSW received the following referral from [facility] RNCM- Pt reports worsening depression/anxiety- does not feel her primary care providers are managing her [place] issues well, she needs a Biomedical scientist and psychiatrist that can better treat her anxiety, depression, panic attacks and agoraphobia. I educated her to the [facility] benefits her health plan offers ( i.e. [place] app). [facility] LCSW completed outreach call today on 01/20/21 and spoke to patient successfully. Patient has a history of trauma, panic attacks, agoraphobia and depression and is interested in mental health support. [facility] LCSW educated patient on available mental health services within her area. Patient is agreeable to referral to [facility] for medication management and counseling. Referral made today on 01/20/21. Patient denies any current SI/HI at this time.  Patient reports "sometimes I just feel it would be easier if I wasn't here but I have no intent to ever harm myself or anyone else." Patient was advised of crisis resources and [facility]'s walk in clinic and urgent care center.            Patient denies any current crises or urgent needs Patient Goals/Self-Care Activities: Over the next 120 days           Call your insurance provider for more information about your Enhanced Benefits      24- Hour Availability:    [facility]  [address] [place], [place] Crisis [phone]   [facility] [employer] [place]  [phone]    [place]  [phone] (after hours)   Therapeutic Alternative/Mobile Crisis   [phone]   [place] National Suicide Hotline  (765)372-1266 (TALK) OR 988   Call 911 or go to emergency room   [facility]  [phone]);  [place] and [employer]  [phone]); [place], [place], [place], [place], [place], [place], [place]         10 LITTLE Things To Do When Youre Feeling Too Down To Do Anything  Take a shower. Even if you plan to stay in all day long and not see a soul, take a shower. It takes  the most effort to hop in to the shower but once you do, youll feel immediate results. It will wake you up and youll be feeling much fresher (and cleaner too).  Brush and floss your teeth. Give your teeth a good brushing with a floss finish. Its a small task but it feels so good and you can check taking care of your health off the list of things to do.  Do something small on your list. Most of [place] have some small thing we would like to get done (load of laundry, sew a button, email a friend). Doing one of these things will make you feel like youve accomplished something.  Drink water. Drinking water is easy right? Its also really beneficial for your health  so keep a glass beside you all day and take sips often. It gives you energy and prevents you from boredom eating.  Do some floor exercises. The last thing you want to do is exercise but it might be just the thing you need the most. Keep it simple and do exercises that involve sitting or laying on the floor. Even the smallest of exercises release chemicals in the brain that make you feel good. Yoga stretches or core exercises are going to make you feel good with minimal effort.  Make your bed. Making your bed takes a few minutes but its productive and youll feel relieved when its done. An unmade bed is a huge visual reminder that youre having an unproductive day. Do it and consider it your housework for the day.  Put on some nice clothes. Take the sweatpants off even if you dont plan to go anywhere. Put on clothes that make you feel good. Take a look in the mirror so your brain recognizes the sweatpants have been replaced with clothes that make you look great. Its an instant confidence booster.  Wash the dishes. A pile of dirty dishes in the sink is a reflection of your mood. Its possible that if you wash up the dishes, your mood will follow suit. Its worth a try.  Cook a real meal. If you have the luxury to have a do nothing day, you have time to make a real meal for yourself. Make a meal that you love to eat. The process is good to get you out of the funk and the food will ensure you have more energy for tomorrow.  Write out your thoughts by hand. When you hand write, you stimulate your brain to focus on the moment that youre in so make yourself comfortable and write whatever comes into your mind. Put those thoughts out on paper so they stop spinning around in your head. Those thoughts might be the very thing holding you down.         Follow up:  Patient agrees to Care Plan and Follow-up.  Plan: The Managed Medicaid care management team will reach out to the patient again over  the next 30 days.  Date/time of next scheduled Social Work care management/care coordination outreach:  01/28/21 at 9:30 am  Eula Fried, BSW, MSW, [place] Medicaid LCSW [email or web address] Phone: [phone]

## 2021-01-20 NOTE — ED Triage Notes (Signed)
Patient c/o productive cough that has gotten worse over the past 4 weeks.  Phlegm is going from brown to white in color.  Unable to sleep from coughing.  Taken Mucinex DM, Nyquil/Dayquil, Sinus OTC meds.  Patient is not vaccinated for COVID.

## 2021-01-22 ENCOUNTER — Ambulatory Visit: Payer: Self-pay

## 2021-01-22 ENCOUNTER — Ambulatory Visit: Payer: Medicaid Other

## 2021-01-22 ENCOUNTER — Telehealth: Payer: Self-pay | Admitting: Pharmacist

## 2021-01-22 NOTE — Patient Outreach (Signed)
01/22/2021 Name: Bridget Scott MRN: [id number] DOB: 06/11/1980  Referred by: Augusto Gamble, DO Reason for referral : No chief complaint on file.   An unsuccessful telephone outreach was attempted today. The patient was referred to the case management team for assistance with care management and care coordination.    Follow Up Plan: The Managed Medicaid care management team will reach out to the patient again over the next 14 days.   Hughes Better PharmD, CPP High Risk Managed Medicaid [place] [phone]

## 2021-01-28 ENCOUNTER — Other Ambulatory Visit: Payer: Self-pay

## 2021-01-28 NOTE — Patient Outreach (Signed)
Medicaid Managed Care Social Work Note  01/28/2021 Name:  Bridget Scott MRN:  [id number] DOB:  07/24/1980  Bridget Scott is an 41 y.o. year old female who is a primary patient of Augusto Gamble, DO.  The Medicaid Managed Care Coordination team was consulted for assistance with:  Bridget Scott and Resources  Bridget Scott was given information about Medicaid Managed Care Coordination team services today. Bridget Scott Patient agreed to services and verbal consent obtained.  Engaged with patient  for by telephone forfollow up visit in response to referral for case management and/or care coordination services.   Assessments/Interventions:  Review of past medical history, allergies, medications, health status, including review of consultants reports, laboratory and other test data, was performed as part of comprehensive evaluation and provision of chronic care management services.  SDOH: (Social Determinant of Health) assessments and interventions performed: BSW completed follow up telephone call for LCSW. Patient stated she did not receive a telephone call from [facility] to schedule an appointment. BSW provided patient with the telephone number for [place] also followed up with patient about the email that was sent with rent and utility resources. Patient stated she did not receive the email, but she has also been sick. BSW will resend email.    Advanced Directives Status:  Not addressed in this encounter.  Care Plan                 Allergies  Allergen Reactions   Robitussin Dm Max Day-Night Hives and Itching    Other reaction(s): Dizziness, Flushing, Vomiting   Analgesic Balm [Trolamine (Triethanolamine)] Rash   Bengay Pain Relief [Menthol] Rash   Guaifenesin Hives, Rash, Other (See Comments) and Nausea And Vomiting    "Made me sick" (Robitussin)   Latex Rash    Medications Reviewed Today     Reviewed by Britta Mccreedy, CMA (Certified Medical  Assistant) on 01/20/21 at [place] List Status: <None>   Medication Order Taking? Sig Documenting Provider Last Dose Status Informant  albuterol (VENTOLIN HFA) 108 (90 Base) MCG/ACT inhaler [id number]  Inhale 1-2 puffs into the lungs every 6 (six) hours as needed for wheezing or shortness of breath. [name], [place] C, [name]  Active            Med Note [name]   Tue Jan 06, 2021  1:47 PM) Used it last night 01/06/21 due to coughing jag    Discontinued 04/13/20 1030   blood glucose meter kit and supplies KIT [id number]  Dispense based on patient and insurance preference. Use up to four times daily as directed. (FOR ICD-9 250.00, 250.01). [name], [name], [name]  Active   busPIRone (BUSPAR) 5 MG tablet [id number]  buspirone 5 mg tablet  TAKE 1 TABLET BY MOUTH THREE TIMES A DAY. [provider]  Active   Cetirizine HCl 10 MG CAPS [id number]  Take 1 capsule (10 mg total) by mouth daily for 10 days. [name], [name], [name]  Expired 12/25/20 2359   lisinopril-hydrochlorothiazide (PRINZIDE,ZESTORETIC) 20-25 MG tablet [id number]  Take 1 tablet by mouth daily. [name], [name], MD  Active Self           Med Note [name]   Tue Jan 06, 2021  1:52 PM) 3-4 times week   LORAZEPAM PO [id number]  Take by mouth.  Patient not taking: Reported on 01/06/2021   [provider]  Active   medroxyPROGESTERone (PROVERA) 10 MG tablet [id number]  Take 1 tablet (10 mg total) by mouth daily for 14 days. Use every month. [name], MD  Expired 12/25/20 2359   metFORMIN (GLUCOPHAGE) 500 MG tablet [id number]  Take 500 mg by mouth 3 (three) times daily. [provider]  Active Self           Med Note [name], Renne Musca Jan 06, 2021  2:56 PM) Takes it twice daily, feels 3 times daily is too much medication  nitroGLYCERIN (NITROSTAT) 0.4 MG SL tablet [id number]  Place 0.4 mg under the tongue every 5 (five) minutes as needed for chest pain. [provider]  Active    pantoprazole (PROTONIX) 40 MG tablet [id number]  Take 40 mg by mouth daily as needed (for reflux symptoms).  [provider]  Active Self           Med Note [name], [name]   Fri Oct 17, 2015  1:34 AM)    tiZANidine (ZANAFLEX) 4 MG tablet [id number]  Take 1 tablet (4 mg total) by mouth every 6 (six) hours as needed for muscle spasms. [name], [name]  Active             Patient Active Problem List   Diagnosis Date Noted   Strain of knee 06/15/2019   History of trauma 03/21/2019   Insomnia 03/21/2019   Major depressive disorder, recurrent episode, severe with anxious distress ([place]) 03/21/2019   Panic attacks 03/21/2019   Amenorrhea 11/16/2018   Tobacco use disorder 05/27/2017   Hypertension    Diabetes mellitus without complication (HCC)    Asthma    GERD (gastroesophageal reflux disease)    Hypertensive urgency    Syncope 07/23/2015   Obesity 04/26/2013   Hodgkin's disease in remission ([place]) 10/03/2012    Conditions to be addressed/monitored per PCP order:   mental health and community resources  There are no care plans that you recently modified to display for this patient.   Follow up:  Patient agrees to Care Plan and Follow-up.  Plan: The Managed Medicaid care management team will reach out to the patient again over the next 30-45 days.  Date/time of next scheduled Social Work care management/care coordination outreach:  03/02/20  Mickel Fuchs, Arita Miss, [place] Medicaid Team  [phone]

## 2021-01-28 NOTE — Patient Instructions (Addendum)
Visit Information  Bridget Scott  - as a part of your Medicaid benefit, you are eligible for care management and care coordination services at no cost or copay. I was unable to reach you by phone today but would be happy to help you with your health related needs. Please feel free to call me @ [phone].   A member of the Managed Medicaid care management team will reach out to you again over the next 30-45 days.   Mickel Fuchs, BSW, [place] Managed Medicaid Team  [phone] Visit Information  Bridget Scott was given information about Medicaid Managed Care team care coordination services as a part of their [place] Medicaid benefit. Bridget Scott verbally consented to engagement with the [facility] Managed Care team.   If you are experiencing a medical emergency, please call 911 or report to your local emergency department or urgent care.   If you have a non-emergency medical problem during routine business hours, please contact your provider's office and ask to speak with a nurse.   For questions related to your [facility], please call: [phone] or visit the homepage here: [email or web address]  If you would like to schedule transportation through your [facility], please call the following number at least 2 days in advance of your appointment: [phone].   Call the [place] at [phone], at any time, 24 hours a day, 7 days a week. If you are in danger or need immediate medical attention call 911.  If you would like help to quit smoking, call 1-800-QUIT-NOW (425)544-2348) OR Espaol: 1-855-Djelo-Ya (7-564-332-9518) o para ms informacin haga clic aqu or Text READY to 200-400 to register via text  Bridget Scott - following are the goals we discussed in your visit today:    Goals Addressed   None     Social Worker will follow up with patient in 30 days .   Mickel Fuchs, BSW, [place] Managed Medicaid Team  [phone]   Following is a copy of your plan of care:  There are no care plans that you recently modified to display for this patient.

## 2021-01-28 NOTE — Patient Outreach (Signed)
Care Coordination  01/28/2021  Bridget Scott [birth year] [id number]   Medicaid Managed Care   Unsuccessful Outreach Note  01/28/2021 Name: Bridget Scott MRN: [id number] DOB: 03-31-1980  Referred by: Augusto Gamble, DO Reason for referral : High Risk Managed Medicaid (MM Social Work Unsuccessful [employer])   An unsuccessful telephone outreach was attempted today. The patient was referred to the case management team for assistance with care management and care coordination.   Follow Up Plan: The care management team will reach out to the patient again over the next 30-45 days.   Marland Kitchenajs

## 2021-02-03 ENCOUNTER — Ambulatory Visit: Payer: Self-pay

## 2021-02-03 ENCOUNTER — Telehealth: Payer: Self-pay | Admitting: *Deleted

## 2021-02-03 NOTE — Patient Outreach (Signed)
Care Coordination  02/03/2021  TNIYA BOWDITCH 01-10-1981 [id number]  02/03/2021 Name: CATALEYA CRISTINA MRN: [id number] DOB: [birth year]  Referred by: Augusto Gamble, DO Reason for referral : High Risk Managed Medicaid (Unsuccessful RNCM follow up telephone encounter)   An unsuccessful telephone outreach was attempted today. The patient was referred to the case management team for assistance with care management and care coordination.    Follow Up Plan: A HIPAA compliant phone message was left for the patient providing contact information and requesting a return call. and The Managed Medicaid care management team will reach out to the patient again over the next 7 days.    Kelli Churn RN, CCM, [facility] Care Management Coordinator - Managed [place] High Risk [phone]

## 2021-02-03 NOTE — Patient Instructions (Signed)
[name] ,   The [facility] Managed Care Team is available to provide assistance to you with your healthcare needs at no cost and as a benefit of your [facility] plan. I'm sorry I was unable to reach you today for our scheduled appointment. Our care guide will call you to reschedule our telephone appointment. Please call me at the number below. I am available to be of assistance to you regarding your healthcare needs. .   Thank you,   Kelli Churn RN, CCM, [place] [facility] Care Management Coordinator - Managed [place] High Risk [phone]

## 2021-02-12 ENCOUNTER — Telehealth: Payer: Self-pay | Admitting: Family Medicine

## 2021-02-12 NOTE — Telephone Encounter (Signed)
.. °  Medicaid Managed Care   Unsuccessful Outreach Note  02/12/2021 Name: Bridget Scott MRN: [id number] DOB: 01/19/1981  Referred by: Augusto Gamble, DO Reason for referral : High Risk Managed Medicaid (I called the patient today to get her rescheduled with the MM RNCM. I left my name and number on her VM.)   An unsuccessful telephone outreach was attempted today. The patient was referred to the case management team for assistance with care management and care coordination.   Follow Up Plan: The care management team will reach out to the patient again over the next 7 days.   [place]

## 2021-02-13 ENCOUNTER — Ambulatory Visit: Payer: Medicaid Other

## 2021-02-18 ENCOUNTER — Telehealth: Payer: Self-pay | Admitting: Family Medicine

## 2021-02-18 NOTE — Telephone Encounter (Signed)
.. °  Medicaid Managed Care   Unsuccessful Outreach Note  02/18/2021 Name: Bridget Scott MRN: [id number] DOB: [birth year]  Referred by: Augusto Gamble, DO Reason for referral : High Risk Managed Medicaid (I called the patient today to get her phone visit with the MM RNCM rescheduled. I left my name and number on her VM.)   A second unsuccessful telephone outreach was attempted today. The patient was referred to the case management team for assistance with care management and care coordination.   Follow Up Plan: We have been unable to make contact with the patient for follow up. The care management team is available to follow up with the patient after provider conversation with the patient regarding recommendation for care management engagement and subsequent re-referral to the care management team.   [place], [place]

## 2021-02-23 ENCOUNTER — Telehealth: Payer: Self-pay | Admitting: *Deleted

## 2021-02-23 NOTE — Patient Outreach (Signed)
Care Coordination  02/23/2021  Bridget Scott [birth year] [id number]  02/23/2021 Name: Bridget Scott MRN: [id number] DOB: 07-14-1980  Referred by: Augusto Gamble, DO Reason for referral : Case Closure (Closing to HR MM program due to inability to maintain contact with patient)  Received message from scheduling care guide Reita Chard on 02/18/21 of inability to maintain contact with patient to reschedule follow up appointments.  Third unsuccessful telephone outreach was attempted today. The patient was referred to the case management team for assistance with care management and care coordination. The patient's primary care provider has been notified of our unsuccessful attempts to make or maintain contact with the patient. The care management team is pleased to engage with this patient at any time in the future should he/she be interested in assistance from the care management team.    Follow Up Plan: The Managed Medicaid care management team is available to follow up with the patient after provider conversation with the patient regarding recommendation for care management engagement and subsequent re-referral to the care management team.     Kelli Churn RN, CCM, [place] Management Coordinator - Managed [place] High Risk [phone]

## 2021-02-23 NOTE — Patient Instructions (Signed)
[name] ,   The [facility] Managed Care Team is available to provide assistance to you with your healthcare needs at no cost and as a benefit of your [facility] Health plan. We have been unable to reach you on 3 separate attempts. The care management team is available to assist with your healthcare needs at any time. Please do not hesitate to contact me at the number below. .   Thank you,   Kelli Churn RN, CCM, [facility] Care Management Coordinator - Managed [place] High Risk [phone]

## 2021-04-09 ENCOUNTER — Ambulatory Visit: Payer: Medicaid Other

## 2021-04-09 DIAGNOSIS — M5412 Radiculopathy, cervical region: Secondary | ICD-10-CM | POA: Diagnosis not present

## 2021-04-09 DIAGNOSIS — R202 Paresthesia of skin: Secondary | ICD-10-CM | POA: Diagnosis not present

## 2021-04-09 DIAGNOSIS — M25511 Pain in right shoulder: Secondary | ICD-10-CM | POA: Diagnosis not present

## 2021-04-20 DIAGNOSIS — F332 Major depressive disorder, recurrent severe without psychotic features: Secondary | ICD-10-CM | POA: Diagnosis not present

## 2021-04-20 DIAGNOSIS — F419 Anxiety disorder, unspecified: Secondary | ICD-10-CM | POA: Diagnosis not present

## 2021-04-20 DIAGNOSIS — F410 Panic disorder [episodic paroxysmal anxiety] without agoraphobia: Secondary | ICD-10-CM | POA: Diagnosis not present

## 2021-04-20 DIAGNOSIS — E119 Type 2 diabetes mellitus without complications: Secondary | ICD-10-CM | POA: Diagnosis not present

## 2021-04-21 DIAGNOSIS — M5412 Radiculopathy, cervical region: Secondary | ICD-10-CM | POA: Diagnosis not present

## 2021-05-06 DIAGNOSIS — M5412 Radiculopathy, cervical region: Secondary | ICD-10-CM | POA: Diagnosis not present

## 2021-05-07 DIAGNOSIS — I10 Essential (primary) hypertension: Secondary | ICD-10-CM | POA: Diagnosis not present

## 2021-05-07 DIAGNOSIS — R809 Proteinuria, unspecified: Secondary | ICD-10-CM | POA: Diagnosis not present

## 2021-05-07 DIAGNOSIS — E1129 Type 2 diabetes mellitus with other diabetic kidney complication: Secondary | ICD-10-CM | POA: Diagnosis not present

## 2021-05-07 DIAGNOSIS — M5090 Cervical disc disorder, unspecified, unspecified cervical region: Secondary | ICD-10-CM | POA: Diagnosis not present

## 2021-05-07 DIAGNOSIS — D751 Secondary polycythemia: Secondary | ICD-10-CM | POA: Diagnosis not present

## 2021-05-14 ENCOUNTER — Ambulatory Visit (HOSPITAL_COMMUNITY): Payer: Medicaid Other | Admitting: Clinical

## 2021-07-24 DIAGNOSIS — M5412 Radiculopathy, cervical region: Secondary | ICD-10-CM | POA: Diagnosis not present

## 2021-09-04 DIAGNOSIS — M5412 Radiculopathy, cervical region: Secondary | ICD-10-CM | POA: Diagnosis not present

## 2021-09-04 DIAGNOSIS — M25511 Pain in right shoulder: Secondary | ICD-10-CM | POA: Diagnosis not present

## 2021-09-04 DIAGNOSIS — R202 Paresthesia of skin: Secondary | ICD-10-CM | POA: Diagnosis not present

## 2021-10-29 ENCOUNTER — Ambulatory Visit
Admission: RE | Admit: 2021-10-29 | Discharge: 2021-10-29 | Disposition: A | Discharge status: Home / Self Care | Payer: Medicaid Other | Source: Ambulatory Visit | Attending: Urgent Care | Admitting: Urgent Care

## 2021-10-29 VITALS — BP 159/72 | HR 72 | Temp 98.8°F | Resp 16

## 2021-10-29 DIAGNOSIS — J4530 Mild persistent asthma, uncomplicated: Secondary | ICD-10-CM

## 2021-10-29 DIAGNOSIS — J019 Acute sinusitis, unspecified: Secondary | ICD-10-CM | POA: Diagnosis not present

## 2021-10-29 MED ORDER — PREDNISONE 50 MG PO TABS: 50.0000 mg | ORAL_TABLET | Freq: Every day | ORAL | 0 refills | Status: DC

## 2021-10-29 MED ORDER — ALBUTEROL SULFATE HFA 108 (90 BASE) MCG/ACT IN AERS
1.0000 | INHALATION_SPRAY | Freq: Four times a day (QID) | RESPIRATORY_TRACT | 0 refills | Status: AC | PRN
Start: 2021-10-29 — End: ?

## 2021-10-29 MED ORDER — PROMETHAZINE-DM 6.25-15 MG/5ML PO SYRP: 2.5000 mL | ORAL_SOLUTION | Freq: Three times a day (TID) | ORAL | 0 refills | Status: DC | PRN

## 2021-10-29 MED ORDER — AMOXICILLIN-POT CLAVULANATE 875-125 MG PO TABS: 1.0000 | ORAL_TABLET | Freq: Two times a day (BID) | ORAL | 0 refills | Status: DC

## 2021-10-29 NOTE — ED Triage Notes (Signed)
Pt states chest congestion, runny nose and cough for the past week.

## 2021-10-29 NOTE — ED Provider Notes (Signed)
[facility] - URGENT CARE CENTER  Note:  This document was prepared using Systems analyst and may include unintentional dictation errors.  MRN: [id number] DOB: 1980-06-22  Subjective:   Bridget Scott is a 41 y.o. female presenting for 8-9 day history of acute onset runny stuffy nose, chest congestion, productive cough, wheezing.  Has had 1 sick contact with her younger daughter who is also being seen for similar symptoms.  Patient would like a refill of her albuterol inhaler.  She did a COVID test at home and was negative.  Has a history of type 2 diabetes and is treated without insulin.  Patient is a smoker.  No current facility-administered medications for this encounter.  Current Outpatient Medications:    albuterol (VENTOLIN HFA) 108 (90 Base) MCG/ACT inhaler, Inhale 1-2 puffs into the lungs every 6 (six) hours as needed for wheezing or shortness of breath., Disp: 8 g, Rfl: 0   blood glucose meter kit and supplies KIT, Dispense based on patient and insurance preference. Use up to four times daily as directed. (FOR ICD-9 250.00, 250.01)., Disp: 1 each, Rfl: 0   busPIRone (BUSPAR) 5 MG tablet, buspirone 5 mg tablet  TAKE 1 TABLET BY MOUTH THREE TIMES A DAY., Disp: , Rfl:    Cetirizine HCl 10 MG CAPS, Take 1 capsule (10 mg total) by mouth daily for 10 days., Disp: 10 capsule, Rfl: 0   lisinopril-hydrochlorothiazide (PRINZIDE,ZESTORETIC) 20-25 MG tablet, Take 1 tablet by mouth daily., Disp: 30 tablet, Rfl: 0   LORAZEPAM PO, Take by mouth., Disp: , Rfl:    medroxyPROGESTERone (PROVERA) 10 MG tablet, Take 1 tablet (10 mg total) by mouth daily for 14 days. Use every month., Disp: 42 tablet, Rfl: 3   metFORMIN (GLUCOPHAGE) 500 MG tablet, Take 500 mg by mouth 3 (three) times daily., Disp: , Rfl:    nitroGLYCERIN (NITROSTAT) 0.4 MG SL tablet, Place 0.4 mg under the tongue every 5 (five) minutes as needed for chest pain., Disp: , Rfl:    pantoprazole (PROTONIX) 40 MG tablet,  Take 40 mg by mouth daily as needed (for reflux symptoms). , Disp: , Rfl:    promethazine-dextromethorphan (PROMETHAZINE-DM) 6.25-15 MG/5ML syrup, Take 5 mLs by mouth at bedtime as needed for cough., Disp: 30 mL, Rfl: 0   tiZANidine (ZANAFLEX) 4 MG tablet, Take 1 tablet (4 mg total) by mouth every 6 (six) hours as needed for muscle spasms., Disp: 30 tablet, Rfl: 0   Allergies  Allergen Reactions   Robitussin Dm Max Day-Night Hives and Itching    Other reaction(s): Dizziness, Flushing, Vomiting   Analgesic Balm [Trolamine (Triethanolamine)] Rash   Bengay Pain Relief [Menthol] Rash   Guaifenesin Hives, Rash, Other (See Comments) and Nausea And Vomiting    "Made me sick" (Robitussin)   Latex Rash    Past Medical History:  Diagnosis Date   Asthma    Cancer ([place])    lymphoma   Dental caries    periodontitis, maxillary cyst   Depression    Diabetes mellitus without complication (HCC)    GERD (gastroesophageal reflux disease)    Headache    migraines   Hodgkin lymphoma ([place])    in remission as of 2020. followed by [facility]   Hypertension    Hypertensive urgency    Pneumonia    as a baby   Seasonal allergies      Past Surgical History:  Procedure Laterality Date   CHOLECYSTECTOMY     LYMPH NODE BIOPSY  PORT-A-CATH REMOVAL     PORTACATH PLACEMENT     TOOTH EXTRACTION Bilateral 05/27/2017   Procedure: MULTIPLE EXTRACTIONS;  Surgeon: Diona Browner, DDS;  Location: [facility];  Service: Oral Surgery;  Laterality: Bilateral;    Family History  Problem Relation Age of Onset   Lung cancer Mother    Heart attack Father     Social History   Tobacco Use   Smoking status: Every Day    Packs/day: 0.50    Types: Cigarettes   Smokeless tobacco: Never  Vaping Use   Vaping Use: Never used  Substance Use Topics   Alcohol use: No   Drug use: No    ROS   Objective:   Vitals: There were no vitals taken for this visit.  Physical Exam Constitutional:      General: She is not in  acute distress.    Appearance: Normal appearance. She is well-developed and normal weight. She is not ill-appearing, toxic-appearing or diaphoretic.  HENT:     Head: Normocephalic and atraumatic.     Right Ear: Tympanic membrane, ear canal and external ear normal. No drainage or tenderness. No middle ear effusion. There is no impacted cerumen. Tympanic membrane is not erythematous.     Left Ear: Tympanic membrane, ear canal and external ear normal. No drainage or tenderness.  No middle ear effusion. There is no impacted cerumen. Tympanic membrane is not erythematous.     Nose: Congestion and rhinorrhea present.     Mouth/Throat:     Mouth: Mucous membranes are moist. No oral lesions.     Pharynx: No pharyngeal swelling, oropharyngeal exudate, posterior oropharyngeal erythema or uvula swelling.     Tonsils: No tonsillar exudate or tonsillar abscesses.  Eyes:     General: No scleral icterus.       Right eye: No discharge.        Left eye: No discharge.     Extraocular Movements: Extraocular movements intact.     Right eye: Normal extraocular motion.     Left eye: Normal extraocular motion.     Conjunctiva/sclera: Conjunctivae normal.  Cardiovascular:     Rate and Rhythm: Normal rate and regular rhythm.     Heart sounds: Normal heart sounds. No murmur heard.    No friction rub. No gallop.  Pulmonary:     Effort: Pulmonary effort is normal. No respiratory distress.     Breath sounds: No stridor. Wheezing and rhonchi present. No rales.  Chest:     Chest wall: No tenderness.  Musculoskeletal:     Cervical back: Normal range of motion and neck supple.  Lymphadenopathy:     Cervical: No cervical adenopathy.  Skin:    General: Skin is warm and dry.  Neurological:     General: No focal deficit present.     Mental Status: She is alert and oriented to person, place, and time.  Psychiatric:        Mood and Affect: Mood normal.        Behavior: Behavior normal.       Assessment and  Plan :   PDMP not reviewed this encounter.  1. Acute non-recurrent sinusitis, unspecified location   2. Mild persistent asthma without complication     Deferred imaging given clear cardiopulmonary exam, hemodynamically stable vital signs.  Suspect patient has sinusitis possible bronchitis, sinobronchitis.  As such we will manage with Augmentin, prednisone.  Refilled her albuterol inhaler.  Use supportive care otherwise. Counseled patient on potential for adverse effects  with medications prescribed/recommended today, ER and return-to-clinic precautions discussed, patient verbalized understanding.    Jaynee Eagles, [name] 10/29/21 1739

## 2021-11-10 ENCOUNTER — Emergency Department (HOSPITAL_COMMUNITY): Payer: Medicaid Other

## 2021-11-10 ENCOUNTER — Emergency Department (HOSPITAL_COMMUNITY)
Admission: EM | Admit: 2021-11-10 | Discharge: 2021-11-10 | Discharge status: Against Medical Advice | Payer: Medicaid Other | Attending: Emergency Medicine | Admitting: Emergency Medicine

## 2021-11-10 ENCOUNTER — Other Ambulatory Visit: Payer: Self-pay

## 2021-11-10 DIAGNOSIS — R059 Cough, unspecified: Secondary | ICD-10-CM | POA: Insufficient documentation

## 2021-11-10 DIAGNOSIS — Z20822 Contact with and (suspected) exposure to covid-19: Secondary | ICD-10-CM | POA: Insufficient documentation

## 2021-11-10 DIAGNOSIS — R0602 Shortness of breath: Secondary | ICD-10-CM | POA: Insufficient documentation

## 2021-11-10 DIAGNOSIS — R0981 Nasal congestion: Secondary | ICD-10-CM | POA: Diagnosis not present

## 2021-11-10 DIAGNOSIS — Z5321 Procedure and treatment not carried out due to patient leaving prior to being seen by health care provider: Secondary | ICD-10-CM | POA: Diagnosis not present

## 2021-11-10 DIAGNOSIS — I10 Essential (primary) hypertension: Secondary | ICD-10-CM | POA: Diagnosis not present

## 2021-11-10 DIAGNOSIS — R739 Hyperglycemia, unspecified: Secondary | ICD-10-CM | POA: Diagnosis not present

## 2021-11-10 LAB — BASIC METABOLIC PANEL
Anion gap: 13 (ref 5–15)
BUN: 7 mg/dL (ref 6–20)
CO2: 14 mmol/L — ABNORMAL LOW (ref 22–32)
Calcium: 8.2 mg/dL — ABNORMAL LOW (ref 8.9–10.3)
Chloride: 109 mmol/L (ref 98–111)
Creatinine, Ser: 0.35 mg/dL — ABNORMAL LOW (ref 0.44–1.00)
GFR, Estimated: >60 mL/min (ref >60)
Glucose, Bld: 254 mg/dL — ABNORMAL HIGH (ref 70–99)
Potassium: 3.6 mmol/L (ref 3.5–5.1)
Sodium: 136 mmol/L (ref 135–145)

## 2021-11-10 LAB — RESP PANEL BY RT-PCR (FLU A&B, COVID) ARPGX2
Influenza A by PCR: NEGATIVE
Influenza B by PCR: NEGATIVE
SARS Coronavirus 2 by RT PCR: NEGATIVE

## 2021-11-10 NOTE — ED Triage Notes (Signed)
Pt here from home for shob that has been getting worse since being diagnosed w/ URI 21 days ago, was on 10 days of antibiotics w/ no relief. Pt reports productive cough, not feeling well. Per Ems pt had wheezing in all lobes. Ems gave '5mg'$  albuterol, 0.'5mg'$  atrovent, '125mg'$  solumedrol. 20g LAC. 200/110, 70HR, 16RR, cbg 308

## 2021-11-10 NOTE — ED Notes (Signed)
Patient upset over wait time. States she has an appointment [facility] that she will follow up with. IV removed by this NT. Patient left.

## 2021-11-10 NOTE — ED Provider Triage Note (Signed)
Emergency Medicine Provider Triage Evaluation Note  Bridget Scott , a 41 y.o. female  was evaluated in triage.  Pt complains of SOB, productive cough, and congestion for about 3 weeks. Was on 10 days worth of antibiotics, finished 5 days ago. Also finished course of steroids. Having centralized chest pain with coughing. Had wheezing with EMS, given albuterol, atrovent, and solumedrol  Review of Systems  Positive: CP, SOB, cough, congestion Negative: Abd pain, N/V/D, fever  Physical Exam  Pulse (!) 14   Temp (!) 97.5 F (36.4 C) (Oral)   Resp 20   LMP 07/31/2021 (Approximate)   SpO2 98%  Gen:   Awake, no distress   Resp:  Normal effort  MSK:   Moves extremities without difficulty  Other:    Medical Decision Making  Medically screening exam initiated at 4:05 PM.  Appropriate orders placed.  [name] was informed that the remainder of the evaluation will be completed by another provider, this initial triage assessment does not replace that evaluation, and the importance of remaining in the ED until their evaluation is complete.  Workup initiated   Raylen Ken T, [name] 11/10/21 1607

## 2021-11-11 DIAGNOSIS — J42 Unspecified chronic bronchitis: Secondary | ICD-10-CM | POA: Insufficient documentation

## 2021-11-11 DIAGNOSIS — R0602 Shortness of breath: Secondary | ICD-10-CM | POA: Diagnosis not present

## 2021-11-11 DIAGNOSIS — J45909 Unspecified asthma, uncomplicated: Secondary | ICD-10-CM | POA: Diagnosis not present

## 2021-11-12 DIAGNOSIS — J45909 Unspecified asthma, uncomplicated: Secondary | ICD-10-CM | POA: Diagnosis not present

## 2021-11-12 DIAGNOSIS — F332 Major depressive disorder, recurrent severe without psychotic features: Secondary | ICD-10-CM | POA: Diagnosis not present

## 2021-11-12 DIAGNOSIS — R0602 Shortness of breath: Secondary | ICD-10-CM | POA: Diagnosis not present

## 2021-11-12 DIAGNOSIS — J42 Unspecified chronic bronchitis: Secondary | ICD-10-CM | POA: Diagnosis not present

## 2021-11-15 DIAGNOSIS — J45909 Unspecified asthma, uncomplicated: Secondary | ICD-10-CM | POA: Diagnosis not present

## 2021-11-15 DIAGNOSIS — J42 Unspecified chronic bronchitis: Secondary | ICD-10-CM | POA: Diagnosis not present

## 2021-11-15 DIAGNOSIS — R0602 Shortness of breath: Secondary | ICD-10-CM | POA: Diagnosis not present

## 2021-11-16 DIAGNOSIS — R809 Proteinuria, unspecified: Secondary | ICD-10-CM | POA: Diagnosis not present

## 2021-11-16 DIAGNOSIS — E1129 Type 2 diabetes mellitus with other diabetic kidney complication: Secondary | ICD-10-CM | POA: Diagnosis not present

## 2021-11-16 DIAGNOSIS — J441 Chronic obstructive pulmonary disease with (acute) exacerbation: Secondary | ICD-10-CM | POA: Diagnosis not present

## 2021-11-16 DIAGNOSIS — Z0000 Encounter for general adult medical examination without abnormal findings: Secondary | ICD-10-CM | POA: Diagnosis not present

## 2021-12-15 ENCOUNTER — Encounter: Payer: Self-pay | Admitting: Obstetrics & Gynecology

## 2021-12-15 ENCOUNTER — Telehealth: Payer: Self-pay

## 2021-12-15 ENCOUNTER — Ambulatory Visit (INDEPENDENT_AMBULATORY_CARE_PROVIDER_SITE_OTHER): Payer: Medicaid Other | Admitting: Obstetrics & Gynecology

## 2021-12-15 VITALS — BP 176/91 | HR 90 | Wt 195.0 lb

## 2021-12-15 DIAGNOSIS — C8190 Hodgkin lymphoma, unspecified, unspecified site: Secondary | ICD-10-CM

## 2021-12-15 DIAGNOSIS — O09529 Supervision of elderly multigravida, unspecified trimester: Secondary | ICD-10-CM | POA: Insufficient documentation

## 2021-12-15 DIAGNOSIS — F332 Major depressive disorder, recurrent severe without psychotic features: Secondary | ICD-10-CM

## 2021-12-15 DIAGNOSIS — O0990 Supervision of high risk pregnancy, unspecified, unspecified trimester: Secondary | ICD-10-CM | POA: Insufficient documentation

## 2021-12-15 DIAGNOSIS — E119 Type 2 diabetes mellitus without complications: Secondary | ICD-10-CM | POA: Diagnosis not present

## 2021-12-15 DIAGNOSIS — E669 Obesity, unspecified: Secondary | ICD-10-CM | POA: Diagnosis not present

## 2021-12-15 DIAGNOSIS — Z6832 Body mass index (BMI) 32.0-32.9, adult: Secondary | ICD-10-CM | POA: Diagnosis not present

## 2021-12-15 DIAGNOSIS — Z8759 Personal history of other complications of pregnancy, childbirth and the puerperium: Secondary | ICD-10-CM | POA: Diagnosis not present

## 2021-12-15 DIAGNOSIS — Z3201 Encounter for pregnancy test, result positive: Secondary | ICD-10-CM

## 2021-12-15 DIAGNOSIS — O0992 Supervision of high risk pregnancy, unspecified, second trimester: Secondary | ICD-10-CM

## 2021-12-15 DIAGNOSIS — O09522 Supervision of elderly multigravida, second trimester: Secondary | ICD-10-CM

## 2021-12-15 DIAGNOSIS — I10 Essential (primary) hypertension: Secondary | ICD-10-CM | POA: Diagnosis not present

## 2021-12-15 DIAGNOSIS — Z3A15 15 weeks gestation of pregnancy: Secondary | ICD-10-CM

## 2021-12-15 LAB — POCT URINE PREGNANCY: Preg Test, Ur: POSITIVE — AB

## 2021-12-15 MED ORDER — FOLIC ACID 1 MG PO TABS: 1.0000 mg | ORAL_TABLET | Freq: Every day | ORAL | 10 refills | Status: DC

## 2021-12-15 MED ORDER — PRENATAL 28-0.8 MG PO TABS: 1.0000 | ORAL_TABLET | Freq: Every day | ORAL | 12 refills | Status: AC

## 2021-12-15 MED ORDER — NIFEDIPINE ER OSMOTIC RELEASE 30 MG PO TB24: 30.0000 mg | ORAL_TABLET | Freq: Every day | ORAL | 2 refills | Status: DC

## 2021-12-15 MED ORDER — METFORMIN HCL 1000 MG PO TABS: 1000.0000 mg | ORAL_TABLET | Freq: Two times a day (BID) | ORAL | 2 refills | Status: AC

## 2021-12-15 MED ORDER — ASPIRIN 81 MG PO TBEC: 81.0000 mg | DELAYED_RELEASE_TABLET | Freq: Every day | ORAL | 2 refills | Status: AC

## 2021-12-15 NOTE — Progress Notes (Signed)
Bridget Scott here for a UPT. Pt had a positive upt at home. LMP is 08/28/21.    Hx of irregular periods  UPT in office Positive.   Pt stopped B/P Rx. Pregnancy not planned.  Pt on Ozempic.  FHT's heard : 155.

## 2021-12-15 NOTE — Telephone Encounter (Signed)
Called patient, no answer, left voicemail with surgery date, time, location, preop instructions and call back number. 

## 2021-12-15 NOTE — Progress Notes (Signed)
OFFICE VISIT NOTE  History:   Bridget Scott is a 41 y.o. [phone] here today for urine pregnancy test and pregnancy confirmation.   LMP 08/28/2021, had positive UPT at home.  LMP dating makes her [redacted]w[redacted]d  History of 20 week PPROM and loss in 2001, also early SAB in 2015.  History of DM, HTN, and Hodgkin lymphoma ([place] in remission as of 2020. Accompanied by her husband.  She denies any abnormal vaginal discharge, bleeding, pelvic pain or other concerns.    Past Medical History:  Diagnosis Date   Asthma    [place]    lymphoma   Dental caries    periodontitis, maxillary cyst   Depression    Diabetes mellitus without complication (HCC)    GERD (gastroesophageal reflux disease)    Headache    migraines   [place]kin [place]    [facility] 2020[facility]  Hypertension    Hypertensive urgency    Insomnia 03/21/2019   Pneumonia    as a baby   Seasonal allergies     Past Surgical History:  Procedure Laterality Date   CHOLECYSTECTOMY     LYMPH NODE BIOPSY     PORT-A-CATH REMOVAL     PORTACATH PLACEMENT     TOOTH EXTRACTION Bilateral 05/27/2017   Procedure: MULTIPLE EXTRACTIONS;  Surgeon: JDiona Browner DDS;  [place] [facility]  Service: Oral Surgery;  Laterality: Bilateral;    The following portions of the patient's history were reviewed and updated as appropriate: allergies, current medications, past family history, past medical history, past social history, past surgical history and problem list.   Health Maintenance:  [facility] Patient says she may have other following paps, unsure.  Review of Systems:  Pertinent items noted in HPI and remainder of comprehensive ROS otherwise negative.  Physical Exam:  BP (!) 176/91   Pulse 90   Wt 195 lb (88.5 kg)   LMP 08/28/2021 (Approximate)   BMI 32.45 kg/m  CONSTITUTIONAL: Well-developed, well-nourished female in no acute distress.  SKIN: No rash  noted. Not diaphoretic. No erythema. No pallor. MUSCULOSKELETAL: Normal range of motion. No edema noted. NEUROLOGIC: Alert and oriented to person, place, and time. Normal muscle tone coordination. No cranial nerve deficit noted. PSYCHIATRIC: Normal mood and affect. Normal behavior. Normal judgment and thought content. CARDIOVASCULAR: Normal heart rate noted RESPIRATORY: Effort and breath sounds normal, no problems with respiration noted ABDOMEN: No masses noted. No other overt distention noted.   PELVIC: Deferred  Labs and Imaging Results for orders placed or performed in visit on 12/15/21 (from the past 168 hour(s))  POCT urine pregnancy   Collection Time: 12/15/21 11:29 AM  Result Value Ref Range   Preg Test, Ur Positive (A) Negative   No results found.    Assessment and Plan:     Patient's history and medication list were reviewed.  Discontinued medications that are not recommended in pregnancy such as Ozempic, lisinopril/HCTZ and changed other medications as listed below.  1. Diabetes mellitus without complication ([place] Discontinued Ozempic, increased Metformin to 1000 mg po bid for now. Referral made to DM coordinator for education and supplies. Fetal ECHO ordered. Last A1C 11/16/2021 was 8.5. - metFORMIN (GLUCOPHAGE) 1000 MG tablet; Take 1 tablet (1,000 mg total) by mouth 2 (two) times daily with a meal.  Dispense: 60 tablet; Refill: 2 - AMB referral to maternal fetal medicine - [place] OB DET[place] +14 WK; Future -  AMB Referral to Cardio Obstetrics - US Fetal Echocardiography; Future - Amb Referral to Nutrition and Diabetic Education  2. Primary hypertension Elevated today. Procardia XL 30 mg initiated.  Also ASA 81 mg initiated.  Will do BP check next week and adjust regimen accordingly.  Referred to Cardio-OB. - NIFEdipine (PROCARDIA-XL/NIFEDICAL-XL) 30 MG 24 hr tablet; Take 1 tablet (30 mg total) by mouth daily.  Dispense: 30 tablet; Refill: 2 - aspirin EC 81 MG tablet;  Take 1 tablet (81 mg total) by mouth daily. Start taking when you are [redacted] weeks pregnant for rest of pregnancy for prevention of preeclampsia  Dispense: 300 tablet; Refill: 2 - [place] MFM OB DETAIL +14 WK; Future - [place] MFM OB Transvaginal; Future - AMB Referral to [place]  3. History of preterm premature rupture of membranes (PPROM) at 20 weeks Scheduled for prophylactic cerclage next week given history. MFM consult requested.  Transvaginal cervical length ultrasound ordered. - AMB referral to maternal fetal medicine - [place] MFM OB DETAIL +14 WK; Future - [place] MFM OB Transvaginal; Future - CBC; Standing  4. Hodgkin's disease in remission [facility]) Will follow up MFM recommendations - AMB Referral to [place] - AMB referral to maternal fetal medicine  5. Class 1 obesity with serious comorbidity and body mass index (BMI) of 32.0 to 32.9 in adult, unspecified obesity type - Amb Referral to Nutrition and Diabetic Education - AMB referral to maternal fetal medicine  6. Major depressive disorder, recurrent episode, severe with anxious distress ([place]) Will monitor closely  7. Multigravida of advanced maternal age in second trimester Will do antenatal testing and ultrasounds as per MFM and follow MFM recommendations.   8. [redacted] weeks gestation of pregnancy - POCT urine pregnancy  9. Supervision of high risk pregnancy in second trimester - folic acid (FOLVITE) 1 MG tablet; Take 1 tablet (1 mg total) by mouth daily.  Dispense: 30 tablet; Refill: 10 - Prenatal 28-0.8 MG TABS; Take 1 tablet by mouth daily.  Dispense: 30 tablet; Refill: 12 - AMB referral to maternal fetal medicine - [place] MFM OB DETAIL +14 WK; Future - [place] MFM OB Transvaginal; Future - AMB Referral to Cardio Obstetrics  Routine preventative health maintenance measures emphasized. Please refer to After Visit Summary for other counseling recommendations.   Return in about 1 week (around 12/22/2021) for NOB VISIT.    I spent   45  minutes dedicated to the care of this patient including pre-visit review of records, face to face time with the patient discussing her conditions and treatments and post visit orders.    Verita Schneiders, MD, [place] for [employer], [place]

## 2021-12-16 ENCOUNTER — Telehealth (HOSPITAL_COMMUNITY): Payer: Self-pay | Admitting: *Deleted

## 2021-12-16 ENCOUNTER — Encounter (HOSPITAL_COMMUNITY): Payer: Self-pay | Admitting: *Deleted

## 2021-12-16 NOTE — Telephone Encounter (Signed)
Preadmission screenPreadmission screen  Arrive at 1030 NPO take blood pressure medication as prescribed.

## 2021-12-21 ENCOUNTER — Other Ambulatory Visit (HOSPITAL_COMMUNITY): Payer: Self-pay

## 2021-12-21 ENCOUNTER — Encounter (HOSPITAL_COMMUNITY)
Admission: RE | Disposition: A | Discharge status: Home / Self Care | Payer: Self-pay | Source: Ambulatory Visit | Attending: Obstetrics and Gynecology

## 2021-12-21 ENCOUNTER — Encounter (HOSPITAL_COMMUNITY): Payer: Self-pay | Admitting: Obstetrics and Gynecology

## 2021-12-21 ENCOUNTER — Ambulatory Visit (HOSPITAL_BASED_OUTPATIENT_CLINIC_OR_DEPARTMENT_OTHER): Payer: Medicaid Other | Admitting: Anesthesiology

## 2021-12-21 ENCOUNTER — Other Ambulatory Visit: Payer: Self-pay

## 2021-12-21 ENCOUNTER — Ambulatory Visit (HOSPITAL_COMMUNITY): Payer: Medicaid Other | Admitting: Anesthesiology

## 2021-12-21 ENCOUNTER — Ambulatory Visit (HOSPITAL_COMMUNITY)
Admission: RE | Admit: 2021-12-21 | Discharge: 2021-12-21 | Disposition: A | Discharge status: Home / Self Care | Payer: Medicaid Other | Source: Ambulatory Visit | Attending: Obstetrics and Gynecology | Admitting: Obstetrics and Gynecology

## 2021-12-21 DIAGNOSIS — K219 Gastro-esophageal reflux disease without esophagitis: Secondary | ICD-10-CM | POA: Diagnosis not present

## 2021-12-21 DIAGNOSIS — O99342 Other mental disorders complicating pregnancy, second trimester: Secondary | ICD-10-CM | POA: Diagnosis not present

## 2021-12-21 DIAGNOSIS — F32A Depression, unspecified: Secondary | ICD-10-CM | POA: Insufficient documentation

## 2021-12-21 DIAGNOSIS — O09292 Supervision of pregnancy with other poor reproductive or obstetric history, second trimester: Secondary | ICD-10-CM | POA: Diagnosis not present

## 2021-12-21 DIAGNOSIS — O99612 Diseases of the digestive system complicating pregnancy, second trimester: Secondary | ICD-10-CM | POA: Insufficient documentation

## 2021-12-21 DIAGNOSIS — F17210 Nicotine dependence, cigarettes, uncomplicated: Secondary | ICD-10-CM | POA: Diagnosis not present

## 2021-12-21 DIAGNOSIS — O3432 Maternal care for cervical incompetence, second trimester: Secondary | ICD-10-CM | POA: Insufficient documentation

## 2021-12-21 DIAGNOSIS — O99332 Smoking (tobacco) complicating pregnancy, second trimester: Secondary | ICD-10-CM | POA: Insufficient documentation

## 2021-12-21 DIAGNOSIS — O24912 Unspecified diabetes mellitus in pregnancy, second trimester: Secondary | ICD-10-CM | POA: Diagnosis not present

## 2021-12-21 DIAGNOSIS — O10912 Unspecified pre-existing hypertension complicating pregnancy, second trimester: Secondary | ICD-10-CM | POA: Insufficient documentation

## 2021-12-21 DIAGNOSIS — Z7951 Long term (current) use of inhaled steroids: Secondary | ICD-10-CM | POA: Diagnosis not present

## 2021-12-21 DIAGNOSIS — O24112 Pre-existing diabetes mellitus, type 2, in pregnancy, second trimester: Secondary | ICD-10-CM | POA: Diagnosis not present

## 2021-12-21 DIAGNOSIS — C8190 Hodgkin lymphoma, unspecified, unspecified site: Secondary | ICD-10-CM | POA: Diagnosis not present

## 2021-12-21 DIAGNOSIS — Z79899 Other long term (current) drug therapy: Secondary | ICD-10-CM | POA: Insufficient documentation

## 2021-12-21 DIAGNOSIS — Z8759 Personal history of other complications of pregnancy, childbirth and the puerperium: Secondary | ICD-10-CM

## 2021-12-21 DIAGNOSIS — Z3A16 16 weeks gestation of pregnancy: Secondary | ICD-10-CM | POA: Diagnosis not present

## 2021-12-21 DIAGNOSIS — O99512 Diseases of the respiratory system complicating pregnancy, second trimester: Secondary | ICD-10-CM | POA: Insufficient documentation

## 2021-12-21 DIAGNOSIS — O99352 Diseases of the nervous system complicating pregnancy, second trimester: Secondary | ICD-10-CM | POA: Insufficient documentation

## 2021-12-21 DIAGNOSIS — O09522 Supervision of elderly multigravida, second trimester: Secondary | ICD-10-CM | POA: Insufficient documentation

## 2021-12-21 DIAGNOSIS — F419 Anxiety disorder, unspecified: Secondary | ICD-10-CM | POA: Insufficient documentation

## 2021-12-21 DIAGNOSIS — Z7984 Long term (current) use of oral hypoglycemic drugs: Secondary | ICD-10-CM | POA: Insufficient documentation

## 2021-12-21 DIAGNOSIS — O99212 Obesity complicating pregnancy, second trimester: Secondary | ICD-10-CM | POA: Insufficient documentation

## 2021-12-21 DIAGNOSIS — J45909 Unspecified asthma, uncomplicated: Secondary | ICD-10-CM

## 2021-12-21 DIAGNOSIS — O162 Unspecified maternal hypertension, second trimester: Secondary | ICD-10-CM

## 2021-12-21 HISTORY — PX: CERVICAL CERCLAGE: SHX1329

## 2021-12-21 LAB — CBC
HCT: 39.7 % (ref 36.0–46.0)
Hemoglobin: 14.2 g/dL (ref 12.0–15.0)
MCH: 32.1 pg (ref 26.0–34.0)
MCHC: 35.8 g/dL (ref 30.0–36.0)
MCV: 89.6 fL (ref 80.0–100.0)
Platelets: 168 10*3/uL (ref 150–400)
RBC: 4.43 MIL/uL (ref 3.87–5.11)
RDW: 12.8 % (ref 11.5–15.5)
WBC: 12.7 10*3/uL — ABNORMAL HIGH (ref 4.0–10.5)
nRBC: 0 % (ref 0.0–0.2)

## 2021-12-21 LAB — GLUCOSE, CAPILLARY
Glucose-Capillary: 222 mg/dL — ABNORMAL HIGH (ref 70–99)
Glucose-Capillary: 178 mg/dL — ABNORMAL HIGH (ref 70–99)
Glucose-Capillary: 159 mg/dL — ABNORMAL HIGH (ref 70–99)

## 2021-12-21 SURGERY — CERCLAGE, CERVIX, VAGINAL APPROACH
Anesthesia: Spinal

## 2021-12-21 MED ORDER — OXYCODONE-ACETAMINOPHEN 5-325 MG PO TABS
1.0000 | ORAL_TABLET | Freq: Four times a day (QID) | ORAL | 0 refills | Status: DC | PRN
  Filled 2021-12-21: qty 4, 1d supply, fill #0

## 2021-12-21 MED ORDER — INDOMETHACIN 25 MG PO CAPS
50.0000 mg | ORAL_CAPSULE | Freq: Three times a day (TID) | ORAL | 0 refills | Status: DC
  Filled 2021-12-21: qty 4, 1d supply, fill #0

## 2021-12-21 MED ORDER — OXYCODONE HCL 5 MG/5ML PO SOLN: 5.0000 mg | Freq: Once | ORAL | Status: DC | PRN

## 2021-12-21 MED ORDER — KETOROLAC TROMETHAMINE 30 MG/ML IJ SOLN
30.0000 mg | Freq: Once | INTRAMUSCULAR | Status: AC | PRN
  Administered 2021-12-21: 30 mg via INTRAVENOUS

## 2021-12-21 MED ORDER — ACETAMINOPHEN 500 MG PO TABS
ORAL_TABLET | ORAL | Status: AC
  Filled 2021-12-21: qty 2

## 2021-12-21 MED ORDER — ONDANSETRON HCL 4 MG/2ML IJ SOLN
INTRAMUSCULAR | Status: AC
  Filled 2021-12-21: qty 2

## 2021-12-21 MED ORDER — FENTANYL CITRATE (PF) 100 MCG/2ML IJ SOLN
INTRAMUSCULAR | Status: DC | PRN
  Administered 2021-12-21: 15 ug via INTRATHECAL

## 2021-12-21 MED ORDER — OXYCODONE HCL 5 MG PO TABS: 5.0000 mg | ORAL_TABLET | Freq: Once | ORAL | Status: DC | PRN

## 2021-12-21 MED ORDER — HYDROMORPHONE HCL 1 MG/ML IJ SOLN: 0.2500 mg | INTRAMUSCULAR | Status: DC | PRN

## 2021-12-21 MED ORDER — CEFAZOLIN SODIUM-DEXTROSE 2-4 GM/100ML-% IV SOLN
2.0000 g | INTRAVENOUS | Status: AC
  Administered 2021-12-21: 2 g via INTRAVENOUS

## 2021-12-21 MED ORDER — CHLOROPROCAINE HCL (PF) 3 % IJ SOLN
INTRAMUSCULAR | Status: DC | PRN
  Administered 2021-12-21: 1.6 mL

## 2021-12-21 MED ORDER — CEFAZOLIN SODIUM-DEXTROSE 2-4 GM/100ML-% IV SOLN
INTRAVENOUS | Status: AC
  Filled 2021-12-21: qty 100

## 2021-12-21 MED ORDER — FENTANYL CITRATE (PF) 100 MCG/2ML IJ SOLN
INTRAMUSCULAR | Status: AC
  Filled 2021-12-21: qty 2

## 2021-12-21 MED ORDER — ONDANSETRON HCL 4 MG/2ML IJ SOLN
INTRAMUSCULAR | Status: DC | PRN
  Administered 2021-12-21: 4 mg via INTRAVENOUS

## 2021-12-21 MED ORDER — CHLOROPROCAINE HCL (PF) 3 % IJ SOLN
INTRAMUSCULAR | Status: AC
  Filled 2021-12-21: qty 20

## 2021-12-21 MED ORDER — INSULIN ASPART 100 UNIT/ML IJ SOLN
0.0000 [IU] | Freq: Three times a day (TID) | INTRAMUSCULAR | Status: DC
  Administered 2021-12-21: 2 [IU] via SUBCUTANEOUS
  Administered 2021-12-21: 7 [IU] via SUBCUTANEOUS

## 2021-12-21 MED ORDER — PROMETHAZINE HCL 25 MG/ML IJ SOLN: 6.2500 mg | INTRAMUSCULAR | Status: DC | PRN

## 2021-12-21 MED ORDER — LACTATED RINGERS IV SOLN: INTRAVENOUS | Status: DC

## 2021-12-21 MED ORDER — KETOROLAC TROMETHAMINE 30 MG/ML IJ SOLN
INTRAMUSCULAR | Status: AC
  Filled 2021-12-21: qty 1

## 2021-12-21 MED ORDER — INDOMETHACIN 50 MG RE SUPP
100.0000 mg | Freq: Once | RECTAL | Status: AC
  Administered 2021-12-21: 100 mg via RECTAL
  Filled 2021-12-21: qty 2

## 2021-12-21 MED ORDER — ACETAMINOPHEN 500 MG PO TABS
1000.0000 mg | ORAL_TABLET | ORAL | Status: AC
  Administered 2021-12-21: 1000 mg via ORAL

## 2021-12-21 SURGICAL SUPPLY — 18 items
CANISTER SUCT 3000ML PPV (MISCELLANEOUS) ×2 IMPLANT
GLOVE SURG ORTHO LTX SZ8 (GLOVE) ×2 IMPLANT
GLOVE SURG POLYISO LF SZ8 (GLOVE) ×2 IMPLANT
GOWN STRL REUS W/TWL LRG LVL3 (GOWN DISPOSABLE) ×6 IMPLANT
NDL MAYO CATGUT SZ4 TPR NDL (NEEDLE) ×2 IMPLANT
NEEDLE MAYO CATGUT SZ4 (NEEDLE) ×1 IMPLANT
PACK VAGINAL MINOR WOMEN LF (CUSTOM PROCEDURE TRAY) ×2 IMPLANT
PAD OB MATERNITY 4.3X12.25 (PERSONAL CARE ITEMS) ×2 IMPLANT
PAD PREP 24X48 CUFFED NSTRL (MISCELLANEOUS) ×2 IMPLANT
SUT MERSILENE 5MM BP 1 12 (SUTURE) ×2 IMPLANT
SUT PROLENE 1 CTX 30  8455H (SUTURE) ×1
SUT PROLENE 1 CTX 30 8455H (SUTURE) IMPLANT
SUT VIC AB 3-0 SH 27 (SUTURE) ×1
SUT VIC AB 3-0 SH 27X BRD (SUTURE) IMPLANT
TOWEL OR 17X24 6PK STRL BLUE (TOWEL DISPOSABLE) ×4 IMPLANT
TRAY FOLEY W/BAG SLVR 14FR (SET/KITS/TRAYS/PACK) ×2 IMPLANT
TUBING NON-CON 1/4 X 20 CONN (TUBING) IMPLANT
YANKAUER SUCT BULB TIP NO VENT (SUCTIONS) IMPLANT

## 2021-12-21 NOTE — Anesthesia Preprocedure Evaluation (Signed)
Anesthesia Evaluation  Patient identified by MRN, date of birth, ID band Patient awake    Reviewed: Allergy & Precautions, H&P , NPO status , Patient's Chart, lab work & pertinent test results  Airway Mallampati: II   Neck ROM: full    Dental   Pulmonary asthma , Current Smoker and Patient abstained from smoking.   breath sounds clear to auscultation       Cardiovascular hypertension, Pt. on medications  Rhythm:regular Rate:Normal     Neuro/Psych  Headaches PSYCHIATRIC DISORDERS Anxiety Depression       GI/Hepatic ,GERD  ,,  Endo/Other  diabetes, Type 2    Renal/GU      Musculoskeletal   Abdominal  (+) + obese  Peds  Hematology   Anesthesia Other Findings   Reproductive/Obstetrics (+) Pregnancy                             Anesthesia Physical Anesthesia Plan  ASA: 3  Anesthesia Plan: Spinal   Post-op Pain Management:    Induction: Intravenous  PONV Risk Score and Plan: 2 and Ondansetron, Dexamethasone and Treatment may vary due to age or medical condition  Airway Management Planned: Natural Airway  Additional Equipment:   Intra-op Plan:   Post-operative Plan:   Informed Consent: I have reviewed the patients History and Physical, chart, labs and discussed the procedure including the risks, benefits and alternatives for the proposed anesthesia with the patient or authorized representative who has indicated his/her understanding and acceptance.       Plan Discussed with: CRNA, Anesthesiologist and Surgeon  Anesthesia Plan Comments:         Anesthesia Quick Evaluation

## 2021-12-21 NOTE — H&P (Signed)
LABOR ADMISSION HISTORY AND PHYSICAL  Bridget Scott is a 41 y.o. female G72P0110 with IUP at 9w3dby LMP presenting for cervical cerclage. She reports  No LOF, no VB, no blurry vision, headaches or peripheral edema, and RUQ pain.   Dating: By LMP --->  Estimated Date of Delivery: 06/04/22 Discussed cerclage procedure in detail including risks and benefits of bleeding, infection, involvement of other organs as well as inadvertent rupture of membranes.  Pt made aware she may have some pink discharge or spotting after the procedure.   Prenatal History/Complications:  Past Medical History: Past Medical History:  Diagnosis Date   Asthma    Cancer ([place]    lymphoma   Dental caries    periodontitis, maxillary cyst   Depression    Diabetes mellitus without complication ([facility]    GERD (gastroesophageal reflux disease)    Headache    migraines   Hodgkin lymphoma ([place]    [facility] 2020[facility]  Hypertension    Hypertensive urgency    Insomnia 03/21/2019   Pneumonia    as a baby   Seasonal allergies     Past Surgical History: Past Surgical History:  Procedure Laterality Date   CHOLECYSTECTOMY     LYMPH NODE BIOPSY     PORT-A-CATH REMOVAL     PORTACATH PLACEMENT     TOOTH EXTRACTION Bilateral 05/27/2017   Procedure: MULTIPLE EXTRACTIONS;  Surgeon: JDiona Browner DDS;  [place]  Service: Oral Surgery;  Laterality: Bilateral;    Obstetrical History: OB History     Gravida  3   Para  1   Term      Preterm  1   AB  1   Living  0      SAB  1   IAB      Ectopic      Multiple      Live Births  0        Obstetric Comments  G1: PPROM, PTL         Social History: Social History   Socioeconomic History   Marital status: Married    Spouse name: Not on file   Number of children: Not on file   Years of education: Not on file   Highest education level: Not on file  Occupational History   Not on file  Tobacco Use   Smoking  status: Every Day    Packs/day: 0.50    Types: Cigarettes   Smokeless tobacco: Never  Vaping Use   Vaping Use: Never used  Substance and Sexual Activity   Alcohol use: No   Drug use: No   Sexual activity: Yes    Birth control/protection: None  Other Topics Concern   Not on file  Social History Narrative   Not on file   Social Determinants of Health   Financial Resource Strain: Medium Risk (01/06/2021)   Overall Financial Resource Strain (CARDIA)    Difficulty of Paying Living Expenses: Somewhat hard  Food Insecurity: No Food Insecurity (12/21/2021)   Hunger Vital Sign    Worried About Running Out of Food in the Last Year: Never true    Ran Out of Food in the Last Year: Never true  Transportation Needs: No Transportation Needs (12/21/2021)   PRAPARE - THydrologist(Medical): No    Lack of Transportation (Non-Medical): No  Physical Activity: Inactive (01/06/2021)   Exercise Vital Sign    Days of Exercise  per Week: 0 days    Minutes of Exercise per Session: 0 min  Stress: Stress Concern Present (01/06/2021)   [place]    Feeling of Stress : Very much  Social Connections: Not on file    Family History: Family History  Problem Relation Age of Onset   Lung cancer Mother    Heart attack Father     Allergies: Allergies  Allergen Reactions   Robitussin Dm Max Day-Night Hives and Itching    Other reaction(s): Dizziness, Flushing, Vomiting   Analgesic Balm [Trolamine (Triethanolamine)] Rash   Bengay Pain Relief [Menthol] Rash   Guaifenesin Hives, Rash, Other (See Comments) and Nausea And Vomiting    "Made me sick" (Robitussin)   Latex Rash    Medications Prior to Admission  Medication Sig Dispense Refill Last Dose   ADVAIR HFA 115-21 MCG/ACT inhaler Inhale 2 puffs into the lungs 2 (two) times daily.      albuterol (VENTOLIN HFA) 108 (90 Base) MCG/ACT inhaler Inhale 1-2  puffs into the lungs every 6 (six) hours as needed for wheezing or shortness of breath. 18 g 0 12/20/2021   aspirin EC 81 MG tablet Take 1 tablet (81 mg total) by mouth daily. Start taking when you are [redacted] weeks pregnant for rest of pregnancy for prevention of preeclampsia 300 tablet 2 12/20/2021   Cetirizine HCl 10 MG CAPS Take 1 capsule (10 mg total) by mouth daily for 10 days. (Patient taking differently: Take 10 mg by mouth daily as needed (allergies).) 10 capsule 0    folic acid (FOLVITE) 1 MG tablet Take 1 tablet (1 mg total) by mouth daily. 30 tablet 10 12/20/2021   hydrOXYzine (ATARAX) 25 MG tablet Take 25 mg by mouth 3 (three) times daily as needed for anxiety.   12/21/2021   metFORMIN (GLUCOPHAGE) 1000 MG tablet Take 1 tablet (1,000 mg total) by mouth 2 (two) times daily with a meal. 60 tablet 2 12/20/2021   nicotine polacrilex (NICORETTE) 2 MG gum Take 2 mg by mouth as needed for smoking cessation.      NIFEdipine (PROCARDIA-XL/NIFEDICAL-XL) 30 MG 24 hr tablet Take 1 tablet (30 mg total) by mouth daily. 30 tablet 2 12/21/2021   Prenatal 28-0.8 MG TABS Take 1 tablet by mouth daily. 30 tablet 12 12/20/2021   blood glucose meter kit and supplies KIT Dispense based on patient and insurance preference. Use up to four times daily as directed. (FOR ICD-9 250.00, 250.01). 1 each 0    busPIRone (BUSPAR) 5 MG tablet Take 10 mg by mouth 3 (three) times daily.   More than a month   gabapentin (NEURONTIN) 300 MG capsule Take 300 mg by mouth at bedtime as needed (pain).   Unknown   traZODone (DESYREL) 50 MG tablet Take 50 mg by mouth at bedtime as needed for sleep.   More than a month     Review of Systems   All systems reviewed and negative except as stated in HPI  Blood pressure (!) 145/86, pulse 83, temperature 98.2 F (36.8 C), temperature source Oral, resp. rate 18, height _0  (1.651 m), weight 89.4 kg, last menstrual period 08/28/2021, SpO2 97 %. General appearance: alert, cooperative, no  distress, and mildly obese Lungs: clear to auscultation bilaterally Heart: regular rate and rhythm Abdomen: soft, non-tender; bowel sounds normal Pelvic: deferred Extremities: Homans sign is negative, no sign of DVT     Prenatal labs: ABO, Rh:   Antibody:  Rubella:   RPR:    HBsAg:    HIV:    GBS:     Prenatal Transfer Tool  Maternal Diabetes: Yes:  Diabetes Type:  Insulin/Medication controlled Significant Maternal Medications:  Meds include: Other: metformin   Results for orders placed or performed during the hospital encounter of 12/21/21 (from the past 24 hour(s))  CBC   Collection Time: 12/21/21 11:32 AM  Result Value Ref Range   WBC 12.7 (H) 4.0 - 10.5 K/uL   RBC 4.43 3.87 - 5.11 MIL/uL   Hemoglobin 14.2 12.0 - 15.0 g/dL   HCT 39.7 36.0 - 46.0 %   MCV 89.6 80.0 - 100.0 fL   MCH 32.1 26.0 - 34.0 pg   MCHC 35.8 30.0 - 36.0 g/dL   RDW 12.8 11.5 - 15.5 %   Platelets 168 150 - 400 K/uL   nRBC 0.0 0.0 - 0.2 %  Glucose, capillary   Collection Time: 12/21/21  1:02 PM  Result Value Ref Range   Glucose-Capillary 222 (H) 70 - 99 mg/dL    Patient Active Problem List   Diagnosis Date Noted   History of preterm premature rupture of membranes (PPROM) at 38 weeks 12/15/2021   Supervision of high-risk pregnancy 12/15/2021   Advanced maternal age in multigravida 12/15/2021   Strain of knee 06/15/2019   History of trauma 03/21/2019   Major depressive disorder, recurrent episode, severe with anxious distress ([place]) 03/21/2019   Panic attacks 03/21/2019   Amenorrhea 11/16/2018   Tobacco use disorder 05/27/2017   Hypertension    Diabetes mellitus without complication ([place])    Asthma    GERD (gastroesophageal reflux disease)    Hypertensive urgency    Syncope 07/23/2015   Obesity 04/26/2013   Hodgkin's disease in remission ([place]) 10/03/2012    Assessment: Bridget Scott is a 41 y.o. G3P0110 at 42w3dhere for cerclage placement  Pt will have McDonald cerclage placed  today.  Risks and benefits have been discussed.  She is aware of need for pelvic rest indefinitely after the procedure.  LGriffin Basil11/27/2023, 1:36 PM

## 2021-12-21 NOTE — Anesthesia Postprocedure Evaluation (Signed)
Anesthesia Post Note  Patient: Bridget Scott  Procedure(s) Performed: CERCLAGE CERVICAL     Patient location during evaluation: PACU Anesthesia Type: Spinal Level of consciousness: awake and alert Pain management: pain level controlled Vital Signs Assessment: post-procedure vital signs reviewed and stable Respiratory status: spontaneous breathing, nonlabored ventilation and respiratory function stable Cardiovascular status: blood pressure returned to baseline and stable Postop Assessment: no apparent nausea or vomiting Anesthetic complications: no   No notable events documented.  Last Vitals:  Vitals:   12/21/21 1633 12/21/21 1645  BP:  124/80  Pulse: 61 63  Resp: 19 15  Temp:    SpO2: 96% 97%    Last Pain:  Vitals:   12/21/21 1645  TempSrc:   PainSc: 0-No pain   Pain Goal:    LLE Motor Response: Purposeful movement (12/21/21 1645)   RLE Motor Response: Purposeful movement (12/21/21 1645)       Epidural/Spinal Function Cutaneous sensation: Able to Wiggle Toes (12/21/21 1645), Patient able to flex knees: Yes (12/21/21 1645), Patient able to lift hips off bed: Yes (12/21/21 1645), Back pain beyond tenderness at insertion site: No (12/21/21 1645), Progressively worsening motor and/or sensory loss: No (12/21/21 1645), Bowel and/or bladder incontinence post epidural: No (12/21/21 1645)  Lynda Rainwater

## 2021-12-21 NOTE — Procedures (Signed)
Bridget Scott   PROCEDURE DATE: 12/21/2021  PREOPERATIVE DIAGNOSIS: Intrauterine pregnancy at [redacted]w[redacted]d history of poor obstetrical outcome POSTOPERATIVE DIAGNOSIS: The same PROCEDURE: Transvaginal McDonald Cervical Cerclage Placement SURGEON:  Dr. LLynnda Shields INDICATIONS: 41y.o. G3P0110 at 122w3dith history of cervical incompetence, here for cerclage placement.   The risks of surgery were discussed in detail with the patient including but not limited to: bleeding; infection which may require antibiotic therapy; injury to cervix, vagina other surrounding organs; risk of ruptured membranes and/or preterm delivery and other postoperative or anesthesia complications.  Written informed consent was obtained.    FINDINGS:  About 2 cm palpable cervical length in the vagina, closed cervix, suture knot placed anteriorly.  ANESTHESIA:  Spinal INTRAVENOUS FLUIDS: 700  ml ESTIMATED BLOOD LOSS: 6 ml COMPLICATIONS: None immediate  PROCEDURE IN DETAIL:  The patient received intravenous antibiotics and had sequential compression devices applied to her lower extremities while in the preoperative area.  Reassuring fetal heart rate of 150 was also obtained using a doppler and bed side ultrasound. She was then taken to the operating room where spinal anesthesia was administered and was found to be adequate.  She was placed in the dorsal lithotomy, and was prepped and draped in a sterile manner. Her bladder was catheterized for an unmeasured amount of clear, yellow urine.  After an adequate timeout was performed, a vaginal speculum was then placed in the patient's vagina.  The anterior and posterior lips of the cervix were grasped with ring forceps. A curved needle with a 1-0 Prolene suture was inserted at 12 o'clock, as high as possible at the junction of the rugated vaginal epithelium and the smooth cervix, at least 2 cm above the external os.  Four bites are taken circumferentially around the entire cervix in a  purse-string fashion, each bite should be deep enough to extend at least midway into the cervical stroma, but not into the endocervical canal. The two ends of the suture were then tied securely anteriorly and cut, leaving the ends long enough to grasp with a clamp when it is time to remove it. There was minimal bleeding noted and the ring forceps were removed with good hemostasis noted. A small amount of bleeding was noted on the right sidewall so a figure of eight stitch of 3-0 vicryl was placed with good hemostasis.   No immediate complications noted.  All instruments were removed from the patient's vagina.  Indomethacin 100 mg rectal suppository was placed.  Instrument, needle and sponge counts were correct x 2. The patient tolerated the procedure well, and was taken to the recovery area awake and in stable condition.  Reassuring fetal heart rate was also obtained using a doppler in the recovery area.  The patient will be discharged to home as per PACU criteria.  Routine postoperative instructions given.  She was prescribed Percocet and indomethicin.  She will follow up in the clinic on 12/23/21 for postoperative evaluation and ongoing prenatal care.  LaLynnda ShieldsMD, FAMultnomahor Wo[facility]

## 2021-12-21 NOTE — Anesthesia Procedure Notes (Signed)
Spinal  Patient location during procedure: OB Start time: 12/21/2021 2:36 PM End time: 12/21/2021 2:41 PM Reason for block: surgical anesthesia Staffing Performed: other anesthesia staff  Anesthesiologist: Lynda Rainwater, MD Performed by: Lynda Rainwater, MD Authorized by: Lynda Rainwater, MD   Preanesthetic Checklist Completed: patient identified, IV checked, risks and benefits discussed, surgical consent, monitors and equipment checked, pre-op evaluation and timeout performed Spinal Block Patient position: sitting Prep: DuraPrep and site prepped and draped Patient monitoring: heart rate, cardiac monitor, continuous pulse ox and blood pressure Approach: midline Location: L3-4 Injection technique: single-shot Needle Needle type: Pencan  Needle gauge: 24 G Needle length: 10 cm Assessment Sensory level: T4 Events: CSF return Additional Notes SAB placed by SRNA under direct supervision

## 2021-12-21 NOTE — Transfer of Care (Signed)
Immediate Anesthesia Transfer of Care Note  Patient: Bridget Scott  Procedure(s) Performed: CERCLAGE CERVICAL  Patient Location: PACU  Anesthesia Type:Spinal  Level of Consciousness: awake, alert , and oriented  Airway & Oxygen Therapy: Patient Spontanous Breathing  Post-op Assessment: Report given to RN and Post -op Vital signs reviewed and stable  Post vital signs: Reviewed and stable  Last Vitals:  Vitals Value Taken Time  BP    Temp    Pulse 73 12/21/21 1531  Resp    SpO2 98 % 12/21/21 1531  Vitals shown include unvalidated device data.  Last Pain:  Vitals:   12/21/21 1124  TempSrc: Oral         Complications: No notable events documented.

## 2021-12-22 NOTE — Op Note (Signed)
Pre-procedure Diagnoses  Pregnancy with poor obstetric history [O09.299]   Post-procedure Diagnoses  Pregnancy with poor obstetric history [O09.299]   Procedures  CERVICAL CERCLAGE [[id number] (Custom)]       Signed      SEREEN SCHAFF   PROCEDURE DATE: 12/21/2021   PREOPERATIVE DIAGNOSIS: Intrauterine pregnancy at [redacted]w[redacted]d history of poor obstetrical outcome POSTOPERATIVE DIAGNOSIS: The same PROCEDURE: Transvaginal McDonald Cervical Cerclage Placement SURGEON:  Dr. LLynnda Shields  INDICATIONS: 41y.o. G3P0110 at 132w3dith history of cervical incompetence, here for cerclage placement.   The risks of surgery were discussed in detail with the patient including but not limited to: bleeding; infection which may require antibiotic therapy; injury to cervix, vagina other surrounding organs; risk of ruptured membranes and/or preterm delivery and other postoperative or anesthesia complications.  Written informed consent was obtained.     FINDINGS:  About 2 cm palpable cervical length in the vagina, closed cervix, suture knot placed anteriorly.   ANESTHESIA:  Spinal INTRAVENOUS FLUIDS: 700  ml ESTIMATED BLOOD LOSS: 6 ml COMPLICATIONS: None immediate   PROCEDURE IN DETAIL:  The patient received intravenous antibiotics and had sequential compression devices applied to her lower extremities while in the preoperative area.  Reassuring fetal heart rate of 150 was also obtained using a doppler and bed side ultrasound. She was then taken to the operating room where spinal anesthesia was administered and was found to be adequate.  She was placed in the dorsal lithotomy, and was prepped and draped in a sterile manner. Her bladder was catheterized for an unmeasured amount of clear, yellow urine.  After an adequate timeout was performed, a vaginal speculum was then placed in the patient's vagina.  The anterior and posterior lips of the cervix were grasped with ring forceps. A curved needle with a 1-0 Prolene  suture was inserted at 12 o'clock, as high as possible at the junction of the rugated vaginal epithelium and the smooth cervix, at least 2 cm above the external os.  Four bites are taken circumferentially around the entire cervix in a purse-string fashion, each bite should be deep enough to extend at least midway into the cervical stroma, but not into the endocervical canal. The two ends of the suture were then tied securely anteriorly and cut, leaving the ends long enough to grasp with a clamp when it is time to remove it. There was minimal bleeding noted and the ring forceps were removed with good hemostasis noted. A small amount of bleeding was noted on the right sidewall so a figure of eight stitch of 3-0 vicryl was placed with good hemostasis.   No immediate complications noted.  All instruments were removed from the patient's vagina.  Indomethacin 100 mg rectal suppository was placed.  Instrument, needle and sponge counts were correct x 2. The patient tolerated the procedure well, and was taken to the recovery area awake and in stable condition.  Reassuring fetal heart rate was also obtained using a doppler in the recovery area.   The patient will be discharged to home as per PACU criteria.  Routine postoperative instructions given.  She was prescribed Percocet and indomethicin.  She will follow up in the clinic on 12/23/21 for postoperative evaluation and ongoing prenatal care.   LaLynnda ShieldsMD, FAAustin[facility] WoFl[facility] Hospital[facility]

## 2021-12-23 ENCOUNTER — Other Ambulatory Visit (HOSPITAL_COMMUNITY)
Admission: RE | Admit: 2021-12-23 | Discharge: 2021-12-23 | Disposition: A | Discharge status: Home / Self Care | Payer: Medicaid Other | Source: Ambulatory Visit | Attending: Family Medicine | Admitting: Family Medicine

## 2021-12-23 ENCOUNTER — Ambulatory Visit (INDEPENDENT_AMBULATORY_CARE_PROVIDER_SITE_OTHER): Payer: Medicaid Other

## 2021-12-23 ENCOUNTER — Ambulatory Visit (INDEPENDENT_AMBULATORY_CARE_PROVIDER_SITE_OTHER): Payer: Medicaid Other | Admitting: Family Medicine

## 2021-12-23 ENCOUNTER — Encounter: Payer: Self-pay | Admitting: Family Medicine

## 2021-12-23 VITALS — BP 121/77 | HR 76 | Wt 195.0 lb

## 2021-12-23 DIAGNOSIS — O24119 Pre-existing diabetes mellitus, type 2, in pregnancy, unspecified trimester: Secondary | ICD-10-CM | POA: Insufficient documentation

## 2021-12-23 DIAGNOSIS — C8190 Hodgkin lymphoma, unspecified, unspecified site: Secondary | ICD-10-CM | POA: Diagnosis not present

## 2021-12-23 DIAGNOSIS — O24112 Pre-existing diabetes mellitus, type 2, in pregnancy, second trimester: Secondary | ICD-10-CM | POA: Diagnosis not present

## 2021-12-23 DIAGNOSIS — O09522 Supervision of elderly multigravida, second trimester: Secondary | ICD-10-CM

## 2021-12-23 DIAGNOSIS — Z3A16 16 weeks gestation of pregnancy: Secondary | ICD-10-CM | POA: Diagnosis not present

## 2021-12-23 DIAGNOSIS — J4540 Moderate persistent asthma, uncomplicated: Secondary | ICD-10-CM

## 2021-12-23 DIAGNOSIS — O0992 Supervision of high risk pregnancy, unspecified, second trimester: Secondary | ICD-10-CM | POA: Diagnosis not present

## 2021-12-23 DIAGNOSIS — Z2839 Other underimmunization status: Secondary | ICD-10-CM

## 2021-12-23 DIAGNOSIS — Z8759 Personal history of other complications of pregnancy, childbirth and the puerperium: Secondary | ICD-10-CM

## 2021-12-23 DIAGNOSIS — F332 Major depressive disorder, recurrent severe without psychotic features: Secondary | ICD-10-CM

## 2021-12-23 DIAGNOSIS — O10012 Pre-existing essential hypertension complicating pregnancy, second trimester: Secondary | ICD-10-CM | POA: Diagnosis not present

## 2021-12-23 DIAGNOSIS — O10019 Pre-existing essential hypertension complicating pregnancy, unspecified trimester: Secondary | ICD-10-CM

## 2021-12-23 DIAGNOSIS — O09892 Supervision of other high risk pregnancies, second trimester: Secondary | ICD-10-CM

## 2021-12-23 DIAGNOSIS — O169 Unspecified maternal hypertension, unspecified trimester: Secondary | ICD-10-CM | POA: Insufficient documentation

## 2021-12-23 DIAGNOSIS — O10919 Unspecified pre-existing hypertension complicating pregnancy, unspecified trimester: Secondary | ICD-10-CM | POA: Insufficient documentation

## 2021-12-23 DIAGNOSIS — F17200 Nicotine dependence, unspecified, uncomplicated: Secondary | ICD-10-CM

## 2021-12-23 MED ORDER — INSULIN GLARGINE 100 UNIT/ML ~~LOC~~ SOLN: 10.0000 [IU] | Freq: Every day | SUBCUTANEOUS | 11 refills | Status: DC

## 2021-12-23 MED ORDER — INSULIN PEN NEEDLE 30G X 8 MM MISC: 1.0000 | 1 refills | Status: DC | PRN

## 2021-12-23 NOTE — Progress Notes (Signed)
NOB [redacted]w[redacted]d Last Pap: 2020 with [facility] . Genetic Screening: Desires and wants to know gender.  Flu Vaccine: Declines   Pt had cerclage placement done on 12/21/21.  Pt would like to discuss how often appt would be with her being High Risk.

## 2021-12-23 NOTE — Progress Notes (Signed)
Subjective:   Bridget Scott is a 41 y.o. G3P0110 at 66w5dby LMP being seen today for her first obstetrical visit.  Her obstetrical history is significant for advanced maternal age, obesity, smoker, and T2DM class C on Metformin, CHTN on meds, h/o Hodgkin's Lymphoma in remission since 2013, and h/o 16 week PPROM with loss at 20 weeks. Pregnancy history fully reviewed.  Patient reports no complaints.  HISTORY: OB History  Gravida Para Term Preterm AB Living  3 1 0 1 1 0  SAB IAB Ectopic Multiple Live Births  1 0 0 0 0    # Outcome Date GA Lbr Len/2nd Weight Sex Delivery Anes PTL Lv  3 Current           2 SAB 2015          1 Preterm 2001 236w0d M         Birth Comments: Previable PPROM    Obstetric Comments  G1: PPROM, PTL   Last pap smear was 2015 and was abnormal - [facility] Past Medical History:  Diagnosis Date   Asthma    Cancer ([place]   lymphoma   Dental caries    periodontitis, maxillary cyst   Depression    Diabetes mellitus without complication ([place]   GERD (gastroesophageal reflux disease)    Headache    migraines   Hodgkin lymphoma ([place]   [facility]pertension    Hypertensive urgency    Insomnia 03/21/2019   Pneumonia    as a baby   Seasonal allergies    Past Surgical History:  Procedure Laterality Date   CERVICAL CERCLAGE N/A 12/21/2021   Procedure: CERCLAGE CERVICAL;  Surgeon: BaGriffin BasilMD;  Location: [facility] LD [facility]S;  Service: Gynecology;  Laterality: N/A;   CHOLECYSTECTOMY     LYMPH NODE BIOPSY     PORT-A-CATH REMOVAL     PORTACATH PLACEMENT     TOOTH EXTRACTION Bilateral 05/27/2017   Procedure: MULTIPLE EXTRACTIONS;  Surgeon: JeDiona BrownerDDS;  [facility];  Laterality: Bilateral;   Family History  Problem Relation Age of Onset   Hypertension Mother    Heart disease Mother    Lung cancer Mother    Cancer - Lung Mother    Hypertension Father    Heart disease Father     Heart attack Father    Hodgkin's lymphoma Sister    Hypertension Maternal Grandmother    Dementia Maternal Grandmother    Hyperlipidemia Paternal Grandmother    Hypertension Paternal Grandmother    Lung disease Paternal Grandmother    Social History   Tobacco Use   Smoking status: Every Day    Packs/day: 0.25    Types: Cigarettes   Smokeless tobacco: Never  Vaping Use   Vaping Use: Never used  Substance Use Topics   Alcohol use: No   Drug use: No   Allergies  Allergen Reactions   Robitussin Dm Max Day-Night Hives and Itching    Other reaction(s): Dizziness, Flushing, Vomiting   Analgesic Balm [Trolamine (Triethanolamine)] Rash   Bengay Pain Relief [Menthol] Rash   Guaifenesin Hives, Rash, Other (See Comments) and Nausea And Vomiting    "Made me sick" (Robitussin)   Latex Rash   Current Outpatient Medications on File Prior to Visit  Medication Sig Dispense Refill   ADVAIR HFA 115-21 MCG/ACT inhaler Inhale 2 puffs into the lungs 2 (two)  times daily.     albuterol (VENTOLIN HFA) 108 (90 Base) MCG/ACT inhaler Inhale 1-2 puffs into the lungs every 6 (six) hours as needed for wheezing or shortness of breath. 18 g 0   aspirin EC 81 MG tablet Take 1 tablet (81 mg total) by mouth daily. Start taking when you are [redacted] weeks pregnant for rest of pregnancy for prevention of preeclampsia 300 tablet 2   blood glucose meter kit and supplies KIT Dispense based on patient and insurance preference. Use up to four times daily as directed. (FOR ICD-9 250.00, 250.01). 1 each 0   busPIRone (BUSPAR) 5 MG tablet Take 10 mg by mouth 3 (three) times daily.     folic acid (FOLVITE) 1 MG tablet Take 1 tablet (1 mg total) by mouth daily. 30 tablet 10   hydrOXYzine (ATARAX) 25 MG tablet Take 25 mg by mouth 3 (three) times daily as needed for anxiety.     metFORMIN (GLUCOPHAGE) 1000 MG tablet Take 1 tablet (1,000 mg total) by mouth 2 (two) times daily with a meal. 60 tablet 2   nicotine polacrilex  (NICORETTE) 2 MG gum Take 2 mg by mouth as needed for smoking cessation.     NIFEdipine (PROCARDIA-XL/NIFEDICAL-XL) 30 MG 24 hr tablet Take 1 tablet (30 mg total) by mouth daily. 30 tablet 2   Prenatal 28-0.8 MG TABS Take 1 tablet by mouth daily. 30 tablet 12   Cetirizine HCl 10 MG CAPS Take 1 capsule (10 mg total) by mouth daily for 10 days. (Patient taking differently: Take 10 mg by mouth daily as needed (allergies).) 10 capsule 0   No current facility-administered medications on file prior to visit.     Exam   Vitals:   12/23/21 1343  BP: 121/77  Pulse: 76  Weight: 195 lb (88.5 kg)   Fetal Heart Rate (bpm): 145  Uterus:   16 week size  Pelvic Exam: Perineum: no hemorrhoids, normal perineum   Vulva: normal external genitalia, no lesions   Vagina:  normal mucosa, normal discharge   Cervix: no lesions and normal, pap smear done, stitch in place, cx is closed and long.    Adnexa: normal adnexa and no mass, fullness, tenderness   Bony Pelvis: average  System: General: well-developed, well-nourished female in no acute distress   Breast:  normal appearance, no masses or tenderness   Skin: normal coloration and turgor, no rashes   Neurologic: oriented, normal, negative, normal mood   Extremities: normal strength, tone, and muscle mass, ROM of all joints is normal   HEENT PERRLA, extraocular movement intact and sclera clear, anicteric   Mouth/Teeth mucous membranes moist, pharynx normal without lesions and dental hygiene good   Neck supple and no masses   Cardiovascular: regular rate and rhythm   Respiratory:  no respiratory distress, normal breath sounds   Abdomen: soft, non-tender; bowel sounds normal; no masses,  no organomegaly   Limited u/s shows single IUP Assessment:   Pregnancy: G3P0110 Patient Active Problem List   Diagnosis Date Noted   Type 2 diabetes mellitus affecting pregnancy, antepartum 12/23/2021   Hypertension in pregnancy, antepartum 12/23/2021   History of  preterm premature rupture of membranes (PPROM) at 29 weeks 12/15/2021   Supervision of high-risk pregnancy 12/15/2021   Advanced maternal age in multigravida 12/15/2021   Chronic bronchitis ([place]) 11/11/2021   Cervical radiculopathy 04/09/2021   History of trauma 03/21/2019   Major depressive disorder, recurrent episode, severe with anxious distress ([place]) 03/21/2019   Panic attacks  03/21/2019   Tobacco use disorder 05/27/2017   Hypertension    Diabetes mellitus without complication (HCC)    Asthma    GERD (gastroesophageal reflux disease)    Obesity 04/26/2013   Hodgkin's disease in remission ([place]) 10/03/2012     Plan:  1. Supervision of high risk pregnancy in second trimester New OB labs - CBC/D/Plt+RPR+Rh+ABO+RubIgG... - AFP, Serum, Open Spina Bifida - Culture, OB Urine - Cytology - PAP - HORIZON CUSTOM - US OB Limited; Future  2. Type 2 diabetes mellitus affecting pregnancy, antepartum Baseline labs To see Ophtho ASAP To see D & N mgmt tomorrow Reports CBGs are in the 150s now on Metformin 1000 mg daily--Add long acting insulin--readjust at next visit with log Written instructions in AVS On ASA - Protein / creatinine ratio, urine - TSH - Comprehensive metabolic panel - Hemoglobin A1c - Ambulatory referral to Ophthalmology - insulin glargine (LANTUS) 100 UNIT/ML injection; Inject 0.1 mLs (10 Units total) into the skin at bedtime.  Dispense: 10 mL; Refill: 11 - Insulin Pen Needle (NOVOFINE) 30G X 8 MM MISC; Inject 10 each into the skin as needed.  Dispense: 100 each; Refill: 1  3. Pre-existing essential hypertension during pregnancy, antepartum BP is much improved on Procardia On ASA  4. Hodgkin's disease in remission ([place]) In remission To f/u at [facility] as directed  5. Moderate persistent asthma without complication On Advair and Albuterol and not needing rescue frequently  6. Major depressive disorder, recurrent episode, severe with anxious distress (HCC) On Buspar  and Hydroxyzine  7. History of preterm premature rupture of membranes (PPROM) at 20 weeks Cerclage in place  8. Tobacco use Has gum to use prn  9. Multigravida of advanced maternal age in second trimester NIPT today - PANORAMA PRENATAL TEST FULL PANEL  Initial labs drawn. Continue prenatal vitamins. Genetic Screening discussed, NIPS: ordered. Ultrasound discussed; fetal anatomic survey:  scheduled . Problem list reviewed and updated. The nature of [place] with multiple MDs and other Advanced Practice Providers was explained to patient; also emphasized that residents, students are part of our team. Routine obstetric precautions reviewed. Return in about 2 weeks (around 01/06/2022) for [facility], needs MD.

## 2021-12-23 NOTE — Progress Notes (Signed)
Patient informed that the ultrasound is considered a limited obstetric ultrasound and is not intended to be a complete ultrasound exam.  Patient also informed that the ultrasound is not being completed with the intent of assessing for fetal or placental anomalies or any pelvic abnormalities. Explained that the purpose of today's ultrasound is to assess for dating  Patient acknowledges the purpose of the exam and the limitations of the study.

## 2021-12-23 NOTE — Patient Instructions (Signed)
Following an appropriate diet and keeping your blood sugar under control is the most important thing to do for your health and that of your unborn baby.  Please check your blood sugar 4 times daily.  Please keep accurate BS logs and bring them with you to every visit.  Please bring your meter also.  Goals for Blood sugar should be: 1. Fasting (first thing in the morning before eating) should be less than 90.   2.  2 hours after meals should be less than 120.  Please eat 3 meals and 3 snacks.  Include protein (meat, dairy-cheese, eggs, nuts) with all meals.  Be mindful that carbohydrates increase your blood sugar.  Not just sweet food (cookies, cake, donuts, fruit, juice, soda) but also bread, pasta, rice, and potatoes.  You have to limit how many carbs you are eating.  Adding exercise, as little as 30 minutes a day can decrease your blood sugar.  

## 2021-12-24 ENCOUNTER — Other Ambulatory Visit: Payer: Medicaid Other

## 2021-12-25 ENCOUNTER — Ambulatory Visit: Payer: Medicaid Other | Admitting: Cardiology

## 2021-12-25 LAB — CBC/D/PLT+RPR+RH+ABO+RUBIGG...
Antibody Screen: NEGATIVE
Basophils Absolute: 0 10*3/uL (ref 0.0–0.2)
Basos: 0 %
EOS (ABSOLUTE): 0 10*3/uL (ref 0.0–0.4)
Eos: 0 %
HCV Ab: NONREACTIVE
HIV Screen 4th Generation wRfx: NONREACTIVE
Hematocrit: 44.2 % (ref 34.0–46.6)
Hemoglobin: 15.2 g/dL (ref 11.1–15.9)
Hepatitis B Surface Ag: NEGATIVE
Immature Grans (Abs): 0 10*3/uL (ref 0.0–0.1)
Immature Granulocytes: 0 %
Lymphocytes Absolute: 2.1 10*3/uL (ref 0.7–3.1)
Lymphs: 21 %
MCH: 31.6 pg (ref 26.6–33.0)
MCHC: 34.4 g/dL (ref 31.5–35.7)
MCV: 92 fL (ref 79–97)
Monocytes Absolute: 0.5 10*3/uL (ref 0.1–0.9)
Monocytes: 5 %
Neutrophils Absolute: 7.5 10*3/uL — ABNORMAL HIGH (ref 1.4–7.0)
Neutrophils: 74 %
Platelets: 192 10*3/uL (ref 150–450)
RBC: 4.81 x10E6/uL (ref 3.77–5.28)
RDW: 12.9 % (ref 11.7–15.4)
RPR Ser Ql: NONREACTIVE
Rh Factor: POSITIVE
Rubella Antibodies, IGG: <0.9 index — ABNORMAL LOW (ref >0.99)
WBC: 10.3 10*3/uL (ref 3.4–10.8)

## 2021-12-25 LAB — COMPREHENSIVE METABOLIC PANEL
ALT: 7 IU/L (ref 0–32)
AST: 8 IU/L (ref 0–40)
Albumin/Globulin Ratio: 1.7 (ref 1.2–2.2)
Albumin: 4.1 g/dL (ref 3.9–4.9)
Alkaline Phosphatase: 69 IU/L (ref 44–121)
BUN/Creatinine Ratio: 20 (ref 9–23)
BUN: 9 mg/dL (ref 6–24)
Bilirubin Total: 0.4 mg/dL (ref 0.0–1.2)
CO2: 16 mmol/L — ABNORMAL LOW (ref 20–29)
Calcium: 9.6 mg/dL (ref 8.7–10.2)
Chloride: 103 mmol/L (ref 96–106)
Creatinine, Ser: 0.45 mg/dL — ABNORMAL LOW (ref 0.57–1.00)
Globulin, Total: 2.4 g/dL (ref 1.5–4.5)
Glucose: 203 mg/dL — ABNORMAL HIGH (ref 70–99)
Potassium: 4.1 mmol/L (ref 3.5–5.2)
Sodium: 139 mmol/L (ref 134–144)
Total Protein: 6.5 g/dL (ref 6.0–8.5)
eGFR: 125 mL/min/{1.73_m2} (ref >59)

## 2021-12-25 LAB — AFP, SERUM, OPEN SPINA BIFIDA
AFP MoM: 0.74
AFP Value: 22.8 ng/mL
Gest. Age on Collection Date: 16.5 weeks
Maternal Age At EDD: 40.8 yr
OSBR Risk 1 IN: 10000
Test Results:: NEGATIVE
Weight: 195 [lb_av]

## 2021-12-25 LAB — HCV INTERPRETATION

## 2021-12-25 LAB — URINE CULTURE, OB REFLEX: Organism ID, Bacteria: NO GROWTH

## 2021-12-25 LAB — HEMOGLOBIN A1C
Est. average glucose Bld gHb Est-mCnc: 203 mg/dL
Hgb A1c MFr Bld: 8.7 % — ABNORMAL HIGH (ref 4.8–5.6)

## 2021-12-25 LAB — CULTURE, OB URINE

## 2021-12-25 LAB — PROTEIN / CREATININE RATIO, URINE
Creatinine, Urine: 117.9 mg/dL
Protein, Ur: 41.8 mg/dL
Protein/Creat Ratio: 355 mg/g creat — ABNORMAL HIGH (ref 0–200)

## 2021-12-25 LAB — TSH: TSH: 2.02 u[IU]/mL (ref 0.450–4.500)

## 2021-12-28 DIAGNOSIS — Z2839 Other underimmunization status: Secondary | ICD-10-CM | POA: Insufficient documentation

## 2021-12-28 LAB — CYTOLOGY - PAP
Comment: NEGATIVE
Diagnosis: UNDETERMINED — AB
High risk HPV: NEGATIVE

## 2021-12-29 ENCOUNTER — Encounter: Payer: Self-pay | Admitting: Cardiology

## 2021-12-29 ENCOUNTER — Ambulatory Visit: Payer: Medicaid Other | Attending: Cardiology | Admitting: Cardiology

## 2021-12-29 VITALS — BP 148/86 | HR 72 | Ht 65.0 in | Wt 192.2 lb

## 2021-12-29 DIAGNOSIS — E119 Type 2 diabetes mellitus without complications: Secondary | ICD-10-CM | POA: Diagnosis not present

## 2021-12-29 DIAGNOSIS — Z3A17 17 weeks gestation of pregnancy: Secondary | ICD-10-CM

## 2021-12-29 DIAGNOSIS — I119 Hypertensive heart disease without heart failure: Secondary | ICD-10-CM | POA: Diagnosis not present

## 2021-12-29 DIAGNOSIS — I10 Essential (primary) hypertension: Secondary | ICD-10-CM | POA: Diagnosis not present

## 2021-12-29 DIAGNOSIS — F17200 Nicotine dependence, unspecified, uncomplicated: Secondary | ICD-10-CM

## 2021-12-29 DIAGNOSIS — Z794 Long term (current) use of insulin: Secondary | ICD-10-CM

## 2021-12-29 DIAGNOSIS — O09522 Supervision of elderly multigravida, second trimester: Secondary | ICD-10-CM

## 2021-12-29 DIAGNOSIS — O10919 Unspecified pre-existing hypertension complicating pregnancy, unspecified trimester: Secondary | ICD-10-CM

## 2021-12-29 DIAGNOSIS — O0990 Supervision of high risk pregnancy, unspecified, unspecified trimester: Secondary | ICD-10-CM

## 2021-12-29 MED ORDER — NIFEDIPINE ER 60 MG PO TB24: 60.0000 mg | ORAL_TABLET | Freq: Every day | ORAL | 0 refills | Status: DC

## 2021-12-29 NOTE — Patient Instructions (Signed)
Medication Instructions:  Your physician has recommended you make the following change in your medication:  INCREASE: Nifedipine '60mg'$  daily.  *If you need a refill on your cardiac medications before your next appointment, please call your pharmacy*   Lab Work: NONE If you have labs (blood work) drawn today and your tests are completely normal, you will receive your results only by: [place] (if you have MyChart) OR A paper copy in the mail If you have any lab test that is abnormal or we need to change your treatment, we will call you to review the results.   Testing/Procedures: Your physician has requested that you have an echocardiogram. Echocardiography is a painless test that uses sound waves to create images of your heart. It provides your doctor with information about the size and shape of your heart and how well your heart's chambers and valves are working. This procedure takes approximately one hour. There are no restrictions for this procedure. Please do NOT wear cologne, perfume, aftershave, or lotions (deodorant is allowed). Please arrive 15 minutes prior to your appointment time.    Follow-Up: At [facility], you and your health needs are our priority.  As part of our continuing mission to provide you with exceptional heart care, we have created designated Provider Care Teams.  These Care Teams include your primary Cardiologist (physician) and Advanced Practice Providers (APPs -  Physician Assistants and Nurse Practitioners) who all work together to provide you with the care you need, when you need it.  We recommend signing up for the patient portal called "MyChart".  Sign up information is provided on this After Visit Summary.  MyChart is used to connect with patients for Virtual Visits (Telemedicine).  Patients are able to view lab/test results, encounter notes, upcoming appointments, etc.  Non-urgent messages can be sent to your provider as well.   To learn  more about what you can do with MyChart, go to [email or web address].    Your next appointment:   4 week(s) It's okay to overbook.   The format for your next appointment:   In Person  Provider:   Berniece Salines, DO

## 2021-12-29 NOTE — Progress Notes (Signed)
Cardio-Obstetrics Clinic  New Evaluation  Date:  12/29/2021   ID:  Bridget Scott, DOB 03/05/1980, MRN [id number]  PCP:  Augusto Gamble, [place] Providers Cardiologist:  None  Electrophysiologist:  None       Referring MD: Augusto Gamble, DO   Chief Complaint: ' I am ok"  History of Present Illness:    Bridget Scott is a 41 y.o. female [G3P0110] who is being seen today for the evaluation of chronic hypertension in pregnancy at the request of Augusto Gamble, DO.   Medical history diabetes type 2 which was diagnosed in 2013 after she started chemo for her Hodgkin lymphoma, hypertension, Hodgkin lymphoma now in remission since 2014 GERD, hyperlipidemia, family history of premature coronary artery disease, tells me that she was told that she had renal artery stenosis, COPD here today to be evaluated for elevated blood pressure.  She was initially on lisinopril and was transitioned to nifedipine after she was pregnant.  This seems to be helping with her blood pressure but not quite under control.   Prior CV Studies Reviewed: The following studies were reviewed today:  TTE in 2017  Normal left ventricular systolic function, ejection fraction 55 to 60%   Normal right ventricular systolic function   No significant valvular abnormalities   Left Ventricle  The left ventricle is normal in size with normal wall thickness.  The left ventricular systolic function is normal.  The ejection fraction is visually estimated at 55 to 60%.  There is normal left ventricular diastolic function.   Right Ventricle  The right ventricle is normal in size with normal wall thickness.  The right ventricular systolic function is normal.   Left Atrium  The left atrium is normal in size.   Right Atrium  The right atrium is normal in size.   IVC/SVC  The IVC diameter is <=21 mm with >50% decrease in size with inspiration suggesting normal right atrial pressure (0-5 mm  Hg).   Mitral Valve  The mitral valve leaflets are normal with normal leaflet mobility.  There is trivial mitral regurgitation by color and continuous wave Doppler imaging.   Tricuspid Valve  The tricuspid valve leaflets are normal with normal leaflet mobility.  There is trivial tricuspid regurgitation by color and continuous wave Doppler imaging.  - The Doppler signal is suboptimal and pulmonary artery systolic pressure cannot be accurately estimated.   Aortic Valve  The aortic valve is trileaflet with normal excursion.  There is no significant aortic regurgitation by color and continuous wave Doppler imaging.  There is no evidence of a significant transvalvular gradient.  - Peak transvalvular velocity: 1.25 m/sec   Pulmonic Valve  The pulmonic valve is normal.  There is trivial pulmonic regurgitation by color and continuous wave Doppler imaging.  There is no evidence of a significant transvalvular gradient.  - Peak transvalvular velocity: 1.1 m/sec   Ascending Aorta  The aorta is normal in size in the visualized segments.   Pericardium  There is no evidence of a significant pericardial effusion.   Pulmonary Artery  The pulmonary artery is normal in size.   Past Medical History:  Diagnosis Date   Asthma    Cancer ([place])    lymphoma   Dental caries    periodontitis, maxillary cyst   Depression    Diabetes mellitus without complication (HCC)    GERD (gastroesophageal reflux disease)    Headache    migraines   Hodgkin lymphoma ([place])  in remission as of 2020. followed by [facility]   Hypertension    Hypertensive urgency    Insomnia 03/21/2019   Pneumonia    as a baby   Seasonal allergies     Past Surgical History:  Procedure Laterality Date   CERVICAL CERCLAGE N/A 12/21/2021   Procedure: CERCLAGE CERVICAL;  Surgeon: Griffin Basil, MD;  Location: [facility];  Service: Gynecology;  Laterality: N/A;   CHOLECYSTECTOMY     LYMPH NODE BIOPSY     PORT-A-CATH REMOVAL      PORTACATH PLACEMENT     TOOTH EXTRACTION Bilateral 05/27/2017   Procedure: MULTIPLE EXTRACTIONS;  Surgeon: Diona Browner, DDS;  Location: [place];  Service: Oral Surgery;  Laterality: Bilateral;      OB History     Gravida  3   Para  1   Term      Preterm  1   AB  1   Living  0      SAB  1   IAB      Ectopic      Multiple      Live Births  0        Obstetric Comments  G1: PPROM, PTL             Current Medications: Current Meds  Medication Sig   ADVAIR HFA 115-21 MCG/ACT inhaler Inhale 2 puffs into the lungs 2 (two) times daily.   albuterol (VENTOLIN HFA) 108 (90 Base) MCG/ACT inhaler Inhale 1-2 puffs into the lungs every 6 (six) hours as needed for wheezing or shortness of breath.   aspirin EC 81 MG tablet Take 1 tablet (81 mg total) by mouth daily. Start taking when you are [redacted] weeks pregnant for rest of pregnancy for prevention of preeclampsia   blood glucose meter kit and supplies KIT Dispense based on patient and insurance preference. Use up to four times daily as directed. (FOR ICD-9 250.00, 250.01).   busPIRone (BUSPAR) 5 MG tablet Take 10 mg by mouth 3 (three) times daily.   Cetirizine HCl 10 MG CAPS Take 1 capsule (10 mg total) by mouth daily for 10 days. (Patient taking differently: Take 10 mg by mouth daily as needed (allergies).)   folic acid (FOLVITE) 1 MG tablet Take 1 tablet (1 mg total) by mouth daily.   hydrOXYzine (ATARAX) 25 MG tablet Take 25 mg by mouth 3 (three) times daily as needed for anxiety.   insulin glargine (LANTUS) 100 UNIT/ML injection Inject 0.1 mLs (10 Units total) into the skin at bedtime.   Insulin Glargine Solostar (LANTUS) 100 UNIT/ML Solostar Pen Inject into the skin.   Insulin Pen Needle (NOVOFINE) 30G X 8 MM MISC Inject 10 each into the skin as needed.   metFORMIN (GLUCOPHAGE) 1000 MG tablet Take 1 tablet (1,000 mg total) by mouth 2 (two) times daily with a meal.   nicotine polacrilex (NICORETTE) 2 MG gum Take 2 mg by mouth as  needed for smoking cessation.   Prenatal 28-0.8 MG TABS Take 1 tablet by mouth daily.   [DISCONTINUED] NIFEdipine (PROCARDIA-XL/NIFEDICAL-XL) 30 MG 24 hr tablet Take 1 tablet (30 mg total) by mouth daily.     Allergies:   Robitussin dm max day-night, Analgesic balm [trolamine (triethanolamine)], Bengay pain relief [menthol], Guaifenesin, and Latex   Social History   Socioeconomic History   Marital status: Married    Spouse name: Not on file   Number of children: Not on file   Years of education: Not on  file   Highest education level: Not on file  Occupational History   Not on file  Tobacco Use   Smoking status: Every Day    Packs/day: 0.25    Types: Cigarettes   Smokeless tobacco: Never  Vaping Use   Vaping Use: Never used  Substance and Sexual Activity   Alcohol use: No   Drug use: No   Sexual activity: Yes    Birth control/protection: None  Other Topics Concern   Not on file  Social History Narrative   Not on file   Social Determinants of Health   Financial Resource Strain: Medium Risk (01/06/2021)   Overall Financial Resource Strain (CARDIA)    Difficulty of Paying Living Expenses: Somewhat hard  Food Insecurity: No Food Insecurity (12/21/2021)   Hunger Vital Sign    Worried About Running Out of Food in the Last Year: Never true    Ran Out of Food in the Last Year: Never true  Transportation Needs: No Transportation Needs (12/21/2021)   PRAPARE - Hydrologist (Medical): No    Lack of Transportation (Non-Medical): No  Physical Activity: Inactive (01/06/2021)   Exercise Vital Sign    Days of Exercise per Week: 0 days    Minutes of Exercise per Session: 0 min  Stress: Stress Concern Present (01/06/2021)   [place]    Feeling of Stress : Very much  Social Connections: Not on file      Family History  Problem Relation Age of Onset   Hypertension Mother    Heart  disease Mother    Lung cancer Mother    Cancer - Lung Mother    Hypertension Father    Heart disease Father    Heart attack Father    Hodgkin's lymphoma Sister    Hypertension Maternal Grandmother    Dementia Maternal Grandmother    Hyperlipidemia Paternal Grandmother    Hypertension Paternal Grandmother    Lung disease Paternal Grandmother       ROS:   Please see the history of present illness.     All other systems reviewed and are negative.   Labs/EKG Reviewed:    EKG:   EKG is was ordered today.  The ekg ordered today demonstrates sinus rhythm, heart rate 72 bpm with arrhythmia.  Recent Labs: 12/23/2021: ALT 7; BUN 9; Creatinine, Ser 0.45; Hemoglobin 15.2; Platelets 192; Potassium 4.1; Sodium 139; TSH 2.020   Recent Lipid Panel No results found for: "CHOL", "TRIG", "HDL", "CHOLHDL", "LDLCALC", "LDLDIRECT"  Physical Exam:    VS:  BP (!) 148/86 (BP Location: Left Arm)   Pulse 72   Ht _0  (1.651 m)   Wt 192 lb 3.2 oz (87.2 kg)   LMP 08/28/2021 (Approximate)   BMI 31.98 kg/m     Wt Readings from Last 3 Encounters:  12/29/21 192 lb 3.2 oz (87.2 kg)  12/23/21 195 lb (88.5 kg)  12/21/21 197 lb 3.2 oz (89.4 kg)     GEN:  Well nourished, well developed in no acute distress HEENT: Normal NECK: No JVD; No carotid bruits LYMPHATICS: No lymphadenopathy CARDIAC: RRR, no murmurs, rubs, gallops RESPIRATORY:  Clear to auscultation without rales, wheezing or rhonchi  ABDOMEN: Soft, non-tender, non-distended MUSCULOSKELETAL:  No edema; No deformity  SKIN: Warm and dry NEUROLOGIC:  Alert and oriented x 3 PSYCHIATRIC:  Normal affect    Risk Assessment/Risk Calculators:     CARPREG II Risk Prediction Index Score:  1.  The patient's risk for a primary cardiac event is 5%.            ASSESSMENT & PLAN:    Chronic hypertension in pregnancy Current smoker Diabetes mellitus Obesity pregnancy  She is hypertensive in the office today, she is currently on  nifedipine 30 mg daily I will like to increase that to 60 for her target of less than 140/90 mmHg.  In terms of her diabetes is being managed on insulin and also, refer her to our pharmacist to follow also during pregnancy for both follow-ups for hypertension as well as diabetes.  She does have significant family history of coronary artery disease she is a current smoker and she does have diabetes, hypertension once she deliver we will pursue preventative/likely calcium score for ASCVD risk modification.  She will eventually need to be on a statin postpartum.  We discussed smoking cessation.  She has been started on the patch and she is willing to consider quitting smoking though this is difficult for her.  Pregnancy weight gain should be limited to 11- 20 pounds  Patient Instructions  Medication Instructions:  Your physician has recommended you make the following change in your medication:  INCREASE: Nifedipine 37m daily.  *If you need a refill on your cardiac medications before your next appointment, please call your pharmacy*   Lab Work: NONE If you have labs (blood work) drawn today and your tests are completely normal, you will receive your results only by: [facility](if you have MyChart) OR A paper copy in the mail If you have any lab test that is abnormal or we need to change your treatment, we will call you to review the results.   Testing/Procedures: Your physician has requested that you have an echocardiogram. Echocardiography is a painless test that uses sound waves to create images of your heart. It provides your doctor with information about the size and shape of your heart and how well your heart's chambers and valves are working. This procedure takes approximately one hour. There are no restrictions for this procedure. Please do NOT wear cologne, perfume, aftershave, or lotions (deodorant is allowed). Please arrive 15 minutes prior to your appointment  time.    Follow-Up: [facility] you and your health needs are our priority.  As part of our continuing mission to provide you with exceptional heart care, we have created designated Provider Care Teams.  These Care Teams include your primary Cardiologist (physician) and Advanced Practice Providers (APPs -  Physician Assistants and Nurse Practitioners) who all work together to provide you with the care you need, when you need it.  We recommend signing up for the patient portal called "MyChart".  Sign up information is provided on this After Visit Summary.  MyChart is used to connect with patients for Virtual Visits (Telemedicine).  Patients are able to view lab/test results, encounter notes, upcoming appointments, etc.  Non-urgent messages can be sent to your provider as well.   To learn more about what you can do with MyChart, go to [email or web address]    Your next appointment:   4 week(s) It's okay to overbook.   The format for your next appointment:   In Person  Provider:   KBerniece Salines DO    Dispo:  Return in about 4 weeks (around 01/26/2022).   Medication Adjustments/Labs and Tests Ordered: Current medicines are reviewed at length with the patient today.  Concerns regarding medicines are outlined above.  Tests Ordered: Orders Placed  This Encounter  Procedures   EKG 12-Lead   ECHOCARDIOGRAM COMPLETE   Medication Changes: Meds ordered this encounter  Medications   NIFEdipine (ADALAT CC) 60 MG 24 hr tablet    Sig: Take 1 tablet (60 mg total) by mouth daily.    Dispense:  90 tablet    Refill:  0

## 2022-01-05 ENCOUNTER — Ambulatory Visit (INDEPENDENT_AMBULATORY_CARE_PROVIDER_SITE_OTHER): Payer: Medicaid Other | Admitting: Obstetrics & Gynecology

## 2022-01-05 ENCOUNTER — Encounter: Payer: Self-pay | Admitting: Obstetrics & Gynecology

## 2022-01-05 VITALS — BP 137/82 | HR 81 | Wt 193.0 lb

## 2022-01-05 DIAGNOSIS — Z3A16 16 weeks gestation of pregnancy: Secondary | ICD-10-CM

## 2022-01-05 DIAGNOSIS — O24119 Pre-existing diabetes mellitus, type 2, in pregnancy, unspecified trimester: Secondary | ICD-10-CM

## 2022-01-05 DIAGNOSIS — Z8759 Personal history of other complications of pregnancy, childbirth and the puerperium: Secondary | ICD-10-CM

## 2022-01-05 DIAGNOSIS — O99612 Diseases of the digestive system complicating pregnancy, second trimester: Secondary | ICD-10-CM

## 2022-01-05 DIAGNOSIS — O0992 Supervision of high risk pregnancy, unspecified, second trimester: Secondary | ICD-10-CM

## 2022-01-05 DIAGNOSIS — K5900 Constipation, unspecified: Secondary | ICD-10-CM

## 2022-01-05 DIAGNOSIS — O10019 Pre-existing essential hypertension complicating pregnancy, unspecified trimester: Secondary | ICD-10-CM

## 2022-01-05 LAB — PANORAMA PRENATAL TEST FULL PANEL:PANORAMA TEST PLUS 5 ADDITIONAL MICRODELETIONS: FETAL FRACTION: 4.1

## 2022-01-05 LAB — HORIZON CUSTOM: REPORT SUMMARY: NEGATIVE

## 2022-01-05 MED ORDER — INSULIN GLARGINE SOLOSTAR 100 UNIT/ML ~~LOC~~ SOPN: 10.0000 [IU] | PEN_INJECTOR | Freq: Every day | SUBCUTANEOUS | 10 refills | Status: DC

## 2022-01-05 MED ORDER — INSULIN GLARGINE 100 UNIT/ML ~~LOC~~ SOLN: 10.0000 [IU] | Freq: Every day | SUBCUTANEOUS | 11 refills | Status: DC

## 2022-01-05 MED ORDER — INSULIN PEN NEEDLE 30G X 8 MM MISC: 1.0000 | 1 refills | Status: AC | PRN

## 2022-01-05 NOTE — Patient Instructions (Signed)
  Safe Medications in Pregnancy  that are over the counter  Acne:  Benzoyl Peroxide  Salicylic Acid  *NO-Retin A  Backache/Headache:  Tylenol: 2 regular strength every 4 hours OR               2 Extra strength every 6 hours   Colds/Coughs/Allergies: Allegra Benadryl (alcohol free) 25 mg every 6 hours as needed  Breath right strips  Claritin  Cepacol throat lozenges  Chloraseptic throat spray  Cold-Eeze- up to three times per day  Cough drops, alcohol free  Flonase Guaifenesin  Mucinex (plain/DM) Nasacort Robitussin DM (plain only, alcohol free)  Saline nasal spray/drops Steroid nasal sprays Sudafed (pseudoephedrine) & Actifed * use only after [redacted] weeks gestation and if you do not have high blood pressure  Tylenol  Vicks Vaporub  Zicam Zinc lozenges  Zyrtec   -Make sure to not take anything that has the active ingredient Phenylephrine   Constipation:  Colace  Ducolax suppositories  Fleet enema  Glycerin suppositories  Metamucil  Milk of magnesia  Miralax  Senokot  Smooth move tea   Diarrhea:  Kaopectate  Imodium A-D   *NO Pepto Bismol   Hemorrhoids:  Anusol  Anusol HC  Preparation H  Tucks   Indigestion:  Tums  Maalox  Mylanta  Nexium Pepcid  Zantac Prevacid Protonix Prilosec   Insomnia:  Benadryl (alcohol free) '25mg'$  every 6 hours as needed  Tylenol PM  Unisom  Leg Cramps:  Tums  Magnesium tablets  Nausea/Vomiting:  Bonine  Dramamine  Emetrol  Ginger extract  Sea bands  Meclizine   Nausea medication to take during pregnancy:  Unisom (doxylamine succinate 25 mg tablets) Take one tablet daily at bedtime. If symptoms are not adequately controlled, the dose can be increased to a maximum recommended dose of two tablets daily (1/2 tablet in the morning, 1/2 tablet mid-afternoon and one at bedtime).  Vitamin B6 '100mg'$  tablets. Take one tablet twice a day (up to 200 mg per day).   Skin Rashes:  Aveeno products  Benadryl cream or  Benadryl tablets '25mg'$  every 6 hours as needed  Calamine Lotion  1% Hydrocortisone cream   Yeast infection:  Gyne-lotrimin 7  Monistat 3 or 7day    **If taking multiple medications, please check labels to avoid duplicating the same active ingredients  **take medication as directed on the label ** ** Do not exceed 4000 mg of Tylenol in 24 hours**  **Do not take medications that contain Aspirin or Ibuprofen** Unless your doctor has prescribed low dose Aspirin for prevention of  Pre-eclampsia

## 2022-01-05 NOTE — Progress Notes (Signed)
PRENATAL VISIT NOTE  Subjective:  Bridget Scott is a 41 y.o. G3P0110 at 22w3dbeing seen today for ongoing prenatal care.  She is currently monitored for the following issues for this high-risk pregnancy and has Hypertension; Diabetes mellitus without complication ([place]; Asthma; GERD (gastroesophageal reflux disease); Tobacco use disorder; History of trauma; Hodgkin's disease in remission ([place]; Major depressive disorder, recurrent episode, severe with anxious distress ([place]; Obesity; Panic attacks; History of preterm premature rupture of membranes (PPROM) at 20 weeks; Supervision of high-risk pregnancy; Advanced maternal age in multigravida; Cervical radiculopathy; Chronic bronchitis ([place]; Type 2 diabetes mellitus affecting pregnancy, antepartum; Hypertension in pregnancy, antepartum; and Rubella non-immune status, antepartum on their problem list.  Patient reports  constipation and some lower abdominal pain . Bo bowel movement since cerclage placement on  12/21/21.  Contractions: Not present. Vag. Bleeding: None.  Movement: Absent. Denies leaking of fluid.   The following portions of the patient's history were reviewed and updated as appropriate: allergies, current medications, past family history, past medical history, past social history, past surgical history and problem list.   Objective:   Vitals:   01/05/22 1453  BP: 137/82  Pulse: 81  Weight: 193 lb (87.5 kg)    Fetal Status: Fetal Heart Rate (bpm): 147   Movement: Absent     General:  Alert, oriented and cooperative. Patient is in no acute distress.  Skin: Skin is warm and dry. No rash noted.   Cardiovascular: Normal heart rate noted  Respiratory: Normal respiratory effort, no problems with respiration noted  Abdomen: Soft, gravid, appropriate for gestational age.  Pain/Pressure: Present     Pelvic: Cervical exam deferred        Extremities: Normal range of motion.  Edema: None  Mental Status: Normal mood and affect.  Normal behavior. Normal judgment and thought content.   Assessment and Plan:  Pregnancy: G3P0110 at 164w3d. Type 2 diabetes mellitus affecting pregnancy, antepartum Patient has not been able to get insulin as her pharmacy says it is on back order. [facility] said [facility].  Prescribed both the Lantus and Solostar, they can prescribe whichever one they have.  Patient did not bring log, was told to bring log/meter to next appointment. Reports sugars as high as 200s, but just on Metformin 1000 mg po bid for now. Will continue close monitoring.  - insulin glargine (LANTUS) 100 UNIT/ML injection; Inject 0.1 mLs (10 Units total) into the skin at bedtime.  Dispense: 10 mL; Refill: 11 - Insulin Glargine Solostar (LANTUS) 100 UNIT/ML Solostar Pen; Inject 10 Units into the skin at bedtime.  Dispense: 15 mL; Refill: 10 - Insulin Pen Needle (NOVOFINE) 30G X 8 MM MISC; Inject 10 each into the skin as needed.  Dispense: 100 each; Refill: 1  2. Pre-existing essential hypertension during pregnancy, antepartum Stable BP on Nifedipine XR 60 mg daily. Followed by Dr. [facility] MassonCardiology).  3. History of preterm premature rupture of membranes (PPROM) at 20 weeks Cerclage in place, no symptoms.  Precautions reviewed.  4. Constipation during pregnancy in second trimester Given list of OTAC remedies to use. Told to let usKoreanow if this is not resolved soon.  5. [redacted] weeks gestation of pregnancy 6. Supervision of high risk pregnancy in second trimester Dating was changed based on 14 week scan. Will follow up upcoming scan on 01/14/22 and see if there needs to be a change.  No other complaints or concerns.  Routine obstetric precautions reviewed.  Please refer to After Visit Summary for other counseling recommendations.   Return in about 2 weeks (around 01/19/2022) for OFFICE OB VISIT (MD only).  Future Appointments  Date Time Provider Ali Molina   01/14/2022  8:45 AM [facility] NURSE [facility]  01/14/2022  9:00 AM [facility] US1 [facility]  01/20/2022  9:00 AM Tobb, Kardie, DO [facility] None  01/27/2022 11:35 AM [id number]  02/01/2022  3:30 PM [name], MD [facility]    Verita Schneiders, MD

## 2022-01-14 ENCOUNTER — Ambulatory Visit: Payer: Medicaid Other | Admitting: *Deleted

## 2022-01-14 ENCOUNTER — Other Ambulatory Visit: Payer: Self-pay | Admitting: *Deleted

## 2022-01-14 ENCOUNTER — Other Ambulatory Visit: Payer: Self-pay | Admitting: Obstetrics & Gynecology

## 2022-01-14 ENCOUNTER — Ambulatory Visit: Payer: Medicaid Other | Attending: Obstetrics & Gynecology

## 2022-01-14 ENCOUNTER — Encounter: Payer: Self-pay | Admitting: *Deleted

## 2022-01-14 VITALS — BP 135/71 | HR 73

## 2022-01-14 DIAGNOSIS — O99512 Diseases of the respiratory system complicating pregnancy, second trimester: Secondary | ICD-10-CM

## 2022-01-14 DIAGNOSIS — Z794 Long term (current) use of insulin: Secondary | ICD-10-CM

## 2022-01-14 DIAGNOSIS — O09899 Supervision of other high risk pregnancies, unspecified trimester: Secondary | ICD-10-CM | POA: Insufficient documentation

## 2022-01-14 DIAGNOSIS — Z3A17 17 weeks gestation of pregnancy: Secondary | ICD-10-CM

## 2022-01-14 DIAGNOSIS — O10019 Pre-existing essential hypertension complicating pregnancy, unspecified trimester: Secondary | ICD-10-CM

## 2022-01-14 DIAGNOSIS — O24112 Pre-existing diabetes mellitus, type 2, in pregnancy, second trimester: Secondary | ICD-10-CM | POA: Diagnosis not present

## 2022-01-14 DIAGNOSIS — O10012 Pre-existing essential hypertension complicating pregnancy, second trimester: Secondary | ICD-10-CM

## 2022-01-14 DIAGNOSIS — O24119 Pre-existing diabetes mellitus, type 2, in pregnancy, unspecified trimester: Secondary | ICD-10-CM | POA: Insufficient documentation

## 2022-01-14 DIAGNOSIS — C8190 Hodgkin lymphoma, unspecified, unspecified site: Secondary | ICD-10-CM

## 2022-01-14 DIAGNOSIS — O09212 Supervision of pregnancy with history of pre-term labor, second trimester: Secondary | ICD-10-CM | POA: Diagnosis not present

## 2022-01-14 DIAGNOSIS — I10 Essential (primary) hypertension: Secondary | ICD-10-CM | POA: Insufficient documentation

## 2022-01-14 DIAGNOSIS — O3432 Maternal care for cervical incompetence, second trimester: Secondary | ICD-10-CM

## 2022-01-14 DIAGNOSIS — O0992 Supervision of high risk pregnancy, unspecified, second trimester: Secondary | ICD-10-CM | POA: Insufficient documentation

## 2022-01-14 DIAGNOSIS — F17210 Nicotine dependence, cigarettes, uncomplicated: Secondary | ICD-10-CM

## 2022-01-14 DIAGNOSIS — Z3A15 15 weeks gestation of pregnancy: Secondary | ICD-10-CM

## 2022-01-14 DIAGNOSIS — E669 Obesity, unspecified: Secondary | ICD-10-CM

## 2022-01-14 DIAGNOSIS — Z7984 Long term (current) use of oral hypoglycemic drugs: Secondary | ICD-10-CM

## 2022-01-14 DIAGNOSIS — O09522 Supervision of elderly multigravida, second trimester: Secondary | ICD-10-CM

## 2022-01-14 DIAGNOSIS — E119 Type 2 diabetes mellitus without complications: Secondary | ICD-10-CM | POA: Insufficient documentation

## 2022-01-14 DIAGNOSIS — Z8759 Personal history of other complications of pregnancy, childbirth and the puerperium: Secondary | ICD-10-CM | POA: Insufficient documentation

## 2022-01-14 DIAGNOSIS — O99332 Smoking (tobacco) complicating pregnancy, second trimester: Secondary | ICD-10-CM

## 2022-01-14 DIAGNOSIS — Z2839 Other underimmunization status: Secondary | ICD-10-CM | POA: Insufficient documentation

## 2022-01-14 DIAGNOSIS — O10912 Unspecified pre-existing hypertension complicating pregnancy, second trimester: Secondary | ICD-10-CM

## 2022-01-14 DIAGNOSIS — J45909 Unspecified asthma, uncomplicated: Secondary | ICD-10-CM

## 2022-01-14 DIAGNOSIS — C819A Hodgkin lymphoma, unspecified, in remission: Secondary | ICD-10-CM

## 2022-01-14 DIAGNOSIS — F332 Major depressive disorder, recurrent severe without psychotic features: Secondary | ICD-10-CM

## 2022-01-14 DIAGNOSIS — E66811 Obesity, class 1: Secondary | ICD-10-CM

## 2022-01-14 DIAGNOSIS — O24912 Unspecified diabetes mellitus in pregnancy, second trimester: Secondary | ICD-10-CM

## 2022-01-20 ENCOUNTER — Ambulatory Visit: Payer: Medicaid Other | Attending: Cardiology | Admitting: Cardiology

## 2022-01-24 ENCOUNTER — Inpatient Hospital Stay (HOSPITAL_COMMUNITY)
Admission: AD | Admit: 2022-01-24 | Discharge: 2022-01-24 | Disposition: A | Discharge status: Home / Self Care | Payer: Medicaid Other | Attending: Obstetrics and Gynecology | Admitting: Obstetrics and Gynecology

## 2022-01-24 ENCOUNTER — Encounter: Payer: Self-pay | Admitting: Advanced Practice Midwife

## 2022-01-24 ENCOUNTER — Other Ambulatory Visit: Payer: Self-pay

## 2022-01-24 ENCOUNTER — Ambulatory Visit
Admission: EM | Admit: 2022-01-24 | Discharge: 2022-01-24 | Discharge status: Against Medical Advice | Payer: Medicaid Other

## 2022-01-24 DIAGNOSIS — Z1152 Encounter for screening for COVID-19: Secondary | ICD-10-CM | POA: Diagnosis not present

## 2022-01-24 DIAGNOSIS — R059 Cough, unspecified: Secondary | ICD-10-CM | POA: Diagnosis not present

## 2022-01-24 DIAGNOSIS — J101 Influenza due to other identified influenza virus with other respiratory manifestations: Secondary | ICD-10-CM | POA: Insufficient documentation

## 2022-01-24 DIAGNOSIS — O99512 Diseases of the respiratory system complicating pregnancy, second trimester: Secondary | ICD-10-CM | POA: Insufficient documentation

## 2022-01-24 DIAGNOSIS — Z3A19 19 weeks gestation of pregnancy: Secondary | ICD-10-CM | POA: Diagnosis not present

## 2022-01-24 DIAGNOSIS — Z20828 Contact with and (suspected) exposure to other viral communicable diseases: Secondary | ICD-10-CM | POA: Diagnosis not present

## 2022-01-24 DIAGNOSIS — Z3492 Encounter for supervision of normal pregnancy, unspecified, second trimester: Secondary | ICD-10-CM

## 2022-01-24 DIAGNOSIS — Z79899 Other long term (current) drug therapy: Secondary | ICD-10-CM | POA: Insufficient documentation

## 2022-01-24 LAB — RESP PANEL BY RT-PCR (RSV, FLU A&B, COVID)  RVPGX2
Influenza A by PCR: POSITIVE — AB
Influenza B by PCR: NEGATIVE
Resp Syncytial Virus by PCR: NEGATIVE
SARS Coronavirus 2 by RT PCR: NEGATIVE

## 2022-01-24 MED ORDER — BENZONATATE 100 MG PO CAPS: 100.0000 mg | ORAL_CAPSULE | Freq: Three times a day (TID) | ORAL | 0 refills | Status: DC

## 2022-01-24 NOTE — MAU Note (Signed)
Pt reports to mau with c/o cough that started Wednesday.  Pt states cough has "eased up" since then but wants to make sure baby is ok. Denies vag bleeding, abd pain, or LOF.  Pt reports her 40 year old daughter tested pos for RSV last night in the ER.

## 2022-01-24 NOTE — Discharge Instructions (Signed)

## 2022-01-24 NOTE — MAU Provider Note (Signed)
History     CSN: [id number]  Arrival date and time: 01/24/22 1631   Event Date/Time   First Provider Initiated Contact with Patient 01/24/22 1656      Chief Complaint  Patient presents with   Cough   HPI Bridget Scott is a 41 y.o. G3P0110 at 55w1dwho presents to MAU with chief complaint of mild cough, new onset about 5 days ago. Patient states her daughter was [facility] Patient states her coughing was frequent enough she was worried her baby would be harmed. She denies all other complaints including SOB, chest pain, activity intolerance, abdominal pain, vaginal bleeding and fever.  [facility]  OB History     Gravida  3   Para  1   Term      Preterm  1   AB  1   Living  0      SAB  1   IAB      Ectopic      Multiple      Live Births  0        Obstetric Comments  G1: PPROM, PTL         Past Medical History:  Diagnosis Date   Asthma    Cancer (HSalem    lymphoma   Dental caries    periodontitis, maxillary cyst   Depression    Diabetes mellitus without complication (HCC)    GERD (gastroesophageal reflux disease)    Headache    migraines   Hodgkin lymphoma ([place]    [facility] 2020[facility]  Hypertension    Hypertensive urgency    Insomnia 03/21/2019   Pneumonia    as a baby   Seasonal allergies     Past Surgical History:  Procedure Laterality Date   CERVICAL CERCLAGE N/A 12/21/2021   Procedure: CERCLAGE CERVICAL;  Surgeon: BGriffin Basil MD;  Location: [facility] LD [facility]S;  Service: Gynecology;  Laterality: N/A;   CHOLECYSTECTOMY     LYMPH NODE BIOPSY     PORT-A-CATH REMOVAL     PORTACATH PLACEMENT     TOOTH EXTRACTION Bilateral 05/27/2017   Procedure: MULTIPLE EXTRACTIONS;  Surgeon: JDiona Browner DDS;  [place] [facility]  Service: Oral Surgery;  Laterality: Bilateral;    Family History  Problem Relation Age of Onset   Hypertension Mother    Heart  disease Mother    Lung cancer Mother    Cancer - Lung Mother    Hypertension Father    Heart disease Father    Heart attack Father    Hodgkin's lymphoma Sister    Hypertension Maternal Grandmother    Dementia Maternal Grandmother    Hyperlipidemia Paternal Grandmother    Hypertension Paternal Grandmother    Lung disease Paternal Grandmother     Social History   Tobacco Use   Smoking status: Every Day    Packs/day: 0.25    Types: Cigarettes   Smokeless tobacco: Never  Vaping Use   Vaping Use: Never used  Substance Use Topics   Alcohol use: No   Drug use: No    Allergies:  Allergies  Allergen Reactions   Robitussin Dm Max Day-Night Hives and Itching    Other reaction(s): Dizziness, Flushing, Vomiting   Analgesic Balm [Trolamine (Triethanolamine)] Rash   Bengay Pain Relief [Menthol] Rash   Guaifenesin Hives, Rash, Other (See Comments) and Nausea And Vomiting    "Made me  sick" (Robitussin)   Latex Rash    Medications Prior to Admission  Medication Sig Dispense Refill Last Dose   ADVAIR HFA 115-21 MCG/ACT inhaler Inhale 2 puffs into the lungs 2 (two) times daily.      albuterol (VENTOLIN HFA) 108 (90 Base) MCG/ACT inhaler Inhale 1-2 puffs into the lungs every 6 (six) hours as needed for wheezing or shortness of breath. 18 g 0    aspirin EC 81 MG tablet Take 1 tablet (81 mg total) by mouth daily. Start taking when you are [redacted] weeks pregnant for rest of pregnancy for prevention of preeclampsia 300 tablet 2    blood glucose meter kit and supplies KIT Dispense based on patient and insurance preference. Use up to four times daily as directed. (FOR ICD-9 250.00, 250.01). 1 each 0    busPIRone (BUSPAR) 5 MG tablet Take 10 mg by mouth 3 (three) times daily.      Cetirizine HCl 10 MG CAPS Take 1 capsule (10 mg total) by mouth daily for 10 days. (Patient taking differently: Take 10 mg by mouth daily as needed (allergies).) 10 capsule 0    folic acid (FOLVITE) 1 MG tablet Take 1 tablet  (1 mg total) by mouth daily. 30 tablet 10    hydrOXYzine (ATARAX) 25 MG tablet Take 25 mg by mouth 3 (three) times daily as needed for anxiety.      insulin glargine (LANTUS) 100 UNIT/ML injection Inject 0.1 mLs (10 Units total) into the skin at bedtime. 10 mL 11    Insulin Glargine Solostar (LANTUS) 100 UNIT/ML Solostar Pen Inject 10 Units into the skin at bedtime. 15 mL 10    Insulin Pen Needle (NOVOFINE) 30G X 8 MM MISC Inject 10 each into the skin as needed. 100 each 1    metFORMIN (GLUCOPHAGE) 1000 MG tablet Take 1 tablet (1,000 mg total) by mouth 2 (two) times daily with a meal. 60 tablet 2    nicotine polacrilex (NICORETTE) 2 MG gum Take 2 mg by mouth as needed for smoking cessation.      NIFEdipine (ADALAT CC) 60 MG 24 hr tablet Take 1 tablet (60 mg total) by mouth daily. 90 tablet 0    Prenatal 28-0.8 MG TABS Take 1 tablet by mouth daily. 30 tablet 12     Review of Systems  Respiratory:  Positive for cough.   All other systems reviewed and are negative.  Physical Exam   Blood pressure 130/84, pulse 80, temperature 97.7 F (36.5 C), resp. rate 18, height _0  (1.651 m), weight 86 kg, last menstrual period 08/28/2021, SpO2 98 %.  Physical Exam Vitals and nursing note reviewed. Exam conducted with a chaperone present.  Constitutional:      General: She is not in acute distress.    Appearance: Normal appearance. She is not ill-appearing.  Cardiovascular:     Rate and Rhythm: Normal rate and regular rhythm.     Pulses: Normal pulses.     Heart sounds: Normal heart sounds.  Pulmonary:     Effort: Pulmonary effort is normal.     Breath sounds: Normal breath sounds.  Abdominal:     Comments: Gravid  Skin:    Capillary Refill: Capillary refill takes less than 2 seconds.  Neurological:     Mental Status: She is alert and oriented to person, place, and time.  Psychiatric:        Mood and Affect: Mood normal.        Behavior: Behavior normal.  Thought Content: Thought  content normal.        Judgment: Judgment normal.     MAU Course  Procedures  MDM  --Lungs with very slight bilateral wheezing likely consistent with patient's baseline given hx of asthma, tobacco use, and chronic bronchitis --Patient agrees she is not currently experiencing worrying symptoms. She does not desire lab work or workup for acute symptoms at this time --Patient reports she has Albuterol and Advair at home with active refills available as needed  Orders Placed This Encounter  Procedures   Resp panel by RT-PCR (RSV, Flu A&B, Covid) Anterior Nasal Swab   Airborne and Contact precautions   Discharge patient   Meds ordered this encounter  Medications   benzonatate (TESSALON) 100 MG capsule    Sig: Take 1 capsule (100 mg total) by mouth every 8 (eight) hours.    Dispense:  21 capsule    Refill:  0    Order Specific Question:   Supervising Provider    Answer:   Griffin Basil [[id number]]     Assessment and Plan  --41 y.o. G3P0110 at [redacted]w[redacted]d --FCarrington135 by Doppler --RSV exposure, currently mild cough --SARS swab collected --Discharge home in stable condition, CNM to call with results  F/U: --1836: Attempted to notify patient of Flu A diagnosis, VLTCB  SDarlina Rumpf MMalone MSN, CNM 01/24/2022, 6:45 PM

## 2022-01-25 ENCOUNTER — Ambulatory Visit: Payer: Self-pay

## 2022-01-27 ENCOUNTER — Other Ambulatory Visit (HOSPITAL_COMMUNITY): Payer: Medicaid Other

## 2022-01-27 ENCOUNTER — Encounter: Payer: Self-pay | Admitting: Cardiology

## 2022-02-01 ENCOUNTER — Encounter: Payer: Self-pay | Admitting: Obstetrics and Gynecology

## 2022-02-01 ENCOUNTER — Encounter (HOSPITAL_COMMUNITY): Payer: Self-pay | Admitting: Cardiology

## 2022-02-01 ENCOUNTER — Ambulatory Visit (INDEPENDENT_AMBULATORY_CARE_PROVIDER_SITE_OTHER): Payer: Medicaid Other | Admitting: Obstetrics and Gynecology

## 2022-02-01 VITALS — BP 120/76 | HR 77 | Wt 191.0 lb

## 2022-02-01 DIAGNOSIS — Z6831 Body mass index (BMI) 31.0-31.9, adult: Secondary | ICD-10-CM

## 2022-02-01 DIAGNOSIS — O24112 Pre-existing diabetes mellitus, type 2, in pregnancy, second trimester: Secondary | ICD-10-CM

## 2022-02-01 DIAGNOSIS — O3430 Maternal care for cervical incompetence, unspecified trimester: Secondary | ICD-10-CM | POA: Insufficient documentation

## 2022-02-01 DIAGNOSIS — O99210 Obesity complicating pregnancy, unspecified trimester: Secondary | ICD-10-CM

## 2022-02-01 DIAGNOSIS — O24119 Pre-existing diabetes mellitus, type 2, in pregnancy, unspecified trimester: Secondary | ICD-10-CM | POA: Diagnosis not present

## 2022-02-01 DIAGNOSIS — E119 Type 2 diabetes mellitus without complications: Secondary | ICD-10-CM

## 2022-02-01 DIAGNOSIS — O99512 Diseases of the respiratory system complicating pregnancy, second trimester: Secondary | ICD-10-CM

## 2022-02-01 DIAGNOSIS — O10919 Unspecified pre-existing hypertension complicating pregnancy, unspecified trimester: Secondary | ICD-10-CM

## 2022-02-01 DIAGNOSIS — O09522 Supervision of elderly multigravida, second trimester: Secondary | ICD-10-CM

## 2022-02-01 DIAGNOSIS — Z3A20 20 weeks gestation of pregnancy: Secondary | ICD-10-CM | POA: Diagnosis not present

## 2022-02-01 DIAGNOSIS — O99212 Obesity complicating pregnancy, second trimester: Secondary | ICD-10-CM

## 2022-02-01 DIAGNOSIS — C8190 Hodgkin lymphoma, unspecified, unspecified site: Secondary | ICD-10-CM

## 2022-02-01 DIAGNOSIS — O3432 Maternal care for cervical incompetence, second trimester: Secondary | ICD-10-CM

## 2022-02-01 DIAGNOSIS — J101 Influenza due to other identified influenza virus with other respiratory manifestations: Secondary | ICD-10-CM

## 2022-02-01 DIAGNOSIS — O10912 Unspecified pre-existing hypertension complicating pregnancy, second trimester: Secondary | ICD-10-CM

## 2022-02-01 DIAGNOSIS — J45909 Unspecified asthma, uncomplicated: Secondary | ICD-10-CM

## 2022-02-01 LAB — HEMOGLOBIN A1C
Est. average glucose Bld gHb Est-mCnc: 169 mg/dL
Hgb A1c MFr Bld: 7.5 % — ABNORMAL HIGH (ref 4.8–5.6)

## 2022-02-01 MED ORDER — FAMOTIDINE 20 MG PO TABS: 20.0000 mg | ORAL_TABLET | Freq: Two times a day (BID) | ORAL | 3 refills | Status: AC

## 2022-02-01 MED ORDER — INSULIN GLARGINE 100 UNIT/ML ~~LOC~~ SOLN: 13.0000 [IU] | Freq: Every day | SUBCUTANEOUS | 11 refills | Status: DC

## 2022-02-01 NOTE — Progress Notes (Signed)
ROB [redacted]w[redacted]d +Flu 2 wks ago.  Pt forgot blood sugar log able to discuss.   Pt and husband have concerns regarding recent U/S. And questions regarding delivery.

## 2022-02-01 NOTE — Progress Notes (Unsigned)
   PRENATAL VISIT NOTE  Subjective:  Bridget Scott is a 42 y.o. G3P0110 at 36w2dbeing seen today for ongoing prenatal care.  She is currently monitored for the following issues for this {Blank single:19197::"high-risk","low-risk"} pregnancy and has Hypertension; Diabetes mellitus without complication (HHalchita; Asthma affecting pregnancy in second trimester; GERD (gastroesophageal reflux disease); Tobacco use disorder; History of trauma; Hodgkin's disease in remission (HRemer; Major depressive disorder, recurrent episode, severe with anxious distress (H[place]; Obesity; Panic attacks; History of preterm premature rupture of membranes (PPROM) at 20 weeks; Supervision of high-risk pregnancy; Advanced maternal age in multigravida; Cervical radiculopathy; Chronic bronchitis (HSullivan's [place]; Type 2 diabetes mellitus affecting pregnancy, antepartum; Chronic hypertension affecting pregnancy; Rubella non-immune status, antepartum; Influenza A; and Cervical cerclage suture present on their problem list.  Patient reports {sx:14538}.  Contractions: Not present. Vag. Bleeding: None.  Movement: Present. Denies leaking of fluid.   The following portions of the patient's history were reviewed and updated as appropriate: allergies, current medications, past family history, past medical history, past social history, past surgical history and problem list.   Objective:   Vitals:   02/01/22 1544  BP: 120/76  Pulse: 77  Weight: 191 lb (86.6 kg)    Fetal Status: Fetal Heart Rate (bpm): 147   Movement: Present     General:  Alert, oriented and cooperative. Patient is in no acute distress.  Skin: Skin is warm and dry. No rash noted.   Cardiovascular: Normal heart rate noted  Respiratory: Normal respiratory effort, no problems with respiration noted  Abdomen: Soft, gravid, appropriate for gestational age.  Pain/Pressure: Absent     Pelvic: {Blank single:19197::"Cervical exam performed in the presence of a chaperone","Cervical  exam deferred"}        Extremities: Normal range of motion.  Edema: Trace  Mental Status: Normal mood and affect. Normal behavior. Normal judgment and thought content.   Assessment and Plan:  Pregnancy: G3P0110 at 268w2d. Diabetes mellitus without complication (HCDavidson*** - Ambulatory referral to Ophthalmology  2. Type 2 diabetes mellitus affecting pregnancy, antepartum *** - Hemoglobin A1c - insulin glargine (LANTUS) 100 UNIT/ML injection; Inject 0.13 mLs (13 Units total) into the skin at bedtime.  Dispense: 10 mL; Refill: 11 - Ambulatory referral to Ophthalmology  3. [redacted] weeks gestation of pregnancy *** - Hemoglobin A1c - Ambulatory referral to Ophthalmology  4. Chronic hypertension affecting pregnancy ***  5. Influenza A ***  6. Cervical cerclage suture present in second trimester ***  7. Hodgkin's disease in remission (HCTrail Creek***  8. Multigravida of advanced maternal age in second trimester ***  9. Obesity in pregnancy, antepartum ***  10. BMI 31.0-31.9,adult ***  11. Asthma affecting pregnancy in second trimester ***  {Blank single:19197::"Term","Preterm"} labor symptoms and general obstetric precautions including but not limited to vaginal bleeding, contractions, leaking of fluid and fetal movement were reviewed in detail with the patient. Please refer to After Visit Summary for other counseling recommendations.   No follow-ups on file.  Future Appointments  Date Time Provider De[place]win [place]1/10/2022  1:10 PM PrDonnamae JudeMD [facility]  02/04/2022  2:30 PM [facility] NURSE [facility]1/11/2022  2:45 PM [facility] US7 [facility]2/07/2022  8:45 AM C[facility]A LAB [facility]  03/03/2022 10:55 AM PiAletha HalimMD [facility]    ChAletha HalimMD

## 2022-02-03 ENCOUNTER — Encounter: Payer: Medicaid Other | Admitting: Family Medicine

## 2022-02-04 ENCOUNTER — Ambulatory Visit: Payer: Medicaid Other | Admitting: *Deleted

## 2022-02-04 ENCOUNTER — Ambulatory Visit: Payer: Medicaid Other | Attending: Obstetrics and Gynecology

## 2022-02-04 VITALS — BP 118/75 | HR 88

## 2022-02-04 DIAGNOSIS — O99212 Obesity complicating pregnancy, second trimester: Secondary | ICD-10-CM | POA: Diagnosis not present

## 2022-02-04 DIAGNOSIS — Z3A20 20 weeks gestation of pregnancy: Secondary | ICD-10-CM

## 2022-02-04 DIAGNOSIS — O10919 Unspecified pre-existing hypertension complicating pregnancy, unspecified trimester: Secondary | ICD-10-CM | POA: Insufficient documentation

## 2022-02-04 DIAGNOSIS — Z794 Long term (current) use of insulin: Secondary | ICD-10-CM | POA: Diagnosis not present

## 2022-02-04 DIAGNOSIS — J45909 Unspecified asthma, uncomplicated: Secondary | ICD-10-CM | POA: Diagnosis not present

## 2022-02-04 DIAGNOSIS — O3432 Maternal care for cervical incompetence, second trimester: Secondary | ICD-10-CM | POA: Diagnosis not present

## 2022-02-04 DIAGNOSIS — O24912 Unspecified diabetes mellitus in pregnancy, second trimester: Secondary | ICD-10-CM | POA: Diagnosis present

## 2022-02-04 DIAGNOSIS — Z2839 Other underimmunization status: Secondary | ICD-10-CM

## 2022-02-04 DIAGNOSIS — O09899 Supervision of other high risk pregnancies, unspecified trimester: Secondary | ICD-10-CM | POA: Insufficient documentation

## 2022-02-04 DIAGNOSIS — O10012 Pre-existing essential hypertension complicating pregnancy, second trimester: Secondary | ICD-10-CM | POA: Diagnosis not present

## 2022-02-04 DIAGNOSIS — O10912 Unspecified pre-existing hypertension complicating pregnancy, second trimester: Secondary | ICD-10-CM | POA: Diagnosis present

## 2022-02-04 DIAGNOSIS — O99512 Diseases of the respiratory system complicating pregnancy, second trimester: Secondary | ICD-10-CM | POA: Diagnosis not present

## 2022-02-04 DIAGNOSIS — O09212 Supervision of pregnancy with history of pre-term labor, second trimester: Secondary | ICD-10-CM | POA: Diagnosis not present

## 2022-02-04 DIAGNOSIS — O24112 Pre-existing diabetes mellitus, type 2, in pregnancy, second trimester: Secondary | ICD-10-CM

## 2022-02-04 DIAGNOSIS — Z7984 Long term (current) use of oral hypoglycemic drugs: Secondary | ICD-10-CM

## 2022-02-04 DIAGNOSIS — O24119 Pre-existing diabetes mellitus, type 2, in pregnancy, unspecified trimester: Secondary | ICD-10-CM

## 2022-02-04 DIAGNOSIS — E669 Obesity, unspecified: Secondary | ICD-10-CM

## 2022-02-04 DIAGNOSIS — E119 Type 2 diabetes mellitus without complications: Secondary | ICD-10-CM

## 2022-02-04 DIAGNOSIS — O09522 Supervision of elderly multigravida, second trimester: Secondary | ICD-10-CM | POA: Diagnosis not present

## 2022-02-05 ENCOUNTER — Other Ambulatory Visit: Payer: Self-pay | Admitting: *Deleted

## 2022-02-05 DIAGNOSIS — O10912 Unspecified pre-existing hypertension complicating pregnancy, second trimester: Secondary | ICD-10-CM

## 2022-02-05 DIAGNOSIS — O09522 Supervision of elderly multigravida, second trimester: Secondary | ICD-10-CM

## 2022-02-05 DIAGNOSIS — O24112 Pre-existing diabetes mellitus, type 2, in pregnancy, second trimester: Secondary | ICD-10-CM

## 2022-02-05 DIAGNOSIS — O3432 Maternal care for cervical incompetence, second trimester: Secondary | ICD-10-CM

## 2022-02-05 DIAGNOSIS — C8190 Hodgkin lymphoma, unspecified, unspecified site: Secondary | ICD-10-CM

## 2022-02-05 DIAGNOSIS — O09899 Supervision of other high risk pregnancies, unspecified trimester: Secondary | ICD-10-CM

## 2022-02-16 ENCOUNTER — Other Ambulatory Visit: Payer: Self-pay | Admitting: Obstetrics & Gynecology

## 2022-02-16 DIAGNOSIS — I10 Essential (primary) hypertension: Secondary | ICD-10-CM

## 2022-02-17 ENCOUNTER — Ambulatory Visit (INDEPENDENT_AMBULATORY_CARE_PROVIDER_SITE_OTHER): Payer: Medicaid Other | Admitting: Family Medicine

## 2022-02-17 VITALS — BP 132/83 | HR 73 | Wt 190.0 lb

## 2022-02-17 DIAGNOSIS — Z3A22 22 weeks gestation of pregnancy: Secondary | ICD-10-CM

## 2022-02-17 DIAGNOSIS — O09523 Supervision of elderly multigravida, third trimester: Secondary | ICD-10-CM

## 2022-02-17 DIAGNOSIS — O10919 Unspecified pre-existing hypertension complicating pregnancy, unspecified trimester: Secondary | ICD-10-CM

## 2022-02-17 DIAGNOSIS — O3433 Maternal care for cervical incompetence, third trimester: Secondary | ICD-10-CM

## 2022-02-17 DIAGNOSIS — O3432 Maternal care for cervical incompetence, second trimester: Secondary | ICD-10-CM

## 2022-02-17 DIAGNOSIS — O24112 Pre-existing diabetes mellitus, type 2, in pregnancy, second trimester: Secondary | ICD-10-CM

## 2022-02-17 DIAGNOSIS — O365990 Maternal care for other known or suspected poor fetal growth, unspecified trimester, not applicable or unspecified: Secondary | ICD-10-CM

## 2022-02-17 DIAGNOSIS — O09522 Supervision of elderly multigravida, second trimester: Secondary | ICD-10-CM

## 2022-02-17 DIAGNOSIS — O365921 Maternal care for other known or suspected poor fetal growth, second trimester, fetus 1: Secondary | ICD-10-CM

## 2022-02-17 DIAGNOSIS — O24119 Pre-existing diabetes mellitus, type 2, in pregnancy, unspecified trimester: Secondary | ICD-10-CM

## 2022-02-17 DIAGNOSIS — O0993 Supervision of high risk pregnancy, unspecified, third trimester: Secondary | ICD-10-CM

## 2022-02-17 DIAGNOSIS — O09892 Supervision of other high risk pregnancies, second trimester: Secondary | ICD-10-CM

## 2022-02-17 DIAGNOSIS — O10912 Unspecified pre-existing hypertension complicating pregnancy, second trimester: Secondary | ICD-10-CM

## 2022-02-17 DIAGNOSIS — Z8759 Personal history of other complications of pregnancy, childbirth and the puerperium: Secondary | ICD-10-CM

## 2022-02-17 DIAGNOSIS — Z2839 Other underimmunization status: Secondary | ICD-10-CM

## 2022-02-17 MED ORDER — NIFEDIPINE ER 60 MG PO TB24: 60.0000 mg | ORAL_TABLET | Freq: Every day | ORAL | 0 refills | Status: DC

## 2022-02-17 NOTE — Progress Notes (Signed)
   PRENATAL VISIT NOTE  Subjective:  Bridget Scott is a 42 y.o. G3P0110 at 5w4dbeing seen today for ongoing prenatal care.  She is currently monitored for the following issues for this high-risk pregnancy and has Hypertension; Diabetes mellitus without complication (HHarbor [place]; Asthma affecting pregnancy in second trimester; GERD (gastroesophageal reflux disease); Tobacco use disorder; History of trauma; Hodgkin's disease in remission (HGypsum; Major depressive disorder, recurrent episode, severe with anxious distress (HLong Branch; Obesity; Panic attacks; History of preterm premature rupture of membranes (PPROM) at 20 weeks; Supervision of high-risk pregnancy; Advanced maternal age in multigravida; Cervical radiculopathy; Chronic bronchitis (HLafayette; Type 2 diabetes mellitus affecting pregnancy, antepartum; Chronic hypertension affecting pregnancy; Rubella non-immune status, antepartum; Cervical cerclage suture present; and Fetal growth restriction antepartum on their problem list.  Patient reports no complaints.  Contractions: Not present. Vag. Bleeding: None.  Movement: Present. Denies leaking of fluid.   The following portions of the patient's history were reviewed and updated as appropriate: allergies, current medications, past family history, past medical history, past social history, past surgical history and problem list.   Objective:   Vitals:   02/17/22 1555  BP: 132/83  Pulse: 73  Weight: 190 lb (86.2 kg)    Fetal Status: Fetal Heart Rate (bpm): 142   Movement: Present     General:  Alert, oriented and cooperative. Patient is in no acute distress.  Skin: Skin is warm and dry. No rash noted.   Cardiovascular: Normal heart rate noted  Respiratory: Normal respiratory effort, no problems with respiration noted  Abdomen: Soft, gravid, appropriate for gestational age.  Pain/Pressure: Absent     Pelvic: Cervical exam deferred        Extremities: Normal range of motion.  Edema: None  Mental Status:  Normal mood and affect. Normal behavior. Normal judgment and thought content.   Assessment and Plan:  Pregnancy: G3P0110 at [redacted]w[redacted]d. Chronic hypertension affecting pregnancy On ASA--continue Nifedipine - NIFEdipine (ADALAT CC) 60 MG 24 hr tablet; Take 1 tablet (60 mg total) by mouth daily.  Dispense: 90 tablet; Refill: 0 - AMB Referral to Ca[place]2. Type 2 diabetes mellitus affecting pregnancy, antepartum Reports CBGs 100s post prandial. - AMB Referral to Ca[facility] University3. Cervical cerclage suture present in third trimester In place  4. Supervision of high risk pregnancy in third trimester   5. Rubella non-immune status, antepartum Needs MMR pp  6. Multigravida of advanced maternal age in third trimester LR NIPT  7. History of preterm premature rupture of membranes (PPROM) at 20 weeks Has cerclage in place  8. Fetal growth restriction antepartum F/u growth next week  Preterm labor symptoms and general obstetric precautions including but not limited to vaginal bleeding, contractions, leaking of fluid and fetal movement were reviewed in detail with the patient. Please refer to After Visit Summary for other counseling recommendations.   Return in 4 weeks (on 03/17/2022).  Future Appointments  Date Time Provider DeHawthorn Woods2/01/2022  9:45 AM [facility] NURSE [facility]MP[facility] Inc[facility]2/01/2022 10:00 AM [facility] US1 [facility]2/07/2022 10:55 AM PiAletha HalimMD [facility]  03/31/2022  4:10 PM PrDonnamae JudeMD [facility]  04/14/2022  3:30 PM PrDonnamae JudeMD [facility]    TaDonnamae JudeMD

## 2022-02-17 NOTE — Progress Notes (Signed)
ROB [redacted]w[redacted]d Pt has been w/o B/P x 2 days now per pt pharmacy told pt  Rx was not approved by providers office.  Pt has been having HA's, no swelling , no visual changes.   Pt has concerns about EDD.

## 2022-02-24 ENCOUNTER — Ambulatory Visit: Payer: Medicaid Other | Admitting: Cardiology

## 2022-02-25 ENCOUNTER — Ambulatory Visit: Payer: Medicaid Other | Admitting: *Deleted

## 2022-02-25 ENCOUNTER — Other Ambulatory Visit: Payer: Self-pay | Admitting: *Deleted

## 2022-02-25 ENCOUNTER — Ambulatory Visit: Payer: Medicaid Other | Attending: Obstetrics and Gynecology

## 2022-02-25 ENCOUNTER — Ambulatory Visit: Payer: Medicaid Other | Attending: Obstetrics | Admitting: Obstetrics

## 2022-02-25 VITALS — BP 153/76 | HR 61

## 2022-02-25 DIAGNOSIS — O99212 Obesity complicating pregnancy, second trimester: Secondary | ICD-10-CM

## 2022-02-25 DIAGNOSIS — Z794 Long term (current) use of insulin: Secondary | ICD-10-CM | POA: Diagnosis not present

## 2022-02-25 DIAGNOSIS — O09899 Supervision of other high risk pregnancies, unspecified trimester: Secondary | ICD-10-CM

## 2022-02-25 DIAGNOSIS — O24112 Pre-existing diabetes mellitus, type 2, in pregnancy, second trimester: Secondary | ICD-10-CM | POA: Insufficient documentation

## 2022-02-25 DIAGNOSIS — O09212 Supervision of pregnancy with history of pre-term labor, second trimester: Secondary | ICD-10-CM | POA: Diagnosis not present

## 2022-02-25 DIAGNOSIS — C8190 Hodgkin lymphoma, unspecified, unspecified site: Secondary | ICD-10-CM

## 2022-02-25 DIAGNOSIS — O365990 Maternal care for other known or suspected poor fetal growth, unspecified trimester, not applicable or unspecified: Secondary | ICD-10-CM

## 2022-02-25 DIAGNOSIS — E669 Obesity, unspecified: Secondary | ICD-10-CM | POA: Diagnosis not present

## 2022-02-25 DIAGNOSIS — O3432 Maternal care for cervical incompetence, second trimester: Secondary | ICD-10-CM | POA: Insufficient documentation

## 2022-02-25 DIAGNOSIS — Z7984 Long term (current) use of oral hypoglycemic drugs: Secondary | ICD-10-CM | POA: Diagnosis not present

## 2022-02-25 DIAGNOSIS — O99512 Diseases of the respiratory system complicating pregnancy, second trimester: Secondary | ICD-10-CM

## 2022-02-25 DIAGNOSIS — O24912 Unspecified diabetes mellitus in pregnancy, second trimester: Secondary | ICD-10-CM | POA: Diagnosis not present

## 2022-02-25 DIAGNOSIS — Z2839 Other underimmunization status: Secondary | ICD-10-CM | POA: Diagnosis present

## 2022-02-25 DIAGNOSIS — O10912 Unspecified pre-existing hypertension complicating pregnancy, second trimester: Secondary | ICD-10-CM | POA: Diagnosis not present

## 2022-02-25 DIAGNOSIS — Z8571 Personal history of Hodgkin lymphoma: Secondary | ICD-10-CM | POA: Diagnosis not present

## 2022-02-25 DIAGNOSIS — O24119 Pre-existing diabetes mellitus, type 2, in pregnancy, unspecified trimester: Secondary | ICD-10-CM

## 2022-02-25 DIAGNOSIS — Z3A23 23 weeks gestation of pregnancy: Secondary | ICD-10-CM

## 2022-02-25 DIAGNOSIS — O9A112 Malignant neoplasm complicating pregnancy, second trimester: Secondary | ICD-10-CM | POA: Diagnosis present

## 2022-02-25 DIAGNOSIS — O09522 Supervision of elderly multigravida, second trimester: Secondary | ICD-10-CM

## 2022-02-25 DIAGNOSIS — O365920 Maternal care for other known or suspected poor fetal growth, second trimester, not applicable or unspecified: Secondary | ICD-10-CM

## 2022-02-25 DIAGNOSIS — O10012 Pre-existing essential hypertension complicating pregnancy, second trimester: Secondary | ICD-10-CM | POA: Diagnosis not present

## 2022-02-25 DIAGNOSIS — E119 Type 2 diabetes mellitus without complications: Secondary | ICD-10-CM

## 2022-02-25 DIAGNOSIS — O10919 Unspecified pre-existing hypertension complicating pregnancy, unspecified trimester: Secondary | ICD-10-CM | POA: Diagnosis present

## 2022-02-25 DIAGNOSIS — J45909 Unspecified asthma, uncomplicated: Secondary | ICD-10-CM

## 2022-02-25 DIAGNOSIS — Z8759 Personal history of other complications of pregnancy, childbirth and the puerperium: Secondary | ICD-10-CM

## 2022-02-25 NOTE — Progress Notes (Signed)
MFM Note  Bridget Scott was seen due to possible fetal growth restriction.  Her pregnancy has been complicated by chronic hypertension treated with Procardia, pregestational diabetes treated with metformin and insulin, advanced maternal (age 42 years old), history of prior preterm delivery with a cerclage in place, and history of Hodgkin's lymphoma.  During her last exam 3 weeks ago, the overall EFW measured at the 1st percentile.  There is a question if her dating is accurate.  She had a cell free DNA test performed earlier in her pregnancy showing a low risk for trisomy 32, 18, and 13.  A female fetus is predicted.  On today's exam, the EFW measures at less than the 1st percentile for her gestational age indicating severe IUGR.  The fetus only grew 3 ounces over the past 3 weeks.    There was normal amniotic fluid noted.    Vigorous fetal movements were noted throughout today's exam.  Doppler studies of the umbilical arteries showed intermittent absent end-diastolic flow.  There were no signs of reversed end-diastolic flow noted.  The following were discussed during today's consultation:  Severe IUGR  The implications and management of fetal growth restriction was discussed with the patient.    She was advised regarding the increased risk of an adverse pregnancy outcome such as stillbirth associated with severe IUGR, especially if the umbilical artery Doppler studies are abnormal.    Fetal kick count instructions were reviewed today.  She was advised to continue to monitor fetal movements on a daily basis.    She was advised that due to the abnormal umbilical artery Doppler studies noted today, that placental dysfunction is most likely the cause of IUGR.  The increased risk of superimposed preeclampsia due to her underlying medical conditions and placental dysfunction was discussed.  She was advised to increase the dosage of baby aspirin (81 mg each) to 2 tablets/day.  The patient and  her partner were advised that based on today's EFW of 368 g, her fetus is not considered viable yet.   They were reassured that as vigorous fetal movements and normal amniotic fluid were noted on today's exam, her fetus is doing well at this time.  We will continue to follow her with weekly umbilical artery Doppler studies and will continue to assess the amniotic fluid and fetal movements each week.  She understands that should reversed end-diastolic flow be noted later in her pregnancy, that either delivery or hospitalization with daily fetal testing until she reaches a more optimal gestational age for delivery may be recommended.  We will consider increasing the frequency of her ultrasound exams to twice weekly once the fetus is considered viable.  We will also administer a complete course of antenatal corticosteroids at that time.  The couple was advised that the goal for her pregnancy is for her to reach 28 weeks or greater.  She will return to our office in 1 week for an amniotic fluid check and umbilical artery Doppler study.  At the end of the consultation, the patient stated that all of her questions have been answered.   A total of 45 minutes was spent counseling and coordinating the care for this patient.  Greater than 50% of the time was spent in direct face-to-face contact.

## 2022-03-02 ENCOUNTER — Encounter: Payer: Self-pay | Admitting: Obstetrics & Gynecology

## 2022-03-02 DIAGNOSIS — O365920 Maternal care for other known or suspected poor fetal growth, second trimester, not applicable or unspecified: Secondary | ICD-10-CM | POA: Insufficient documentation

## 2022-03-03 ENCOUNTER — Other Ambulatory Visit: Payer: Self-pay

## 2022-03-03 ENCOUNTER — Ambulatory Visit: Payer: Medicaid Other

## 2022-03-03 ENCOUNTER — Other Ambulatory Visit: Payer: Medicaid Other

## 2022-03-03 ENCOUNTER — Ambulatory Visit (INDEPENDENT_AMBULATORY_CARE_PROVIDER_SITE_OTHER): Payer: Medicaid Other | Admitting: Obstetrics and Gynecology

## 2022-03-03 ENCOUNTER — Telehealth: Payer: Self-pay

## 2022-03-03 ENCOUNTER — Ambulatory Visit: Payer: Medicaid Other | Attending: Obstetrics

## 2022-03-03 VITALS — BP 133/83 | HR 69 | Wt 192.0 lb

## 2022-03-03 VITALS — BP 138/79 | HR 67

## 2022-03-03 DIAGNOSIS — J45909 Unspecified asthma, uncomplicated: Secondary | ICD-10-CM | POA: Diagnosis not present

## 2022-03-03 DIAGNOSIS — E119 Type 2 diabetes mellitus without complications: Secondary | ICD-10-CM

## 2022-03-03 DIAGNOSIS — O365990 Maternal care for other known or suspected poor fetal growth, unspecified trimester, not applicable or unspecified: Secondary | ICD-10-CM | POA: Insufficient documentation

## 2022-03-03 DIAGNOSIS — O365920 Maternal care for other known or suspected poor fetal growth, second trimester, not applicable or unspecified: Secondary | ICD-10-CM | POA: Diagnosis not present

## 2022-03-03 DIAGNOSIS — O10912 Unspecified pre-existing hypertension complicating pregnancy, second trimester: Secondary | ICD-10-CM | POA: Diagnosis present

## 2022-03-03 DIAGNOSIS — O24112 Pre-existing diabetes mellitus, type 2, in pregnancy, second trimester: Secondary | ICD-10-CM | POA: Diagnosis not present

## 2022-03-03 DIAGNOSIS — Z7984 Long term (current) use of oral hypoglycemic drugs: Secondary | ICD-10-CM

## 2022-03-03 DIAGNOSIS — Z2839 Other underimmunization status: Secondary | ICD-10-CM | POA: Diagnosis present

## 2022-03-03 DIAGNOSIS — O09899 Supervision of other high risk pregnancies, unspecified trimester: Secondary | ICD-10-CM

## 2022-03-03 DIAGNOSIS — O99512 Diseases of the respiratory system complicating pregnancy, second trimester: Secondary | ICD-10-CM

## 2022-03-03 DIAGNOSIS — O24119 Pre-existing diabetes mellitus, type 2, in pregnancy, unspecified trimester: Secondary | ICD-10-CM

## 2022-03-03 DIAGNOSIS — Z794 Long term (current) use of insulin: Secondary | ICD-10-CM | POA: Diagnosis not present

## 2022-03-03 DIAGNOSIS — O99212 Obesity complicating pregnancy, second trimester: Secondary | ICD-10-CM | POA: Diagnosis present

## 2022-03-03 DIAGNOSIS — O09212 Supervision of pregnancy with history of pre-term labor, second trimester: Secondary | ICD-10-CM

## 2022-03-03 DIAGNOSIS — O10919 Unspecified pre-existing hypertension complicating pregnancy, unspecified trimester: Secondary | ICD-10-CM

## 2022-03-03 DIAGNOSIS — Z8759 Personal history of other complications of pregnancy, childbirth and the puerperium: Secondary | ICD-10-CM | POA: Insufficient documentation

## 2022-03-03 DIAGNOSIS — C8190 Hodgkin lymphoma, unspecified, unspecified site: Secondary | ICD-10-CM

## 2022-03-03 DIAGNOSIS — O3432 Maternal care for cervical incompetence, second trimester: Secondary | ICD-10-CM

## 2022-03-03 DIAGNOSIS — E669 Obesity, unspecified: Secondary | ICD-10-CM

## 2022-03-03 DIAGNOSIS — Z3A24 24 weeks gestation of pregnancy: Secondary | ICD-10-CM

## 2022-03-03 DIAGNOSIS — O09522 Supervision of elderly multigravida, second trimester: Secondary | ICD-10-CM | POA: Diagnosis not present

## 2022-03-03 DIAGNOSIS — O10012 Pre-existing essential hypertension complicating pregnancy, second trimester: Secondary | ICD-10-CM

## 2022-03-03 MED ORDER — INSULIN GLARGINE 100 UNIT/ML ~~LOC~~ SOLN: 13.0000 [IU] | Freq: Every day | SUBCUTANEOUS | 11 refills | Status: DC

## 2022-03-03 NOTE — Progress Notes (Signed)
ROB   Concerned about recent U/S would like to discuss results.

## 2022-03-03 NOTE — Progress Notes (Signed)
PRENATAL VISIT NOTE  Subjective:  Bridget Scott is a 42 y.o. G3P0110 at 66w4dbeing seen today for ongoing prenatal care.  She is currently monitored for the following issues for this high-risk pregnancy and has Hypertension; Diabetes mellitus without complication (HAdelphi; Asthma affecting pregnancy in second trimester; GERD (gastroesophageal reflux disease); Tobacco use disorder; History of trauma; Hodgkin's disease in remission (HChehalis; Major depressive disorder, recurrent episode, severe with anxious distress (HPal[place]; Obesity; Panic attacks; History of preterm premature rupture of membranes (PPROM) at 20 weeks; Supervision of high-risk pregnancy; Advanced maternal age in multigravida; Cervical radiculopathy; Chronic bronchitis (HCarl[place]; Type 2 diabetes mellitus affecting pregnancy, antepartum; Chronic hypertension affecting pregnancy; Rubella non-immune status, antepartum; Cervical cerclage suture present; and Severe IUGR (intrauterine growth restriction) affecting care of mother, second trimester on their problem list.  Patient reports no complaints.  Contractions: Not present. Vag. Bleeding: None.  Movement: Present. Denies leaking of fluid.   The following portions of the patient's history were reviewed and updated as appropriate: allergies, current medications, past family history, past medical history, past social history, past surgical history and problem list.   Objective:   Vitals:   03/03/22 1103  BP: 133/83  Pulse: 69  Weight: 192 lb (87.1 kg)    Fetal Status: Fetal Heart Rate (bpm): 148   Movement: Present     General:  Alert, oriented and cooperative. Patient is in no acute distress.  Skin: Skin is warm and dry. No rash noted.   Cardiovascular: Normal heart rate noted  Respiratory: Normal respiratory effort, no problems with respiration noted  Abdomen: Soft, gravid, appropriate for gestational age.  Pain/Pressure: Absent     Pelvic: Cervical exam deferred        Extremities:  Normal range of motion.  Edema: None  Mental Status: Normal mood and affect. Normal behavior. Normal judgment and thought content.   Assessment and Plan:  Pregnancy: G3P0110 at 246w4d. Chronic hypertension affecting pregnancy Confirms on procardia xl 60 qd and low dose asa  2. Asthma affecting pregnancy in second trimester No issues on no meds  3. Type 2 diabetes mellitus affecting pregnancy, antepartum Pt checking AM fasting and qhs. AM fastings 100s-mid 100s as well as qhs checks. Pt states she is only on metformin 1000 bid and has never been on insulin b/c of pharmacy issues. [facility] and will resend the Lantus 13u qhs. Pt encouraged to do postprandial checks. If still high on insulin, then will d/w AnLevada Dyo see if we can get her on omnipod  4. Cervical cerclage suture present in second trimester Has ppx cerclage since 16wks  5. Severe IUGR (intrauterine growth restriction) affecting care of mother, second trimester Followed by mfm. Stable IAEDF and normal AFI today. 2/1 f/u growth showed <1%, 368g with only 3oz of growth from last u/s, IAEDF, afi wnl Pt to be followed by mfm with qwk dopplers Fetal echo confirmed with pt for 2/12 with [facility]  6. Hodgkin's disease in remission (HHshs St Elizabeth'[facility] HospitalRescheduled maternal echo and OB cardiology appt for early march  Preterm labor symptoms and general obstetric precautions including but not limited to vaginal bleeding, contractions, leaking of fluid and fetal movement were reviewed in detail with the patient. Please refer to After Visit Summary for other counseling recommendations.   No follow-ups on file.  Future Appointments  Date Time Provider De[place]2/14/2024  1:45 PM [facility]St [facility] CenterURSE [facility] LancasterMChrist[facility]Ph[facility]2/14/2024  2:00 PM WMC-MFC US1 [facility]2/21/2024  1:45 PM W[facility]-MFC NURSE [facility]MC[facility]  03/17/2022  2:00 PM WMC-MFC US1 [facility]  03/18/2022  3:30 PM WMC-MFC NURSE WMC-MFC [facility]  03/18/2022  3:45 PM WMC-MFC US7 WMC-MFCUS  [facility]  03/22/2022  8:30 AM WMC-MFC NURSE WMC-MFC [facility]  03/22/2022  8:45 AM WMC-MFC US5 WMC[facility]MFCUS [facility]  03/25/2022  8:30 AM WMC-MFC NURSE WMC-MFC [facility]  03/25/2022  8:45 AM WMC-MFC US4 WMC[facility]US [facility]  03/31/2022  4:10 PM Donnamae Jude, MD [facility]WSCA [facility]  04/09/2022  3:00 PM Berniece Salines, DO [facility]-W[facility] None  04/14/2022  3:30 PM Donnamae Jude, MD [facility]    [name], MD

## 2022-03-03 NOTE — Telephone Encounter (Signed)
TC to [facility] today regarding [facility] for insulin On phone with pharmacist over 20 mins. Per pharmacy Rx was not picked up 12/23/21 and was put back.  Rx was not received 02/01/22.  Insulin Rx resent verbally and copay of $4 confirmed  Pt and spouse made aware Rx was confirmed and will be ready for pick up today Patient and husband voiced understanding.

## 2022-03-04 ENCOUNTER — Telehealth: Payer: Self-pay

## 2022-03-08 ENCOUNTER — Ambulatory Visit: Payer: Medicaid Other | Admitting: *Deleted

## 2022-03-08 ENCOUNTER — Encounter: Payer: Self-pay | Admitting: *Deleted

## 2022-03-08 ENCOUNTER — Ambulatory Visit: Payer: Medicaid Other | Attending: Obstetrics

## 2022-03-08 VITALS — BP 133/84 | HR 77

## 2022-03-08 DIAGNOSIS — O99512 Diseases of the respiratory system complicating pregnancy, second trimester: Secondary | ICD-10-CM

## 2022-03-08 DIAGNOSIS — Z3A25 25 weeks gestation of pregnancy: Secondary | ICD-10-CM | POA: Diagnosis not present

## 2022-03-08 DIAGNOSIS — O365920 Maternal care for other known or suspected poor fetal growth, second trimester, not applicable or unspecified: Secondary | ICD-10-CM | POA: Diagnosis not present

## 2022-03-08 DIAGNOSIS — O09522 Supervision of elderly multigravida, second trimester: Secondary | ICD-10-CM

## 2022-03-08 DIAGNOSIS — O09899 Supervision of other high risk pregnancies, unspecified trimester: Secondary | ICD-10-CM

## 2022-03-08 DIAGNOSIS — E119 Type 2 diabetes mellitus without complications: Secondary | ICD-10-CM | POA: Diagnosis not present

## 2022-03-08 DIAGNOSIS — O09212 Supervision of pregnancy with history of pre-term labor, second trimester: Secondary | ICD-10-CM

## 2022-03-08 DIAGNOSIS — Z7984 Long term (current) use of oral hypoglycemic drugs: Secondary | ICD-10-CM

## 2022-03-08 DIAGNOSIS — O3432 Maternal care for cervical incompetence, second trimester: Secondary | ICD-10-CM | POA: Diagnosis not present

## 2022-03-08 DIAGNOSIS — J45909 Unspecified asthma, uncomplicated: Secondary | ICD-10-CM

## 2022-03-08 DIAGNOSIS — Z2839 Other underimmunization status: Secondary | ICD-10-CM | POA: Insufficient documentation

## 2022-03-08 DIAGNOSIS — O24112 Pre-existing diabetes mellitus, type 2, in pregnancy, second trimester: Secondary | ICD-10-CM

## 2022-03-08 DIAGNOSIS — O24119 Pre-existing diabetes mellitus, type 2, in pregnancy, unspecified trimester: Secondary | ICD-10-CM

## 2022-03-08 DIAGNOSIS — O10012 Pre-existing essential hypertension complicating pregnancy, second trimester: Secondary | ICD-10-CM | POA: Diagnosis not present

## 2022-03-08 DIAGNOSIS — O10919 Unspecified pre-existing hypertension complicating pregnancy, unspecified trimester: Secondary | ICD-10-CM

## 2022-03-08 DIAGNOSIS — Z794 Long term (current) use of insulin: Secondary | ICD-10-CM | POA: Diagnosis not present

## 2022-03-08 DIAGNOSIS — O99212 Obesity complicating pregnancy, second trimester: Secondary | ICD-10-CM

## 2022-03-08 DIAGNOSIS — E669 Obesity, unspecified: Secondary | ICD-10-CM

## 2022-03-10 ENCOUNTER — Ambulatory Visit: Payer: Medicaid Other

## 2022-03-11 ENCOUNTER — Ambulatory Visit: Payer: Medicaid Other | Admitting: *Deleted

## 2022-03-11 ENCOUNTER — Ambulatory Visit: Payer: Medicaid Other | Attending: Obstetrics

## 2022-03-11 VITALS — BP 133/72 | HR 65

## 2022-03-11 DIAGNOSIS — O10919 Unspecified pre-existing hypertension complicating pregnancy, unspecified trimester: Secondary | ICD-10-CM | POA: Insufficient documentation

## 2022-03-11 DIAGNOSIS — O24119 Pre-existing diabetes mellitus, type 2, in pregnancy, unspecified trimester: Secondary | ICD-10-CM

## 2022-03-11 DIAGNOSIS — Z7984 Long term (current) use of oral hypoglycemic drugs: Secondary | ICD-10-CM

## 2022-03-11 DIAGNOSIS — Z2839 Other underimmunization status: Secondary | ICD-10-CM | POA: Diagnosis present

## 2022-03-11 DIAGNOSIS — J45909 Unspecified asthma, uncomplicated: Secondary | ICD-10-CM

## 2022-03-11 DIAGNOSIS — O10012 Pre-existing essential hypertension complicating pregnancy, second trimester: Secondary | ICD-10-CM | POA: Diagnosis not present

## 2022-03-11 DIAGNOSIS — O09899 Supervision of other high risk pregnancies, unspecified trimester: Secondary | ICD-10-CM | POA: Insufficient documentation

## 2022-03-11 DIAGNOSIS — O99512 Diseases of the respiratory system complicating pregnancy, second trimester: Secondary | ICD-10-CM

## 2022-03-11 DIAGNOSIS — Z794 Long term (current) use of insulin: Secondary | ICD-10-CM | POA: Diagnosis not present

## 2022-03-11 DIAGNOSIS — O09212 Supervision of pregnancy with history of pre-term labor, second trimester: Secondary | ICD-10-CM | POA: Diagnosis not present

## 2022-03-11 DIAGNOSIS — O99212 Obesity complicating pregnancy, second trimester: Secondary | ICD-10-CM

## 2022-03-11 DIAGNOSIS — O3432 Maternal care for cervical incompetence, second trimester: Secondary | ICD-10-CM

## 2022-03-11 DIAGNOSIS — E119 Type 2 diabetes mellitus without complications: Secondary | ICD-10-CM

## 2022-03-11 DIAGNOSIS — Z3A25 25 weeks gestation of pregnancy: Secondary | ICD-10-CM | POA: Diagnosis not present

## 2022-03-11 DIAGNOSIS — E669 Obesity, unspecified: Secondary | ICD-10-CM

## 2022-03-11 DIAGNOSIS — O09522 Supervision of elderly multigravida, second trimester: Secondary | ICD-10-CM

## 2022-03-11 DIAGNOSIS — O24112 Pre-existing diabetes mellitus, type 2, in pregnancy, second trimester: Secondary | ICD-10-CM

## 2022-03-11 DIAGNOSIS — O365920 Maternal care for other known or suspected poor fetal growth, second trimester, not applicable or unspecified: Secondary | ICD-10-CM | POA: Diagnosis not present

## 2022-03-15 ENCOUNTER — Ambulatory Visit: Payer: Medicaid Other | Admitting: *Deleted

## 2022-03-15 ENCOUNTER — Ambulatory Visit: Payer: Medicaid Other | Attending: Obstetrics

## 2022-03-15 VITALS — BP 125/72 | HR 87

## 2022-03-15 DIAGNOSIS — O24119 Pre-existing diabetes mellitus, type 2, in pregnancy, unspecified trimester: Secondary | ICD-10-CM

## 2022-03-15 DIAGNOSIS — O10919 Unspecified pre-existing hypertension complicating pregnancy, unspecified trimester: Secondary | ICD-10-CM | POA: Diagnosis present

## 2022-03-15 DIAGNOSIS — O09899 Supervision of other high risk pregnancies, unspecified trimester: Secondary | ICD-10-CM | POA: Diagnosis present

## 2022-03-15 DIAGNOSIS — O09212 Supervision of pregnancy with history of pre-term labor, second trimester: Secondary | ICD-10-CM

## 2022-03-15 DIAGNOSIS — O365920 Maternal care for other known or suspected poor fetal growth, second trimester, not applicable or unspecified: Secondary | ICD-10-CM | POA: Insufficient documentation

## 2022-03-15 DIAGNOSIS — O09522 Supervision of elderly multigravida, second trimester: Secondary | ICD-10-CM

## 2022-03-15 DIAGNOSIS — O99212 Obesity complicating pregnancy, second trimester: Secondary | ICD-10-CM

## 2022-03-15 DIAGNOSIS — Z2839 Other underimmunization status: Secondary | ICD-10-CM

## 2022-03-15 DIAGNOSIS — O24112 Pre-existing diabetes mellitus, type 2, in pregnancy, second trimester: Secondary | ICD-10-CM | POA: Diagnosis not present

## 2022-03-15 DIAGNOSIS — O10012 Pre-existing essential hypertension complicating pregnancy, second trimester: Secondary | ICD-10-CM

## 2022-03-15 DIAGNOSIS — Z794 Long term (current) use of insulin: Secondary | ICD-10-CM | POA: Diagnosis not present

## 2022-03-15 DIAGNOSIS — O3432 Maternal care for cervical incompetence, second trimester: Secondary | ICD-10-CM

## 2022-03-15 DIAGNOSIS — E119 Type 2 diabetes mellitus without complications: Secondary | ICD-10-CM

## 2022-03-15 DIAGNOSIS — J45909 Unspecified asthma, uncomplicated: Secondary | ICD-10-CM

## 2022-03-15 DIAGNOSIS — Z3A26 26 weeks gestation of pregnancy: Secondary | ICD-10-CM

## 2022-03-15 DIAGNOSIS — O99512 Diseases of the respiratory system complicating pregnancy, second trimester: Secondary | ICD-10-CM | POA: Diagnosis not present

## 2022-03-15 DIAGNOSIS — E669 Obesity, unspecified: Secondary | ICD-10-CM

## 2022-03-15 DIAGNOSIS — Z7984 Long term (current) use of oral hypoglycemic drugs: Secondary | ICD-10-CM | POA: Diagnosis not present

## 2022-03-17 ENCOUNTER — Ambulatory Visit: Payer: Medicaid Other

## 2022-03-18 ENCOUNTER — Ambulatory Visit: Payer: Medicaid Other

## 2022-03-18 ENCOUNTER — Ambulatory Visit: Payer: Medicaid Other | Attending: Obstetrics

## 2022-03-18 ENCOUNTER — Ambulatory Visit: Payer: Medicaid Other | Admitting: *Deleted

## 2022-03-18 VITALS — BP 147/84 | HR 69

## 2022-03-18 DIAGNOSIS — E119 Type 2 diabetes mellitus without complications: Secondary | ICD-10-CM

## 2022-03-18 DIAGNOSIS — O24112 Pre-existing diabetes mellitus, type 2, in pregnancy, second trimester: Secondary | ICD-10-CM | POA: Diagnosis not present

## 2022-03-18 DIAGNOSIS — Z794 Long term (current) use of insulin: Secondary | ICD-10-CM

## 2022-03-18 DIAGNOSIS — Z3A26 26 weeks gestation of pregnancy: Secondary | ICD-10-CM | POA: Diagnosis not present

## 2022-03-18 DIAGNOSIS — O99212 Obesity complicating pregnancy, second trimester: Secondary | ICD-10-CM | POA: Insufficient documentation

## 2022-03-18 DIAGNOSIS — Z8759 Personal history of other complications of pregnancy, childbirth and the puerperium: Secondary | ICD-10-CM | POA: Insufficient documentation

## 2022-03-18 DIAGNOSIS — O09899 Supervision of other high risk pregnancies, unspecified trimester: Secondary | ICD-10-CM | POA: Insufficient documentation

## 2022-03-18 DIAGNOSIS — O24119 Pre-existing diabetes mellitus, type 2, in pregnancy, unspecified trimester: Secondary | ICD-10-CM

## 2022-03-18 DIAGNOSIS — O3432 Maternal care for cervical incompetence, second trimester: Secondary | ICD-10-CM | POA: Insufficient documentation

## 2022-03-18 DIAGNOSIS — Z2839 Other underimmunization status: Secondary | ICD-10-CM | POA: Diagnosis present

## 2022-03-18 DIAGNOSIS — O10919 Unspecified pre-existing hypertension complicating pregnancy, unspecified trimester: Secondary | ICD-10-CM

## 2022-03-18 DIAGNOSIS — O365920 Maternal care for other known or suspected poor fetal growth, second trimester, not applicable or unspecified: Secondary | ICD-10-CM | POA: Diagnosis not present

## 2022-03-18 DIAGNOSIS — O09212 Supervision of pregnancy with history of pre-term labor, second trimester: Secondary | ICD-10-CM | POA: Diagnosis not present

## 2022-03-18 DIAGNOSIS — E669 Obesity, unspecified: Secondary | ICD-10-CM

## 2022-03-18 DIAGNOSIS — O99512 Diseases of the respiratory system complicating pregnancy, second trimester: Secondary | ICD-10-CM

## 2022-03-18 DIAGNOSIS — O10012 Pre-existing essential hypertension complicating pregnancy, second trimester: Secondary | ICD-10-CM | POA: Diagnosis not present

## 2022-03-18 DIAGNOSIS — O09522 Supervision of elderly multigravida, second trimester: Secondary | ICD-10-CM | POA: Insufficient documentation

## 2022-03-18 DIAGNOSIS — Z7984 Long term (current) use of oral hypoglycemic drugs: Secondary | ICD-10-CM

## 2022-03-18 DIAGNOSIS — J45909 Unspecified asthma, uncomplicated: Secondary | ICD-10-CM | POA: Diagnosis not present

## 2022-03-18 DIAGNOSIS — O10912 Unspecified pre-existing hypertension complicating pregnancy, second trimester: Secondary | ICD-10-CM | POA: Insufficient documentation

## 2022-03-19 ENCOUNTER — Ambulatory Visit: Payer: Medicaid Other | Attending: Obstetrics and Gynecology | Admitting: *Deleted

## 2022-03-19 DIAGNOSIS — O24112 Pre-existing diabetes mellitus, type 2, in pregnancy, second trimester: Secondary | ICD-10-CM | POA: Diagnosis not present

## 2022-03-19 DIAGNOSIS — O162 Unspecified maternal hypertension, second trimester: Secondary | ICD-10-CM | POA: Diagnosis not present

## 2022-03-19 DIAGNOSIS — Z3A27 27 weeks gestation of pregnancy: Secondary | ICD-10-CM | POA: Diagnosis not present

## 2022-03-19 DIAGNOSIS — O24913 Unspecified diabetes mellitus in pregnancy, third trimester: Secondary | ICD-10-CM | POA: Diagnosis present

## 2022-03-19 MED ORDER — BETAMETHASONE SOD PHOS & ACET 6 (3-3) MG/ML IJ SUSP
12.0000 mg | Freq: Once | INTRAMUSCULAR | Status: AC
  Administered 2022-03-19: 12 mg via INTRAMUSCULAR

## 2022-03-20 ENCOUNTER — Inpatient Hospital Stay (HOSPITAL_COMMUNITY)
Admission: AD | Admit: 2022-03-20 | Discharge: 2022-03-20 | Disposition: A | Discharge status: Home / Self Care | Payer: Medicaid Other | Attending: Obstetrics & Gynecology | Admitting: Obstetrics & Gynecology

## 2022-03-20 DIAGNOSIS — O365920 Maternal care for other known or suspected poor fetal growth, second trimester, not applicable or unspecified: Secondary | ICD-10-CM

## 2022-03-20 DIAGNOSIS — O24112 Pre-existing diabetes mellitus, type 2, in pregnancy, second trimester: Secondary | ICD-10-CM | POA: Diagnosis not present

## 2022-03-20 DIAGNOSIS — Z794 Long term (current) use of insulin: Secondary | ICD-10-CM | POA: Diagnosis not present

## 2022-03-20 DIAGNOSIS — O365930 Maternal care for other known or suspected poor fetal growth, third trimester, not applicable or unspecified: Secondary | ICD-10-CM | POA: Diagnosis not present

## 2022-03-20 DIAGNOSIS — O10919 Unspecified pre-existing hypertension complicating pregnancy, unspecified trimester: Secondary | ICD-10-CM

## 2022-03-20 DIAGNOSIS — O365990 Maternal care for other known or suspected poor fetal growth, unspecified trimester, not applicable or unspecified: Secondary | ICD-10-CM

## 2022-03-20 DIAGNOSIS — O10012 Pre-existing essential hypertension complicating pregnancy, second trimester: Secondary | ICD-10-CM | POA: Diagnosis present

## 2022-03-20 DIAGNOSIS — O24113 Pre-existing diabetes mellitus, type 2, in pregnancy, third trimester: Secondary | ICD-10-CM

## 2022-03-20 DIAGNOSIS — Z3A27 27 weeks gestation of pregnancy: Secondary | ICD-10-CM | POA: Diagnosis not present

## 2022-03-20 MED ORDER — BETAMETHASONE SOD PHOS & ACET 6 (3-3) MG/ML IJ SUSP
12.0000 mg | Freq: Once | INTRAMUSCULAR | Status: AC
  Administered 2022-03-20: 12 mg via INTRAMUSCULAR
  Filled 2022-03-20: qty 5

## 2022-03-20 NOTE — MAU Note (Signed)
B/p elevated 161/101. Pt was nervous about injection . Provider aware. Recheck in 15 min . Decreased to 153/93. Ok to give steroid injection and discharge home

## 2022-03-20 NOTE — MAU Note (Signed)
.  Bridget Scott is a 42 y.o. at 69w0dhere in MAU reporting: for 2nd betamethasone inj. Had first one yesterday in MFM. Being seen for DM and cercliage  and IUGR.  Deneis any pain or cramping good fetal movement felt.  LMP:  Onset of complaint:  Pain score: 0 Vitals:   03/20/22 1433  BP: (!) 161/101  Pulse: 77  Resp: 18  Temp: 98 F (36.7 C)     FHT:137    Lab orders placed from triage:

## 2022-03-20 NOTE — MAU Provider Note (Signed)
[name] is a 42 y.o. G10P0110 pregnant female at 32w0dwho presents to MAU today for 2nd dose of betamethasone. Had first dose at MFM yesterday for severe fetal growth restriction complicated by DM2 and cHTN. Plan is to repeat dose, have doppler studies done on Monday followed by antenatal admission. Pt reports glucose readings have been well-controlled today, fasting was 102.  [facility].  Pertinent items noted in HPI and remainder of comprehensive ROS otherwise negative.   O BP (!) 153/93   Pulse 80   Temp 98 F (36.7 C)   Resp 18   Ht '5\' 5"'$  (1.651 m)   Wt 196 lb (88.9 kg)   LMP 08/28/2021 (Approximate)   BMI 32.62 kg/m  Physical Exam Vitals and nursing note reviewed.  Constitutional:      Appearance: Normal appearance.  HENT:     Head: Normocephalic.  Eyes:     Pupils: Pupils are equal, round, and reactive to light.  Cardiovascular:     Rate and Rhythm: Normal rate and regular rhythm.  Pulmonary:     Effort: Pulmonary effort is normal.  Abdominal:     Palpations: Abdomen is soft.     Tenderness: There is no abdominal tenderness.  Musculoskeletal:        General: Normal range of motion.  Skin:    General: Skin is warm and dry.     Capillary Refill: Capillary refill takes less than 2 seconds.  Neurological:     Mental Status: She is alert and oriented to person, place, and time.  Psychiatric:        Mood and Affect: Mood normal.        Behavior: Behavior normal.    FHR: 138  MDM: Straightforward  MAU Course: Reviewed last MFM note with Dr. EElonda Husky agreed to redose betamethasone. Dose ordered and well tolerated. Strong precautions given, pt stable for discharge.  A & P Fetal growth restriction antepartum - Plan: Discharge patient  Chronic hypertension affecting pregnancy  Type 2 diabetes mellitus affecting pregnancy in third trimester, antepartum  [redacted] weeks gestation of pregnancy  Severe IUGR (intrauterine  growth restriction) affecting care of mother, second trimester   Discharge from MAU in stable condition with precautions Follow up at MFM as scheduled for ongoing prenatal care  Allergies as of 03/20/2022       Reactions   Robitussin Dm Max Day-night Hives, Itching   Other reaction(s): Dizziness, Flushing, Vomiting   Analgesic Balm [trolamine (triethanolamine)] Rash   Bengay Pain Relief [menthol] Rash   Guaifenesin Hives, Rash, Other (See Comments), Nausea And Vomiting   "Made me sick" (Robitussin)   Latex Rash        Medication List     TAKE these medications    Advair HFA 115-21 MCG/ACT inhaler Generic drug: fluticasone-salmeterol Inhale 2 puffs into the lungs 2 (two) times daily.   albuterol 108 (90 Base) MCG/ACT inhaler Commonly known as: VENTOLIN HFA Inhale 1-2 puffs into the lungs every 6 (six) hours as needed for wheezing or shortness of breath.   aspirin EC 81 MG tablet Take 1 tablet (81 mg total) by mouth daily. Start taking when you are [redacted] weeks pregnant for rest of pregnancy for prevention of preeclampsia   benzonatate 100 MG capsule Commonly known as: TESSALON Take 1 capsule (100 mg total) by mouth every 8 (eight) hours.   blood glucose meter kit and supplies Kit Dispense based on patient and insurance preference. Use  up to four times daily as directed. (FOR ICD-9 250.00, 250.01).   busPIRone 5 MG tablet Commonly known as: BUSPAR Take 10 mg by mouth 3 (three) times daily.   Cetirizine HCl 10 MG Caps Take 1 capsule (10 mg total) by mouth daily for 10 days. What changed:  when to take this reasons to take this   famotidine 20 MG tablet Commonly known as: PEPCID Take 1 tablet (20 mg total) by mouth 2 (two) times daily.   folic acid 1 MG tablet Commonly known as: FOLVITE Take 1 tablet (1 mg total) by mouth daily.   hydrOXYzine 25 MG tablet Commonly known as: ATARAX Take 25 mg by mouth 3 (three) times daily as needed for anxiety.   insulin  glargine 100 UNIT/ML injection Commonly known as: Lantus Inject 0.13 mLs (13 Units total) into the skin at bedtime.   Insulin Pen Needle 30G X 8 MM Misc Commonly known as: NOVOFINE Inject 10 each into the skin as needed.   metFORMIN 1000 MG tablet Commonly known as: GLUCOPHAGE Take 1 tablet (1,000 mg total) by mouth 2 (two) times daily with a meal.   nicotine polacrilex 2 MG gum Commonly known as: NICORETTE Take 2 mg by mouth as needed for smoking cessation.   NIFEdipine 60 MG 24 hr tablet Commonly known as: ADALAT CC Take 1 tablet (60 mg total) by mouth daily.   Prenatal 28-0.8 MG Tabs Take 1 tablet by mouth daily.       Gabriel Carina, [place] 03/20/2022 3:02 PM

## 2022-03-22 ENCOUNTER — Ambulatory Visit: Payer: Medicaid Other | Attending: Obstetrics

## 2022-03-22 ENCOUNTER — Ambulatory Visit: Payer: Medicaid Other | Admitting: *Deleted

## 2022-03-22 ENCOUNTER — Other Ambulatory Visit: Payer: Self-pay | Admitting: *Deleted

## 2022-03-22 VITALS — BP 146/88 | HR 67

## 2022-03-22 DIAGNOSIS — O10012 Pre-existing essential hypertension complicating pregnancy, second trimester: Secondary | ICD-10-CM | POA: Diagnosis not present

## 2022-03-22 DIAGNOSIS — E119 Type 2 diabetes mellitus without complications: Secondary | ICD-10-CM | POA: Diagnosis not present

## 2022-03-22 DIAGNOSIS — O09522 Supervision of elderly multigravida, second trimester: Secondary | ICD-10-CM | POA: Diagnosis not present

## 2022-03-22 DIAGNOSIS — O99512 Diseases of the respiratory system complicating pregnancy, second trimester: Secondary | ICD-10-CM | POA: Diagnosis not present

## 2022-03-22 DIAGNOSIS — O24119 Pre-existing diabetes mellitus, type 2, in pregnancy, unspecified trimester: Secondary | ICD-10-CM

## 2022-03-22 DIAGNOSIS — J45909 Unspecified asthma, uncomplicated: Secondary | ICD-10-CM

## 2022-03-22 DIAGNOSIS — O10919 Unspecified pre-existing hypertension complicating pregnancy, unspecified trimester: Secondary | ICD-10-CM

## 2022-03-22 DIAGNOSIS — Z2839 Other underimmunization status: Secondary | ICD-10-CM | POA: Diagnosis present

## 2022-03-22 DIAGNOSIS — Z794 Long term (current) use of insulin: Secondary | ICD-10-CM | POA: Diagnosis not present

## 2022-03-22 DIAGNOSIS — O365920 Maternal care for other known or suspected poor fetal growth, second trimester, not applicable or unspecified: Secondary | ICD-10-CM | POA: Insufficient documentation

## 2022-03-22 DIAGNOSIS — O3432 Maternal care for cervical incompetence, second trimester: Secondary | ICD-10-CM | POA: Diagnosis not present

## 2022-03-22 DIAGNOSIS — E669 Obesity, unspecified: Secondary | ICD-10-CM

## 2022-03-22 DIAGNOSIS — O09212 Supervision of pregnancy with history of pre-term labor, second trimester: Secondary | ICD-10-CM

## 2022-03-22 DIAGNOSIS — Z7984 Long term (current) use of oral hypoglycemic drugs: Secondary | ICD-10-CM | POA: Diagnosis not present

## 2022-03-22 DIAGNOSIS — Z3A27 27 weeks gestation of pregnancy: Secondary | ICD-10-CM | POA: Diagnosis not present

## 2022-03-22 DIAGNOSIS — O09899 Supervision of other high risk pregnancies, unspecified trimester: Secondary | ICD-10-CM | POA: Diagnosis present

## 2022-03-22 DIAGNOSIS — O99212 Obesity complicating pregnancy, second trimester: Secondary | ICD-10-CM

## 2022-03-22 DIAGNOSIS — O24112 Pre-existing diabetes mellitus, type 2, in pregnancy, second trimester: Secondary | ICD-10-CM

## 2022-03-24 ENCOUNTER — Ambulatory Visit: Payer: Medicaid Other | Admitting: *Deleted

## 2022-03-24 ENCOUNTER — Ambulatory Visit: Payer: Medicaid Other | Attending: Obstetrics

## 2022-03-24 ENCOUNTER — Encounter: Payer: Self-pay | Admitting: Family Medicine

## 2022-03-24 ENCOUNTER — Encounter (HOSPITAL_COMMUNITY): Payer: Self-pay | Admitting: Obstetrics and Gynecology

## 2022-03-24 ENCOUNTER — Inpatient Hospital Stay (HOSPITAL_COMMUNITY)
Admission: AD | Admit: 2022-03-24 | Discharge: 2022-03-24 | Discharge status: Against Medical Advice | Payer: Medicaid Other | Attending: Obstetrics and Gynecology | Admitting: Obstetrics and Gynecology

## 2022-03-24 ENCOUNTER — Ambulatory Visit: Payer: Medicaid Other

## 2022-03-24 ENCOUNTER — Ambulatory Visit (HOSPITAL_BASED_OUTPATIENT_CLINIC_OR_DEPARTMENT_OTHER): Payer: Medicaid Other | Admitting: Maternal & Fetal Medicine

## 2022-03-24 VITALS — BP 150/90 | HR 69

## 2022-03-24 DIAGNOSIS — O99512 Diseases of the respiratory system complicating pregnancy, second trimester: Secondary | ICD-10-CM | POA: Diagnosis not present

## 2022-03-24 DIAGNOSIS — Z794 Long term (current) use of insulin: Secondary | ICD-10-CM | POA: Diagnosis not present

## 2022-03-24 DIAGNOSIS — Z8571 Personal history of Hodgkin lymphoma: Secondary | ICD-10-CM | POA: Diagnosis not present

## 2022-03-24 DIAGNOSIS — O10912 Unspecified pre-existing hypertension complicating pregnancy, second trimester: Secondary | ICD-10-CM | POA: Diagnosis not present

## 2022-03-24 DIAGNOSIS — Z7984 Long term (current) use of oral hypoglycemic drugs: Secondary | ICD-10-CM

## 2022-03-24 DIAGNOSIS — O99212 Obesity complicating pregnancy, second trimester: Secondary | ICD-10-CM | POA: Diagnosis present

## 2022-03-24 DIAGNOSIS — O0990 Supervision of high risk pregnancy, unspecified, unspecified trimester: Secondary | ICD-10-CM | POA: Diagnosis not present

## 2022-03-24 DIAGNOSIS — Z8249 Family history of ischemic heart disease and other diseases of the circulatory system: Secondary | ICD-10-CM | POA: Insufficient documentation

## 2022-03-24 DIAGNOSIS — O09899 Supervision of other high risk pregnancies, unspecified trimester: Secondary | ICD-10-CM | POA: Diagnosis present

## 2022-03-24 DIAGNOSIS — O3432 Maternal care for cervical incompetence, second trimester: Secondary | ICD-10-CM | POA: Diagnosis not present

## 2022-03-24 DIAGNOSIS — O43892 Other placental disorders, second trimester: Secondary | ICD-10-CM

## 2022-03-24 DIAGNOSIS — O10913 Unspecified pre-existing hypertension complicating pregnancy, third trimester: Secondary | ICD-10-CM | POA: Diagnosis not present

## 2022-03-24 DIAGNOSIS — O10919 Unspecified pre-existing hypertension complicating pregnancy, unspecified trimester: Secondary | ICD-10-CM

## 2022-03-24 DIAGNOSIS — E669 Obesity, unspecified: Secondary | ICD-10-CM

## 2022-03-24 DIAGNOSIS — O24112 Pre-existing diabetes mellitus, type 2, in pregnancy, second trimester: Secondary | ICD-10-CM | POA: Diagnosis not present

## 2022-03-24 DIAGNOSIS — Z5986 Financial insecurity: Secondary | ICD-10-CM | POA: Insufficient documentation

## 2022-03-24 DIAGNOSIS — O09212 Supervision of pregnancy with history of pre-term labor, second trimester: Secondary | ICD-10-CM

## 2022-03-24 DIAGNOSIS — J45909 Unspecified asthma, uncomplicated: Secondary | ICD-10-CM

## 2022-03-24 DIAGNOSIS — F17210 Nicotine dependence, cigarettes, uncomplicated: Secondary | ICD-10-CM | POA: Diagnosis not present

## 2022-03-24 DIAGNOSIS — O365920 Maternal care for other known or suspected poor fetal growth, second trimester, not applicable or unspecified: Secondary | ICD-10-CM | POA: Diagnosis not present

## 2022-03-24 DIAGNOSIS — Z2839 Other underimmunization status: Secondary | ICD-10-CM | POA: Insufficient documentation

## 2022-03-24 DIAGNOSIS — O365930 Maternal care for other known or suspected poor fetal growth, third trimester, not applicable or unspecified: Secondary | ICD-10-CM | POA: Diagnosis not present

## 2022-03-24 DIAGNOSIS — O24119 Pre-existing diabetes mellitus, type 2, in pregnancy, unspecified trimester: Secondary | ICD-10-CM | POA: Insufficient documentation

## 2022-03-24 DIAGNOSIS — O24113 Pre-existing diabetes mellitus, type 2, in pregnancy, third trimester: Secondary | ICD-10-CM | POA: Diagnosis not present

## 2022-03-24 DIAGNOSIS — Z3A17 17 weeks gestation of pregnancy: Secondary | ICD-10-CM | POA: Diagnosis not present

## 2022-03-24 DIAGNOSIS — O368320 Maternal care for abnormalities of the fetal heart rate or rhythm, second trimester, not applicable or unspecified: Secondary | ICD-10-CM | POA: Insufficient documentation

## 2022-03-24 DIAGNOSIS — O99332 Smoking (tobacco) complicating pregnancy, second trimester: Secondary | ICD-10-CM | POA: Diagnosis not present

## 2022-03-24 DIAGNOSIS — O09522 Supervision of elderly multigravida, second trimester: Secondary | ICD-10-CM | POA: Insufficient documentation

## 2022-03-24 DIAGNOSIS — Z3A27 27 weeks gestation of pregnancy: Secondary | ICD-10-CM | POA: Diagnosis not present

## 2022-03-24 DIAGNOSIS — Z5329 Procedure and treatment not carried out because of patient's decision for other reasons: Secondary | ICD-10-CM | POA: Insufficient documentation

## 2022-03-24 DIAGNOSIS — O0993 Supervision of high risk pregnancy, unspecified, third trimester: Secondary | ICD-10-CM | POA: Diagnosis not present

## 2022-03-24 DIAGNOSIS — O10012 Pre-existing essential hypertension complicating pregnancy, second trimester: Secondary | ICD-10-CM

## 2022-03-24 DIAGNOSIS — O365933 Maternal care for other known or suspected poor fetal growth, third trimester, fetus 3: Secondary | ICD-10-CM

## 2022-03-24 DIAGNOSIS — Z8759 Personal history of other complications of pregnancy, childbirth and the puerperium: Secondary | ICD-10-CM | POA: Insufficient documentation

## 2022-03-24 DIAGNOSIS — E119 Type 2 diabetes mellitus without complications: Secondary | ICD-10-CM | POA: Diagnosis not present

## 2022-03-24 DIAGNOSIS — O09292 Supervision of pregnancy with other poor reproductive or obstetric history, second trimester: Secondary | ICD-10-CM | POA: Insufficient documentation

## 2022-03-24 DIAGNOSIS — Z8751 Personal history of pre-term labor: Secondary | ICD-10-CM | POA: Diagnosis not present

## 2022-03-24 DIAGNOSIS — O368390 Maternal care for abnormalities of the fetal heart rate or rhythm, unspecified trimester, not applicable or unspecified: Secondary | ICD-10-CM

## 2022-03-24 LAB — COMPREHENSIVE METABOLIC PANEL
ALT: 12 U/L (ref 0–44)
AST: 12 U/L — ABNORMAL LOW (ref 15–41)
Albumin: 3 g/dL — ABNORMAL LOW (ref 3.5–5.0)
Alkaline Phosphatase: 64 U/L (ref 38–126)
Anion gap: 12 (ref 5–15)
BUN: 6 mg/dL (ref 6–20)
CO2: 15 mmol/L — ABNORMAL LOW (ref 22–32)
Calcium: 9.1 mg/dL (ref 8.9–10.3)
Chloride: 107 mmol/L (ref 98–111)
Creatinine, Ser: 0.44 mg/dL (ref 0.44–1.00)
GFR, Estimated: >60 mL/min (ref >60)
Glucose, Bld: 136 mg/dL — ABNORMAL HIGH (ref 70–99)
Potassium: 3.5 mmol/L (ref 3.5–5.1)
Sodium: 134 mmol/L — ABNORMAL LOW (ref 135–145)
Total Bilirubin: 0.5 mg/dL (ref 0.3–1.2)
Total Protein: 6.2 g/dL — ABNORMAL LOW (ref 6.5–8.1)

## 2022-03-24 LAB — CBC
HCT: 38.5 % (ref 36.0–46.0)
Hemoglobin: 13.4 g/dL (ref 12.0–15.0)
MCH: 31.2 pg (ref 26.0–34.0)
MCHC: 34.8 g/dL (ref 30.0–36.0)
MCV: 89.7 fL (ref 80.0–100.0)
Platelets: 193 10*3/uL (ref 150–400)
RBC: 4.29 MIL/uL (ref 3.87–5.11)
RDW: 13.3 % (ref 11.5–15.5)
WBC: 17.2 10*3/uL — ABNORMAL HIGH (ref 4.0–10.5)
nRBC: 0 % (ref 0.0–0.2)

## 2022-03-24 NOTE — Progress Notes (Signed)
NST cancelled for today by Dr. Epimenio Sarin.  Pt sent directly to hospital following ultrasound.

## 2022-03-24 NOTE — MAU Provider Note (Signed)
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 back without  significant abnormality. Per my discussion with MFM Dr. Epimenio Sarin prior to her arrival patient should be admitted for monitoring if FHR tracing was not reassuring. The tracing was reassuring until 1804, at which time variability lessened to minimal and she had a run of decelerations (unclear what type as toco was very quiet). At 1819 FHR had a precipitous drop into the 100's before tracing was lost. At 1823 I was notified of concerns regarding the fetal tracing and immediately entered the room with the [place] machine which I had been using across the hall. I immediately found the baby  in a vertex position with what appeared to be a subjectively normal FHR, when we applied the doppler FHR was actually in the 170's and slowly returned to a baseline around 160 with ongoing decelerations.  I immediately discussed with the patient that I recommended admission for monitoring given the non-reassuring fetal heart rate tracing she currently had. Patient and her partner were upset with this recommendation. They felt that we had not been adequately monitoring them while they were here and that in addition they were being told different things by different doctors, that sometimes they are told their baby was fine and at others that they are at high risk for stillbirth. I attempted to calm the situation and explain to them that in this exact moment I had specific concerns because of the tracing. I also told them that while they had indeed been told that their [place] earlier today was "fine", I told them I thought probably what was meant was stable. I reviewed with them that severe IUGR is a significant risk factor for stillbirth and in addition the presence of intermittent absent end diastolic flow is also a compounding risk factor. Given the clinical situation and the poor tracing I strongly recommended they stay for admission. Patient and her partner remained very upset with me and stated "this is ridiculous" and "we should just go to [facility] I again advised against this. Patient then stated she wanted to be discharged immediately. I told her that she was free to make that choice but it was my ethical duty to ensure that she is aware of the risks of leaving which I felt we had not yet discussed in sufficient detail to ensure informed consent. She then began to yell repeatedly "Get out of my room so I can get dressed and leave, get out of my room so I can get dressed and leave" and did not stop until I exited the room. She and her partner then left.   After discharge I reviewed Care Everywhere to see if  patient had presented to [facility] but did not see any new encounters. I sent patient a MyChart message again expressing my concern and strongly recommending she return to any healthcare facility for evaluation and monitoring.   #FWB FHT Cat II   Dispo: left against medical advice    Clarnce Flock, MD/MPH 03/24/22 8:10 PM  Allergies as of 03/24/2022       Reactions   Robitussin Dm Max Day-night Hives, Itching   Other reaction(s): Dizziness, Flushing, Vomiting   Analgesic Balm [trolamine (triethanolamine)] Rash   Bengay Pain Relief [menthol] Rash   Guaifenesin Hives, Rash, Other (See Comments), Nausea And Vomiting   "Made me sick" (Robitussin)   Latex Rash        Medication List     TAKE these medications    Advair HFA 115-21 MCG/ACT inhaler Generic drug: fluticasone-salmeterol Inhale 2 puffs into the  lungs 2 (two) times daily.   albuterol 108 (90 Base) MCG/ACT inhaler Commonly known as: VENTOLIN HFA Inhale 1-2 puffs into the lungs every 6 (six) hours as needed for wheezing or shortness of breath.   aspirin EC 81 MG tablet Take 1 tablet (81 mg total) by mouth daily. Start taking when you are [redacted] weeks pregnant for rest of pregnancy for prevention of preeclampsia   benzonatate 100 MG capsule Commonly known as: TESSALON Take 1 capsule (100 mg total) by mouth every 8 (eight) hours.   blood glucose meter kit and supplies Kit Dispense based on patient and insurance preference. Use up to four times daily as directed. (FOR ICD-9 250.00, 250.01).   busPIRone 5 MG tablet Commonly known as: BUSPAR Take 10 mg by mouth 3 (three) times daily.   Cetirizine HCl 10 MG Caps Take 1 capsule (10 mg total) by mouth daily for 10 days. What changed:  when to take this reasons to take this   famotidine 20 MG tablet Commonly known as: PEPCID Take 1 tablet (20 mg total) by mouth 2 (two) times daily.   folic acid 1 MG tablet Commonly known as: FOLVITE Take 1 tablet (1 mg total) by  mouth daily.   hydrOXYzine 25 MG tablet Commonly known as: ATARAX Take 25 mg by mouth 3 (three) times daily as needed for anxiety.   insulin glargine 100 UNIT/ML injection Commonly known as: Lantus Inject 0.13 mLs (13 Units total) into the skin at bedtime.   Insulin Pen Needle 30G X 8 MM Misc Commonly known as: NOVOFINE Inject 10 each into the skin as needed.   metFORMIN 1000 MG tablet Commonly known as: GLUCOPHAGE Take 1 tablet (1,000 mg total) by mouth 2 (two) times daily with a meal.   nicotine polacrilex 2 MG gum Commonly known as: NICORETTE Take 2 mg by mouth as needed for smoking cessation.   NIFEdipine 60 MG 24 hr tablet Commonly known as: ADALAT CC Take 1 tablet (60 mg total) by mouth daily.   Prenatal 28-0.8 MG Tabs Take 1 tablet by mouth daily.

## 2022-03-24 NOTE — Progress Notes (Signed)
MFM Consult Note Patient Name: Bridget Scott  Patient MRN:   [id number]  Referring provider: Osborne Oman, MD [address] [place],  [place] [id number] Reason for Consult: FGR   HPI: Bridget Scott is a 42 y.o. G3P0110 at 73w4dhere for ultrasound and consultation.   RE FGR: The patient was seen in the office today for fetal growth restriction.  She has been offered admission due to intermittent absent reversed end-diastolic flow on previous ultrasounds but declined due to child care.  Today her blood pressures are elevated in the 160s over 80s with a repeat in the 150s over 90s.  She is asymptomatic from a preeclampsia perspective.  I discussed that if she is able to go to the MAU and has a normal fetal tracing for at least 1 to 2 hours in addition to resolution of her high range blood pressures and normal labs then consideration of discharge can be done.  However, I also discussed that admission to the hospital is very reasonable given the increased risk of stillbirth.  If she is not admitted she should have twice weekly outpatient antenatal testing.  I notified  the MAU provider regarding her arrival.  We discussed at length that there is no strong evidence to say that hospitalization of intermittent absent end-diastolic flow with fetal growth restriction improved outcomes.  I also discussed there is a social and medical cost association with admission to the hospital.  I also discussed that this does allow for more frequent monitoring and may decrease the risk of stillbirth although this is unproven.  I discussed that ultimately I would defer to the patient's comfort level and support her decision.  She is going to make her decision after she is evaluated at the MAU.  Review of Systems: A review of systems was performed and was negative except per HPI   Vitals and Physical Exam BP 160/88 with a repeat of 150/90, HR 69, pregravid BMI: 30.2.  Sitting comfortably on the sonogram table Nonlabored  breathing Normal rate and rhythm Abdomen is nontender  Genetic testing: lwo risk NIPS  Sonographic findings:  Single intrauterine pregnancy. Fetal cardiac activity:  Observed and appears normal. Presentation: Cephalic. Interval fetal anatomy appears normal. Amniotic fluid volume: Subjectively low-norma. MVP: 3.44 cm. Placenta: Posterior. Umbilical artery dopplers findings: Intermittent absent end-diastolic flow.  No reversed flow.  Assessment - FGR, periods of end diastolic flow - Elevated BP - See problem list for other diagnoses not addressed today Plan -Sent to the MAU for preeclampsia rule out and consideration of admission due to end-diastolic flow seen intermittently on ultrasound -She should have 1 to 2 hours of fetal heart tracing -Preeclampsia labs (CBC, CMP, UPC) -Consideration of admission if the blood pressure, laboratory evaluation or fetal heart tracing is abnormal.  I spent 30 minutes reviewing the patients chart, including labs and images as well as counseling the patient about her medical conditions.  BValeda Malm MFM, [place]  03/24/2022  5:25 PM

## 2022-03-24 NOTE — MAU Note (Signed)
...  Bridget Scott is a 42 y.o. at 66w4dhere in MAU reporting: Sent over from MFM for evaluation. She reports she was told she needs an NST and lab work. Denies pain. Denies VB or LOF. +FM.   Severe IUGR. CHTN. DM2. AMA. Cerclage.

## 2022-03-24 NOTE — MAU Note (Addendum)
RN at bedside manually tracing FHR from [phone].  RN at bedside of [place]. Positioned changed and patient to right lateral. Lizandra Zakrzewski, RN attempted to obtain FHR after deceleration. Second RN called to bedside to attempt FHR tracing at 1821 (Flippin, Therapist, sports).  Dr. Dione Plover at bedside upon RN request due to variable and RN inability to obtain FHR after variable. BSUS performed and FHR obtained at 1822.   Patient refuses IV access and fluids at 1825. Reviewed risk of declining IVF. Patient states that they will go to another hospital.   Dr. Dione Plover at bedside speaking to patient and talking about plan of care and that he recommended patient stay for admission. Patient and partner began stating that the other doctor told them that the fetus was stable when they were in the office. Dr. Dione Plover attempted to explain the progression of the FHM strip from the time the patient was put on the monitors to now and how that influenced his decision to keep her. Patient and partner concerned about getting a different opinion and stated they would leave and go to [facility]. Dr. Dione Plover explained the risks and that he strongly recommended they stay for admission. Patient and partner raising their voice and talking over provider stating that they were leaving.   Patient stated that she is leaving AMA at 1833 and told staff to remove her from the monitors. Patient left unit at 1836 prior to signing [place] paperwork.

## 2022-03-25 ENCOUNTER — Ambulatory Visit: Payer: Medicaid Other

## 2022-03-26 ENCOUNTER — Ambulatory Visit: Payer: Medicaid Other | Attending: Obstetrics & Gynecology | Admitting: Obstetrics

## 2022-03-26 ENCOUNTER — Ambulatory Visit: Payer: Medicaid Other

## 2022-03-26 ENCOUNTER — Ambulatory Visit (HOSPITAL_BASED_OUTPATIENT_CLINIC_OR_DEPARTMENT_OTHER): Payer: Medicaid Other

## 2022-03-26 ENCOUNTER — Ambulatory Visit: Payer: Medicaid Other | Admitting: *Deleted

## 2022-03-26 ENCOUNTER — Encounter (HOSPITAL_COMMUNITY): Payer: Self-pay | Admitting: Obstetrics & Gynecology

## 2022-03-26 ENCOUNTER — Inpatient Hospital Stay (HOSPITAL_COMMUNITY)
Admission: AD | Admit: 2022-03-26 | Discharge: 2022-03-28 | DRG: 832 | Disposition: A | Discharge status: Home / Self Care | Payer: Medicaid Other | Attending: Obstetrics & Gynecology | Admitting: Obstetrics & Gynecology

## 2022-03-26 ENCOUNTER — Other Ambulatory Visit: Payer: Self-pay

## 2022-03-26 VITALS — BP 141/82 | HR 88

## 2022-03-26 DIAGNOSIS — J45909 Unspecified asthma, uncomplicated: Secondary | ICD-10-CM | POA: Diagnosis not present

## 2022-03-26 DIAGNOSIS — O24112 Pre-existing diabetes mellitus, type 2, in pregnancy, second trimester: Secondary | ICD-10-CM

## 2022-03-26 DIAGNOSIS — Z3A27 27 weeks gestation of pregnancy: Secondary | ICD-10-CM

## 2022-03-26 DIAGNOSIS — O3432 Maternal care for cervical incompetence, second trimester: Secondary | ICD-10-CM | POA: Diagnosis not present

## 2022-03-26 DIAGNOSIS — O09899 Supervision of other high risk pregnancies, unspecified trimester: Secondary | ICD-10-CM | POA: Insufficient documentation

## 2022-03-26 DIAGNOSIS — Z794 Long term (current) use of insulin: Secondary | ICD-10-CM | POA: Diagnosis not present

## 2022-03-26 DIAGNOSIS — O10013 Pre-existing essential hypertension complicating pregnancy, third trimester: Secondary | ICD-10-CM | POA: Diagnosis not present

## 2022-03-26 DIAGNOSIS — O43892 Other placental disorders, second trimester: Secondary | ICD-10-CM

## 2022-03-26 DIAGNOSIS — O10012 Pre-existing essential hypertension complicating pregnancy, second trimester: Secondary | ICD-10-CM

## 2022-03-26 DIAGNOSIS — O365930 Maternal care for other known or suspected poor fetal growth, third trimester, not applicable or unspecified: Secondary | ICD-10-CM | POA: Diagnosis not present

## 2022-03-26 DIAGNOSIS — O365920 Maternal care for other known or suspected poor fetal growth, second trimester, not applicable or unspecified: Secondary | ICD-10-CM

## 2022-03-26 DIAGNOSIS — O09522 Supervision of elderly multigravida, second trimester: Secondary | ICD-10-CM | POA: Diagnosis not present

## 2022-03-26 DIAGNOSIS — O42913 Preterm premature rupture of membranes, unspecified as to length of time between rupture and onset of labor, third trimester: Secondary | ICD-10-CM | POA: Diagnosis present

## 2022-03-26 DIAGNOSIS — O10919 Unspecified pre-existing hypertension complicating pregnancy, unspecified trimester: Secondary | ICD-10-CM | POA: Insufficient documentation

## 2022-03-26 DIAGNOSIS — O24119 Pre-existing diabetes mellitus, type 2, in pregnancy, unspecified trimester: Secondary | ICD-10-CM

## 2022-03-26 DIAGNOSIS — O09212 Supervision of pregnancy with history of pre-term labor, second trimester: Secondary | ICD-10-CM

## 2022-03-26 DIAGNOSIS — Z7984 Long term (current) use of oral hypoglycemic drugs: Secondary | ICD-10-CM

## 2022-03-26 DIAGNOSIS — O10912 Unspecified pre-existing hypertension complicating pregnancy, second trimester: Secondary | ICD-10-CM | POA: Diagnosis not present

## 2022-03-26 DIAGNOSIS — Z8572 Personal history of non-Hodgkin lymphomas: Secondary | ICD-10-CM

## 2022-03-26 DIAGNOSIS — O09213 Supervision of pregnancy with history of pre-term labor, third trimester: Secondary | ICD-10-CM | POA: Diagnosis not present

## 2022-03-26 DIAGNOSIS — O09513 Supervision of elderly primigravida, third trimester: Secondary | ICD-10-CM | POA: Diagnosis not present

## 2022-03-26 DIAGNOSIS — O10913 Unspecified pre-existing hypertension complicating pregnancy, third trimester: Secondary | ICD-10-CM | POA: Diagnosis not present

## 2022-03-26 DIAGNOSIS — O99212 Obesity complicating pregnancy, second trimester: Secondary | ICD-10-CM

## 2022-03-26 DIAGNOSIS — O09523 Supervision of elderly multigravida, third trimester: Secondary | ICD-10-CM | POA: Diagnosis not present

## 2022-03-26 DIAGNOSIS — E119 Type 2 diabetes mellitus without complications: Secondary | ICD-10-CM

## 2022-03-26 DIAGNOSIS — F17210 Nicotine dependence, cigarettes, uncomplicated: Secondary | ICD-10-CM | POA: Diagnosis present

## 2022-03-26 DIAGNOSIS — Z2839 Other underimmunization status: Secondary | ICD-10-CM | POA: Insufficient documentation

## 2022-03-26 DIAGNOSIS — O99512 Diseases of the respiratory system complicating pregnancy, second trimester: Secondary | ICD-10-CM | POA: Diagnosis not present

## 2022-03-26 DIAGNOSIS — O365990 Maternal care for other known or suspected poor fetal growth, unspecified trimester, not applicable or unspecified: Principal | ICD-10-CM | POA: Diagnosis present

## 2022-03-26 DIAGNOSIS — O43893 Other placental disorders, third trimester: Secondary | ICD-10-CM | POA: Diagnosis not present

## 2022-03-26 DIAGNOSIS — O24113 Pre-existing diabetes mellitus, type 2, in pregnancy, third trimester: Secondary | ICD-10-CM | POA: Diagnosis not present

## 2022-03-26 DIAGNOSIS — O99332 Smoking (tobacco) complicating pregnancy, second trimester: Secondary | ICD-10-CM | POA: Diagnosis present

## 2022-03-26 DIAGNOSIS — E669 Obesity, unspecified: Secondary | ICD-10-CM

## 2022-03-26 DIAGNOSIS — O99513 Diseases of the respiratory system complicating pregnancy, third trimester: Secondary | ICD-10-CM | POA: Diagnosis not present

## 2022-03-26 DIAGNOSIS — Z3A28 28 weeks gestation of pregnancy: Secondary | ICD-10-CM | POA: Diagnosis not present

## 2022-03-26 LAB — CBC
HCT: 40.2 % (ref 36.0–46.0)
Hemoglobin: 13.6 g/dL (ref 12.0–15.0)
MCH: 30.8 pg (ref 26.0–34.0)
MCHC: 33.8 g/dL (ref 30.0–36.0)
MCV: 91.2 fL (ref 80.0–100.0)
Platelets: 194 10*3/uL (ref 150–400)
RBC: 4.41 MIL/uL (ref 3.87–5.11)
RDW: 13.5 % (ref 11.5–15.5)
WBC: 18.7 10*3/uL — ABNORMAL HIGH (ref 4.0–10.5)
nRBC: 0 % (ref 0.0–0.2)

## 2022-03-26 LAB — COMPREHENSIVE METABOLIC PANEL
ALT: 14 U/L (ref 0–44)
AST: 16 U/L (ref 15–41)
Albumin: 3 g/dL — ABNORMAL LOW (ref 3.5–5.0)
Alkaline Phosphatase: 71 U/L (ref 38–126)
Anion gap: 10 (ref 5–15)
BUN: 8 mg/dL (ref 6–20)
CO2: 17 mmol/L — ABNORMAL LOW (ref 22–32)
Calcium: 8.6 mg/dL — ABNORMAL LOW (ref 8.9–10.3)
Chloride: 102 mmol/L (ref 98–111)
Creatinine, Ser: 0.5 mg/dL (ref 0.44–1.00)
GFR, Estimated: >60 mL/min (ref >60)
Glucose, Bld: 323 mg/dL — ABNORMAL HIGH (ref 70–99)
Potassium: 4 mmol/L (ref 3.5–5.1)
Sodium: 129 mmol/L — ABNORMAL LOW (ref 135–145)
Total Bilirubin: 0.5 mg/dL (ref 0.3–1.2)
Total Protein: 6.3 g/dL — ABNORMAL LOW (ref 6.5–8.1)

## 2022-03-26 LAB — GLUCOSE, CAPILLARY: Glucose-Capillary: 277 mg/dL — ABNORMAL HIGH (ref 70–99)

## 2022-03-26 LAB — TYPE AND SCREEN
ABO/RH(D): A POS
Antibody Screen: NEGATIVE

## 2022-03-26 MED ORDER — INSULIN ASPART 100 UNIT/ML IJ SOLN: 0.0000 [IU] | Freq: Three times a day (TID) | INTRAMUSCULAR | Status: DC

## 2022-03-26 MED ORDER — FAMOTIDINE 20 MG PO TABS
20.0000 mg | ORAL_TABLET | Freq: Two times a day (BID) | ORAL | Status: DC
  Administered 2022-03-26 – 2022-03-28 (×4): 20 mg via ORAL
  Filled 2022-03-26 (×4): qty 1

## 2022-03-26 MED ORDER — LABETALOL HCL 5 MG/ML IV SOLN: 40.0000 mg | INTRAVENOUS | Status: DC | PRN

## 2022-03-26 MED ORDER — LACTATED RINGERS IV SOLN: INTRAVENOUS | Status: DC

## 2022-03-26 MED ORDER — CALCIUM CARBONATE ANTACID 500 MG PO CHEW: 2.0000 | CHEWABLE_TABLET | ORAL | Status: DC | PRN

## 2022-03-26 MED ORDER — MAGNESIUM SULFATE 40 GM/1000ML IV SOLN
2.0000 g/h | INTRAVENOUS | Status: AC
  Administered 2022-03-26 – 2022-03-27 (×2): 2 g/h via INTRAVENOUS
  Filled 2022-03-26: qty 1000

## 2022-03-26 MED ORDER — INSULIN GLARGINE-YFGN 100 UNIT/ML ~~LOC~~ SOLN
13.0000 [IU] | Freq: Every day | SUBCUTANEOUS | Status: DC
  Administered 2022-03-26 – 2022-03-27 (×2): 13 [IU] via SUBCUTANEOUS
  Filled 2022-03-26 (×4): qty 0.13

## 2022-03-26 MED ORDER — LABETALOL HCL 5 MG/ML IV SOLN: 20.0000 mg | INTRAVENOUS | Status: DC | PRN

## 2022-03-26 MED ORDER — PRENATAL MULTIVITAMIN CH
1.0000 | ORAL_TABLET | Freq: Every day | ORAL | Status: DC
  Administered 2022-03-26 – 2022-03-27 (×2): 1 via ORAL
  Filled 2022-03-26 (×2): qty 1

## 2022-03-26 MED ORDER — LACTATED RINGERS IV SOLN: 125.0000 mL/h | INTRAVENOUS | Status: AC

## 2022-03-26 MED ORDER — NIFEDIPINE ER OSMOTIC RELEASE 60 MG PO TB24
60.0000 mg | ORAL_TABLET | Freq: Every day | ORAL | Status: DC
  Administered 2022-03-26: 60 mg via ORAL
  Filled 2022-03-26: qty 1

## 2022-03-26 MED ORDER — NIFEDIPINE ER OSMOTIC RELEASE 60 MG PO TB24
90.0000 mg | ORAL_TABLET | Freq: Every day | ORAL | Status: DC
  Administered 2022-03-27 – 2022-03-28 (×2): 90 mg via ORAL
  Filled 2022-03-26 (×2): qty 1

## 2022-03-26 MED ORDER — METFORMIN HCL 500 MG PO TABS
1000.0000 mg | ORAL_TABLET | Freq: Two times a day (BID) | ORAL | Status: DC
  Administered 2022-03-26 – 2022-03-28 (×4): 1000 mg via ORAL
  Filled 2022-03-26 (×4): qty 2

## 2022-03-26 MED ORDER — MAGNESIUM SULFATE BOLUS VIA INFUSION
4.0000 g | Freq: Once | INTRAVENOUS | Status: AC
  Administered 2022-03-26: 4 g via INTRAVENOUS
  Filled 2022-03-26: qty 1000

## 2022-03-26 MED ORDER — MAGNESIUM SULFATE 40 GM/1000ML IV SOLN
INTRAVENOUS | Status: AC
  Filled 2022-03-26: qty 1000

## 2022-03-26 MED ORDER — LABETALOL HCL 5 MG/ML IV SOLN: 80.0000 mg | INTRAVENOUS | Status: DC | PRN

## 2022-03-26 MED ORDER — HYDRALAZINE HCL 20 MG/ML IJ SOLN: 10.0000 mg | INTRAMUSCULAR | Status: DC | PRN

## 2022-03-26 MED ORDER — BETAMETHASONE SOD PHOS & ACET 6 (3-3) MG/ML IJ SUSP
12.0000 mg | INTRAMUSCULAR | Status: AC
  Administered 2022-03-26 – 2022-03-27 (×2): 12 mg via INTRAMUSCULAR
  Filled 2022-03-26: qty 5

## 2022-03-26 MED ORDER — ACETAMINOPHEN 325 MG PO TABS: 650.0000 mg | ORAL_TABLET | ORAL | Status: DC | PRN

## 2022-03-26 NOTE — Progress Notes (Signed)
MFM Note  Bridget Scott has been followed due to severe IUGR.  She is currently at 27 weeks and 6 days.  Her most recent EFW performed 4 days ago was 522 g (1 pound 2 ounces, less than the 1st percentile).  Intermittent absent end-diastolic flow has also been noted on her umbilical artery Doppler studies.  She received a complete course of antenatal corticosteroids last week.  She denies any problems since her last exam and reports feeling fetal movements throughout the day.    Her blood pressures remain mildly elevated 157/81 and 141/82.  Intermittent reversed end-diastolic flow was noted on today's umbilical artery Doppler studies.  There was normal amniotic fluid noted.   Fetal movements were noted throughout today's ultrasound exam.    She had an NST following today's ultrasound exam that did not show any decelerations.  There was minimal variability and  one 10 x 10 accelerations noted.  The increased risk of a fetal demise due to severe IUGR with reversed end-diastolic flow was discussed.  After an extensive discussion, the patient and her husband agreed to be admitted to the hospital later this evening for continuous monitoring.  Once she is admitted, she should be placed on continuous monitoring, receive magnesium sulfate for fetal neuroprotection, and receive a rescue course (2 more doses) of betamethasone.  She should also have a NICU consult.  We will repeat the umbilical artery Doppler studies again in 2 days (on Sunday morning).    Should persistent reversed end-diastolic flow be noted on that exam, delivery will be recommended.    Delivery is recommended at any time should persistent decelerations be noted on her fetal heart rate tracing.  As the fetus is unlikely to tolerate the labor process, she should probably be delivered via C-section.    The patient has stated that she would like a tubal ligation performed at the time of her cesarean delivery.  She has signed all  necessary state consent forms for the tubal ligation during her prenatal visits.    She understands that a tubal ligation is permanent.  She would like the tubal ligation performed regardless of the outcome of her current pregnancy.  She stated that all of her questions were answered today.  A total of 20 minutes was spent counseling and coordinating the care for this patient.  Greater than 50% of the time was spent in direct face-to-face contact.

## 2022-03-26 NOTE — H&P (Signed)
Bridget Scott is a 42 y.o. female presenting for admission for fetal assessment due to severe fetal IUGR with abnormal cord dopplers. Sent by Dr. Annamaria Boots, per his note as follows "Janaeya Lyson has been followed due to severe IUGR.  She is currently at 27 weeks and 6 days.  Her most recent EFW performed 4 days ago was 522 g (1 pound 2 ounces, less than the 1st percentile).  Intermittent absent end-diastolic flow has also been noted on her umbilical artery Doppler studies.  She received a complete course of antenatal corticosteroids last week.   She denies any problems since her last exam and reports feeling fetal movements throughout the day.     Her blood pressures remain mildly elevated 157/81 and 141/82." OB History     Gravida  3   Para  1   Term      Preterm  1   AB  1   Living  0      SAB  1   IAB      Ectopic      Multiple      Live Births  0        Obstetric Comments  G1: PPROM, PTL        Past Medical History:  Diagnosis Date   Asthma    Cancer ([place])    lymphoma   Dental caries    periodontitis, maxillary cyst   Depression    Diabetes mellitus without complication (HCC)    GERD (gastroesophageal reflux disease)    Headache    migraines   Hodgkin lymphoma ([place])    in remission as of 2020. followed by [facility]   Hypertension    Hypertensive urgency    Insomnia 03/21/2019   Pneumonia    as a baby   Seasonal allergies    Past Surgical History:  Procedure Laterality Date   CERVICAL CERCLAGE N/A 12/21/2021   Procedure: CERCLAGE CERVICAL;  Surgeon: Griffin Basil, MD;  Location: [facility] LD ORS;  Service: Gynecology;  Laterality: N/A;   CHOLECYSTECTOMY     LYMPH NODE BIOPSY     PORT-A-CATH REMOVAL     PORTACATH PLACEMENT     TOOTH EXTRACTION Bilateral 05/27/2017   Procedure: MULTIPLE EXTRACTIONS;  Surgeon: Diona Browner, DDS;  Location: [facility];  Service: Oral Surgery;  Laterality: Bilateral;   Family History: family history includes Asthma in her mother;  Cancer in her maternal aunt, maternal uncle, mother, and sister; Cancer - Lung in her mother; Dementia in her maternal grandmother; Diabetes in her mother; Heart attack in her father; Heart disease in her father and mother; Hodgkin's lymphoma in her sister; Hyperlipidemia in her paternal grandmother; Hypertension in her father, maternal grandmother, mother, and paternal grandmother; Lung cancer in her mother; Lung disease in her paternal grandmother. Social History:  reports that she has been smoking cigarettes. She has been smoking an average of .25 packs per day. She has never used smokeless tobacco. She reports that she does not drink alcohol and does not use drugs.     Maternal Diabetes:yes Genetic Screening: Normal Maternal Ultrasounds/Referrals: IUGR Fetal Ultrasounds or other Referrals:  Referred to Materal Fetal Medicine , Other: cord dopplers Maternal Substance Abuse:  No Significant Maternal Medications:  Meds include: Other:  Current Facility-Administered Medications:    acetaminophen (TYLENOL) tablet 650 mg, 650 mg, Oral, Q4H PRN, Woodroe Mode, MD   calcium carbonate (TUMS - dosed in mg elemental calcium) chewable tablet 400 mg of elemental calcium, 2  tablet, Oral, Q4H PRN, Woodroe Mode, MD   [START ON 03/27/2022] lactated ringers infusion, 125 mL/hr, Intravenous, On Call to OR, Woodroe Mode, MD   [START ON 03/27/2022] prenatal multivitamin tablet 1 tablet, 1 tablet, Oral, Q1200, Woodroe Mode, MD Insulin Procardia, metformin, Zoloft, Buspar Significant Maternal Lab Results:  None Number of Prenatal Visits:greater than 3 verified prenatal visits Other Comments:   IUGR  Review of Systems History   Last menstrual period 08/28/2021. Maternal Exam:  Abdomen: Patient reports no abdominal tenderness. Introitus: not evaluated.   Cervix: not evaluated.   Physical Exam Vitals and nursing note reviewed. Exam conducted with a chaperone present.  Constitutional:       Appearance: Normal appearance.  HENT:     Head: Normocephalic and atraumatic.  Cardiovascular:     Rate and Rhythm: Normal rate.  Pulmonary:     Effort: Pulmonary effort is normal.  Neurological:     Mental Status: She is alert.  Psychiatric:        Mood and Affect: Mood normal.        Behavior: Behavior normal.        Thought Content: Thought content normal.     Prenatal labs: ABO, Rh: A/Positive/-- (11/29 1543) Antibody: Negative (11/29 1543) Rubella: <0.90 (11/29 1543) RPR: Non Reactive (11/29 1543)  HBsAg: Negative (11/29 1543)  HIV: Non Reactive (11/29 1543)  GBS:     Assessment/Plan: 41w6dG3P0110 Severe IUGR with reverse flow on doppler study today  The increased risk of a fetal demise due to severe IUGR with reversed end-diastolic flow was discussed.   After an extensive discussion, the patient and her husband agreed to be admitted to the hospital later this evening for continuous monitoring.   Once she is admitted, she should be placed on continuous monitoring, receive magnesium sulfate for fetal neuroprotection, and receive a rescue course (2 more doses) of betamethasone.   She should also have a NICU consult.   We will repeat the umbilical artery Doppler studies again in 2 days (on Sunday morning).     Should persistent reversed end-diastolic flow be noted on that exam, delivery will be recommended.     Delivery is recommended at any time should persistent decelerations be noted on her fetal heart rate tracing.   As the fetus is unlikely to tolerate the labor process, she should probably be delivered via C-section.     The patient has stated that she would like a tubal ligation performed at the time of her cesarean delivery.  She has signed all necessary state consent forms for the tubal ligation during her prenatal visits.     She understands that a tubal ligation is permanent.  She would like the tubal ligation performed regardless of the outcome of her  current pregnancy.    JEmeterio Reeve3/01/2022, 7:22 PM

## 2022-03-26 NOTE — Procedures (Signed)
KATIANA CAPPIELLO 09-19-1980 [redacted]w[redacted]d Fetus A Non-Stress Test Interpretation for 03/26/22  Indication: IUGR, Chronic Hypertenstion, and Diabetes  Fetal Heart Rate A Mode: External Baseline Rate (A): 160 bpm Variability: Minimal Accelerations: None Decelerations: None Multiple birth?: No  Uterine Activity Mode: Palpation, Toco Contraction Frequency (min): no UC Resting Tone Palpated: Relaxed Resting Time: Adequate  Interpretation (Fetal Testing) Nonstress Test Interpretation: Non-reactive Overall Impression: Non-reassuring Comments: tracing reviewed by Dr FAnnamaria Boots Pt sent to MAU for admission for delivery.  SColletta [place] RNC

## 2022-03-26 NOTE — Consult Note (Signed)
.  Consultation Service: Neonatology   Dr. Nelda Marseille has asked for consultation on Bridget Scott regarding the care of a premature infant at 28+6 wga. Thank you for inviting [place] to see this patient.   Reason for consult:  Explain the possible complications, the prognosis, and the care of a premature infant at 65 and 6/7 weeks.  Chief complaint: 42 y.o. female with a single viable IUP with an estimated weight of 522 grams (4 days ago). Plan is for delivery via ceasarean section or vaginal delivery if possible if evidence of fetal distress on continuous fetal monitoring.  My key findings of this patient's HPI are:  I have reviewed the patient's chart and have met with her. 42 yo female currently pregnant @ 27+6 wga, admitted due to severe IUGR <1% and intermittent AEDF--> now with intermittent reversed end diastolic flow on [id number].  Pregnancy otherwise c/b cHTN, Type 2 IDDM, AMA, PPROM s/p cerclage placement this pregnancy, Hodgkin's in remission.  She has been followed by MFM due to [facility] and IUGR.  She received a complete course of antenatal corticosteroids last week.  NST on 3/1 was acceptable, with normal amniotic fluid.  Due to significantly increased risk of a fetal demise due to severe IUGR with reversed end-diastolic flow, admitted for continuous monitoring on 03/26/22.  Mag for fetal neuroprotection was initiated, and rescue course of BMZ started. Plan to repeat the umbilical artery Doppler studies again in 2 days on 3/3 am.  If persistent reversed end diastolic flow noted at that time, delivery will be recommended per MFM.  In the interim,  delivery is recommended at any time should persistent decelerations be noted on her fetal heart rate tracing.  Additionally per MFM recs, as the fetus is unlikely to tolerate the labor process, she will probably be delivered via C-section.      Prenatal labs: reviewed, HIV neg, Hep B neg, HCV neg, GBS pending 3/1.    Prenatal care:   good Pregnancy  complications:  See above.  Maternal antibiotics: This patient's mother is not on file. Maternal Steroids: BMZ complete last week Most recent dose:  Rescue BMZ given 03/26/22    My recommendations for this patient and my actions included:   1. In the presence of the Bridget Scott and Father of baby Bridget Scott (husband), I spent 20 minutes discussing the possible complications and outcomes of prematurity at this gestational age. I discussed specific complications at this gestational age referencing the need for resuscitation at birth, mechanical ventilation and surfactant administration for respiratory distress, IV fluids pending establishment of enteral feeds (encouraged breast milk feeding), antibiotics for possible sepsis, temperature support, and monitoring. I also discussed the potential risk of complications such as intracranial hemorrhage, somewhat higher given severe IUGR, retinopathy, hearing deficit, and chronic lung disease. I discussed this with parents in detail and they expressed an understanding of the risks and complications of prematurity.   2. I also discussed the expected survival of an infant born at 27+6 wk to [redacted] wks gestation, which is good. We further discussed that some of the neonates born at this age have profound or severe neurological complications and school difficulties, all risks somewhat higher in setting of severe IUGR and EFW 522 g <1%ile.  Discussed that many of the neonates born at this age will have some for of mild to moderate neurological complications. They expressed an understanding of this information.   3. I informed her that the NICU team would be present at  the delivery. Both parents agreed that all appropriate medical measures could be taken to resuscitate her infant at the delivery. They understood that depending on their infant's initial condition and NICU course, some difficult decisions may have to be made. The baby's name is Bridget Scott.  4. A visit to  the NICU by the infant's mother and/or a significant other was encouraged. Visitation policy was discussed.   Final Impression:  42 y.o. female with a single IUP at high risk for preterm delivery of severely growth restricted infant at 48+6 wga.  Both parents now understand the possible complications and prognosis of her infant. The mother agrees with plan for resuscitation and ICU care. Bridget Scott's and Bridget Scott questions were answered. MOB is planning to try and provide breast milk for her infant.    ______________________________________________________________________  Thank you for asking [place] to participate in the care of this patient. Please do not hesitate to contact us again if you are aware of any further ways we can be of assistance.   Sincerely,  Denna Haggard, MD Attending Neonatologist   I spent ~40 minutes in consultation time, of which 25 minutes was spent in direct face to face counseling.

## 2022-03-27 ENCOUNTER — Encounter (HOSPITAL_COMMUNITY): Payer: Self-pay | Admitting: Obstetrics & Gynecology

## 2022-03-27 DIAGNOSIS — O365990 Maternal care for other known or suspected poor fetal growth, unspecified trimester, not applicable or unspecified: Principal | ICD-10-CM | POA: Diagnosis present

## 2022-03-27 LAB — URINALYSIS, ROUTINE W REFLEX MICROSCOPIC
Bacteria, UA: NONE SEEN
Bilirubin Urine: NEGATIVE
Glucose, UA: >=500 mg/dL — AB
Hgb urine dipstick: NEGATIVE
Ketones, ur: 20 mg/dL — AB
Leukocytes,Ua: NEGATIVE
Nitrite: NEGATIVE
Protein, ur: NEGATIVE mg/dL
Specific Gravity, Urine: 1.023 (ref 1.005–1.030)
pH: 6 (ref 5.0–8.0)

## 2022-03-27 LAB — GLUCOSE, CAPILLARY
Glucose-Capillary: 315 mg/dL — ABNORMAL HIGH (ref 70–99)
Glucose-Capillary: 320 mg/dL — ABNORMAL HIGH (ref 70–99)
Glucose-Capillary: 339 mg/dL — ABNORMAL HIGH (ref 70–99)
Glucose-Capillary: 366 mg/dL — ABNORMAL HIGH (ref 70–99)
Glucose-Capillary: 370 mg/dL — ABNORMAL HIGH (ref 70–99)

## 2022-03-27 LAB — PROTEIN / CREATININE RATIO, URINE
Creatinine, Urine: 26 mg/dL
Protein Creatinine Ratio: 0.35 mg/mg{Cre} — ABNORMAL HIGH (ref 0.00–0.15)
Total Protein, Urine: 9 mg/dL

## 2022-03-27 MED ORDER — INSULIN ASPART 100 UNIT/ML IJ SOLN: 0.0000 [IU] | Freq: Three times a day (TID) | INTRAMUSCULAR | Status: DC

## 2022-03-27 MED ORDER — INSULIN ASPART 100 UNIT/ML IJ SOLN: 0.0000 [IU] | Freq: Every day | INTRAMUSCULAR | Status: DC

## 2022-03-27 MED ORDER — INSULIN ASPART 100 UNIT/ML IJ SOLN
0.0000 [IU] | Freq: Every day | INTRAMUSCULAR | Status: DC
  Administered 2022-03-27: 11 [IU] via SUBCUTANEOUS
  Administered 2022-03-27: 15 [IU] via SUBCUTANEOUS
  Administered 2022-03-27: 11 [IU] via SUBCUTANEOUS
  Administered 2022-03-27: 15 [IU] via SUBCUTANEOUS
  Administered 2022-03-27 – 2022-03-28 (×2): 11 [IU] via SUBCUTANEOUS

## 2022-03-27 NOTE — Plan of Care (Signed)
  Problem: Education: Goal: Knowledge of disease or condition will improve Outcome: Progressing Goal: Knowledge of the prescribed therapeutic regimen will improve Outcome: Progressing Goal: Individualized Educational Video(s) Outcome: Progressing   Problem: Clinical Measurements: Goal: Complications related to the disease process, condition or treatment will be avoided or minimized Outcome: Progressing   Problem: Education: Goal: Knowledge of General Education information will improve Description: Including pain rating scale, medication(s)/side effects and non-pharmacologic comfort measures Outcome: Progressing   Problem: Health Behavior/Discharge Planning: Goal: Ability to manage health-related needs will improve Outcome: Progressing   Problem: Clinical Measurements: Goal: Ability to maintain clinical measurements within normal limits will improve Outcome: Progressing Goal: Will remain free from infection Outcome: Progressing Goal: Diagnostic test results will improve Outcome: Progressing Goal: Respiratory complications will improve Outcome: Progressing Goal: Cardiovascular complication will be avoided Outcome: Progressing   Problem: Activity: Goal: Risk for activity intolerance will decrease Outcome: Progressing   Problem: Nutrition: Goal: Adequate nutrition will be maintained Outcome: Progressing   Problem: Coping: Goal: Level of anxiety will decrease Outcome: Progressing   Problem: Elimination: Goal: Will not experience complications related to bowel motility Outcome: Progressing Goal: Will not experience complications related to urinary retention Outcome: Progressing   Problem: Pain Managment: Goal: General experience of comfort will improve Outcome: Progressing   Problem: Safety: Goal: Ability to remain free from injury will improve Outcome: Progressing   Problem: Skin Integrity: Goal: Risk for impaired skin integrity will decrease Outcome:  Progressing   Problem: Education: Goal: Knowledge of disease or condition will improve Outcome: Progressing Goal: Knowledge of the prescribed therapeutic regimen will improve Outcome: Progressing   Problem: Fluid Volume: Goal: Peripheral tissue perfusion will improve Outcome: Progressing   Problem: Clinical Measurements: Goal: Complications related to disease process, condition or treatment will be avoided or minimized Outcome: Progressing   Problem: Education: Goal: Ability to describe self-care measures that may prevent or decrease complications (Diabetes Survival Skills Education) will improve Outcome: Progressing Goal: Individualized Educational Video(s) Outcome: Progressing   Problem: Coping: Goal: Ability to adjust to condition or change in health will improve Outcome: Progressing   Problem: Fluid Volume: Goal: Ability to maintain a balanced intake and output will improve Outcome: Progressing   Problem: Health Behavior/Discharge Planning: Goal: Ability to identify and utilize available resources and services will improve Outcome: Progressing Goal: Ability to manage health-related needs will improve Outcome: Progressing   Problem: Metabolic: Goal: Ability to maintain appropriate glucose levels will improve Outcome: Progressing   Problem: Nutritional: Goal: Maintenance of adequate nutrition will improve Outcome: Progressing Goal: Progress toward achieving an optimal weight will improve Outcome: Progressing   Problem: Skin Integrity: Goal: Risk for impaired skin integrity will decrease Outcome: Progressing   Problem: Tissue Perfusion: Goal: Adequacy of tissue perfusion will improve Outcome: Progressing

## 2022-03-27 NOTE — Progress Notes (Signed)
Patient ID: Bridget Scott, female   DOB: 27-Aug-1980, 42 y.o.   MRN: [id number] [place]) NOTE  Bridget Scott is a 41 y.o. G3P0110 with Estimated Date of Delivery: 06/19/22   By   [redacted]w[redacted]d who is admitted for FGR with IR UAD.    Fetal presentation is unsure. Length of Stay:  1  Days  Date of admission:03/26/2022  Subjective:  Patient reports the fetal movement as active. Patient reports uterine contraction  activity as none. Patient reports  vaginal bleeding as none. Patient describes fluid per vagina as None.  Vitals:  Blood pressure (!) 140/89, pulse 84, temperature 98.1 F (36.7 C), temperature source Oral, resp. rate 16, last menstrual period 08/28/2021, SpO2 98 %. Vitals:   03/27/22 0517 03/27/22 0800 03/27/22 0843 03/27/22 0844  BP: (!) 143/83  (!) 140/89   Pulse: 81  84   Resp: '18 17 16   '$ Temp: 98.2 F (36.8 C)  98.1 F (36.7 C)   TempSrc: Oral  Oral   SpO2: 97%  99% 98%   Physical Examination:  General appearance - alert, well appearing, and in no distress Abdomen - soft, nontender, nondistended, no masses or organomegaly Fundal Height:  size less than dates Pelvic Exam:  examination not indicated Cervical Exam:  Extremities: extremities normal, atraumatic, no cyanosis or edema with DTRs 2+ bilaterally Membranes:intact  Fetal Monitoring:  Baseline: 150 bpm, Variability: Fair (1-6 bpm), Accelerations: Non-reactive but appropriate for gestational age, and Decelerations: Absent   equivocal  Labs:  Results for orders placed or performed during the hospital encounter of 03/26/22 (from the past 24 hour(s))  Glucose, capillary   Collection Time: 03/26/22 10:03 PM  Result Value Ref Range   Glucose-Capillary 277 (H) 70 - 99 mg/dL   Comment 1 Notify RN   Type and screen MWelda  Collection Time: 03/26/22 10:54 PM  Result Value Ref Range   ABO/RH(D) A POS    Antibody Screen NEG    Sample Expiration       03/29/2022,2359 Performed at [facility] [address], [place] [address]  Comprehensive metabolic panel   Collection Time: 03/26/22 10:58 PM  Result Value Ref Range   Sodium 129 (L) 135 - 145 mmol/L   Potassium 4.0 3.5 - 5.1 mmol/L   Chloride 102 98 - 111 mmol/L   CO2 17 (L) 22 - 32 mmol/L   Glucose, Bld 323 (H) 70 - 99 mg/dL   BUN 8 6 - 20 mg/dL   Creatinine, Ser 0.50 0.44 - 1.00 mg/dL   Calcium 8.6 (L) 8.9 - 10.3 mg/dL   Total Protein 6.3 (L) 6.5 - 8.1 g/dL   Albumin 3.0 (L) 3.5 - 5.0 g/dL   AST 16 15 - 41 U/L   ALT 14 0 - 44 U/L   Alkaline Phosphatase 71 38 - 126 U/L   Total Bilirubin 0.5 0.3 - 1.2 mg/dL   GFR, Estimated >60 >60 mL/min   Anion gap 10 5 - 15  CBC   Collection Time: 03/26/22 10:58 PM  Result Value Ref Range   WBC 18.7 (H) 4.0 - 10.5 K/uL   RBC 4.41 3.87 - 5.11 MIL/uL   Hemoglobin 13.6 12.0 - 15.0 g/dL   HCT 40.2 36.0 - 46.0 %   MCV 91.2 80.0 - 100.0 fL   MCH 30.8 26.0 - 34.0 pg   MCHC 33.8 30.0 - 36.0 g/dL   RDW 13.5 11.5 - 15.5 %  Platelets 194 150 - 400 K/uL   nRBC 0.0 0.0 - 0.2 %  Urinalysis, Routine w reflex microscopic -Urine, Clean Catch   Collection Time: 03/27/22 12:00 AM  Result Value Ref Range   Color, Urine YELLOW YELLOW   APPearance CLEAR CLEAR   Specific Gravity, Urine 1.023 1.005 - 1.030   pH 6.0 5.0 - 8.0   Glucose, UA >=500 (A) NEGATIVE mg/dL   Hgb urine dipstick NEGATIVE NEGATIVE   Bilirubin Urine NEGATIVE NEGATIVE   Ketones, ur 20 (A) NEGATIVE mg/dL   Protein, ur NEGATIVE NEGATIVE mg/dL   Nitrite NEGATIVE NEGATIVE   Leukocytes,Ua NEGATIVE NEGATIVE   RBC / HPF 0-5 0 - 5 RBC/hpf   WBC, UA 0-5 0 - 5 WBC/hpf   Bacteria, UA NONE SEEN NONE SEEN   Squamous Epithelial / HPF 0-5 0 - 5 /HPF  Protein / creatinine ratio, urine   Collection Time: 03/27/22 12:00 AM  Result Value Ref Range   Creatinine, Urine 26 mg/dL   Total Protein, Urine 9 mg/dL   Protein Creatinine Ratio 0.35 (H) 0.00 - 0.15 mg/mg[Cre]  Glucose,  capillary   Collection Time: 03/27/22  5:15 AM  Result Value Ref Range   Glucose-Capillary 320 (H) 70 - 99 mg/dL   Comment 1 Notify RN   Glucose, capillary   Collection Time: 03/27/22 11:15 AM  Result Value Ref Range   Glucose-Capillary 366 (H) 70 - 99 mg/dL    Imaging Studies:    [place] MFM UA CORD DOPPLER  Result Date: 03/26/2022 ----------------------------------------------------------------------  OBSTETRICS REPORT                       (Signed Final 03/26/2022 05:32 pm) ---------------------------------------------------------------------- Patient Info  ID #:       [id number]                          D.O.B.:  Jun 27, 1980 (41 yrs)  Name:       Bridget Scott                Visit Date: 03/26/2022 01:17 pm ---------------------------------------------------------------------- Performed By  Attending:        Johnell Comings MD         Ref. Address:     [place]  Performed By:     Jacob Moores BS,       [facility]                    RDMS, RVT                                [facility]                                                             [facility]  Referred By:      [facility] ---------------------------------------------------------------------- Orders  #  Description                           Code        Ordered By  1  [place] MFM UA CORD DOPPLER                [phone]    Peterson Ao  2  [place] MFM OB LIMITED                     [id number]    Bridget FANG ----------------------------------------------------------------------  #  Order #                     Accession #                Episode #  1  [id number]                   [id number]                 [id number]  2  [id number]                   [id number]                 [id number] ---------------------------------------------------------------------- Indications  Maternal care for known or suspected  poor      O36.5920  fetal growth, second trimester, not applicable  or unspecified IUGR  Absent end diastolic flow (Fetal and           O36.90X0  placental problem in pregnancy)  Advanced maternal age multigravida 90+,        O64.522  second trimester (31 yrs)  Hypertension - Chronic/Pre-existing            O10.019  (Procardia)  Pre-existing diabetes, type 2, in pregnancy,   O24.112  second trimester (metformin, insulin)  [redacted] weeks gestation of pregnancy                [id number]  Obesity complicating pregnancy, second         O99.212  trimester (Pre-G BMI 30.3)  Cervical cerclage suture present, second       O34.32  trimester (12/21/21)  Poor obstetric history: Previous preterm       O09.219  delivery, antepartum (PPROM, 20wks)  Asthma                                         O99.89 j45.909  LR NIPS/AFP Neg/Horizon Neg ---------------------------------------------------------------------- Fetal Evaluation  Num Of Fetuses:         1  Fetal Heart Rate(bpm):  146  Cardiac Activity:       Observed  Presentation:           Cephalic  Placenta:               Posterior  P. Cord Insertion:  Previously visualized  Amniotic Fluid  AFI FV:      Within normal limits                              Largest Pocket(cm)                              3.22 ---------------------------------------------------------------------- OB History  Gravidity:    3         Term:   0        Prem:   1        SAB:   1  TOP:          0       Ectopic:  0        Living: 1 ---------------------------------------------------------------------- Gestational Age  LMP:           30w 0d        Date:  08/28/21                   EDD:   06/04/22  Best:          27w 6d     Det. By:  U/S  (12/23/21)          EDD:   06/19/22 ---------------------------------------------------------------------- Anatomy  Cranium:               Appears normal         LVOT:                   Previously seen  Cavum:                 Previously             Aortic Arch:            Previously seen                          visualized  Ventricles:            Appears normal         Ductal Arch:            Previously seen  Choroid Plexus:        Previously seen        Diaphragm:              Appears normal  Cerebellum:            Previously seen        Stomach:                Appears normal, left                                                                        sided  Posterior Fossa:       Previously seen        Abdomen:                Previously seen  Nuchal Fold:           Previously seen  Abdominal Wall:         Previously seen  Face:                  Orbits and profile     Cord Vessels:           Previously seen                         previously seen  Lips:                  Previously seen        Kidneys:                Appear normal  Palate:                Previously             Bladder:                Appears normal                         visualized  Thoracic:              Appears normal         Spine:                  Not well visualized                                                                        (Trv views normal)  Heart:                 Appears normal         Upper Extremities:      Previously seen                         (4CH, axis, and                         situs)  RVOT:                  Previously seen        Lower Extremities:      Previously seen  Other:  Female gender, Nasal bone, lenses, Heels/feet, open hands/5th digits          prev visualized. Technically difficult due to maternal habitus and fetal          position. ---------------------------------------------------------------------- Doppler - Fetal Vessels  Umbilical Artery                                                              ADFV    RDFV  Yes     Yes ---------------------------------------------------------------------- Cervix Uterus Adnexa  Cervix  Not visualized (advanced GA >24wks)  Uterus  No abnormality visualized.  Right Ovary  Within normal  limits.  Left Ovary  Within normal limits.  Cul De Sac  No free fluid seen.  Adnexa  No adnexal mass visualized ---------------------------------------------------------------------- Comments  Bridget Scott has been followed due to severe IUGR.  She  is currently at 27 weeks and 6 days.  Her most recent EFW  performed 4 days ago was 522 g (1 pound 2 ounces, less  than the 1st percentile).  Intermittent absent end-diastolic flow  has also been noted on her umbilical artery Doppler studies.  She received a complete course of antenatal corticosteroids  last week.  She denies any problems since her last exam and reports  feeling fetal movements throughout the day.  Her blood pressures remain mildly elevated 157/81 and  141/82.  Intermittent reversed end-diastolic flow was noted on today's  umbilical artery Doppler studies.  There was normal amniotic  fluid noted.  Fetal movements were noted throughout today's ultrasound  exam.  She had an NST following today's ultrasound exam that did  not show any decelerations.  There was minimal variability  and  one 10 x 10 accelerations noted.  The increased risk of a fetal demise due to severe IUGR with  reversed end-diastolic flow was discussed.  After an extensive discussion, the patient and her husband  agreed to be admitted to the hospital later this evening for  continuous monitoring.  Once she is admitted, she should be placed on continuous  monitoring, receive magnesium sulfate for fetal  neuroprotection, and receive a rescue course (2 more doses)  of betamethasone.  She should also have a NICU consult.  We will repeat the umbilical artery Doppler studies again in 2  days (on 'Sunday morning).  Should persistent reversed end-diastolic flow be noted on  that exam, delivery will be recommended.  Delivery is recommended at any time should persistent  decelerations be noted on her fetal heart rate tracing.  As the fetus is unlikely to tolerate the labor process, she  should  probably be delivered via C-section.  The patient has stated that she would like a tubal ligation  performed at the time of her cesarean delivery.  She has  signed all necessary state consent forms for the tubal ligation  during her prenatal visits.  She understands that a tubal ligation is permanent.  She  would like the tubal ligation performed regardless of the  outcome of her current pregnancy.  She stated that all of her questions were answered today.  A total of 20 minutes was spent counseling and coordinating  the care for this patient.  Greater than 50% of the time was  spent in direct face-to-face contact. ----------------------------------------------------------------------                  Victor Fang, MD Electronically Signed Final Report   03/26/2022 05:32 pm ----------------------------------------------------------------------  US MFM OB LIMITED  Result Date: 03/26/2022 ----------------------------------------------------------------------  OBSTETRICS REPORT                       (Signed Final 03/26/2022 05:32 pm) ---------------------------------------------------------------------- Patient Info  ID #:       [id number]                          D.O.B.:  [birth year] (41 yrs)  Name:  Bridget Scott                Visit Date: 03/26/2022 01:17 pm ---------------------------------------------------------------------- Performed By  Attending:        Johnell Comings MD         Ref. Address:     [place]  Performed By:     Jacob Moores BS,       Location[facility]                    RDMS, RVT                                Fetal Care at                                                             [place] [facility]                                                             [facility]  Referred By:      [facility] ---------------------------------------------------------------------- Orders  #  Description                            Code        Ordered By  1  [place] MFM UA CORD DOPPLER                [id number]    Bridget FANG  2  [place] MFM OB LIMITED                     [id number]    Bridget FANG ----------------------------------------------------------------------  #  Order #                     Accession #                Episode #  1  [id number]                   [id number]                 [id number]  2  [id number]                   [id number]                 [id number] ---------------------------------------------------------------------- Indications  Maternal care for known or suspected poor      O36.5920  fetal growth, second trimester, not applicable  or unspecified IUGR  Absent end diastolic flow (Fetal and  O36.90X0  placental problem in pregnancy)  Advanced maternal age multigravida 24+,        O69.522  second trimester (72 yrs)  Hypertension - Chronic/Pre-existing            O10.019  (Procardia)  Pre-existing diabetes, type 2, in pregnancy,   O24.112  second trimester (metformin, insulin)  [redacted] weeks gestation of pregnancy                [id number]  Obesity complicating pregnancy, second         O99.212  trimester (Pre-G BMI 30.3)  Cervical cerclage suture present, second       O34.32  trimester (12/21/21)  Poor obstetric history: Previous preterm       O09.219  delivery, antepartum (PPROM, 20wks)  Asthma                                         O99.89 j45.909  LR NIPS/AFP Neg/Horizon Neg ---------------------------------------------------------------------- Fetal Evaluation  Num Of Fetuses:         1  Fetal Heart Rate(bpm):  146  Cardiac Activity:       Observed  Presentation:           Cephalic  Placenta:               Posterior  P. Cord Insertion:      Previously visualized  Amniotic Fluid  AFI FV:      Within normal limits                              Largest Pocket(cm)                              3.22 ---------------------------------------------------------------------- OB History  Gravidity:    3         Term:   0        Prem:   1         SAB:   1  TOP:          0       Ectopic:  0        Living: 1 ---------------------------------------------------------------------- Gestational Age  LMP:           30w 0d        Date:  08/28/21                   EDD:   06/04/22  Best:          27w 6d     Det. By:  U/S  (12/23/21)          EDD:   06/19/22 ---------------------------------------------------------------------- Anatomy  Cranium:               Appears normal         LVOT:                   Previously seen  Cavum:                 Previously             Aortic Arch:            Previously seen  visualized  Ventricles:            Appears normal         Ductal Arch:            Previously seen  Choroid Plexus:        Previously seen        Diaphragm:              Appears normal  Cerebellum:            Previously seen        Stomach:                Appears normal, left                                                                        sided  Posterior Fossa:       Previously seen        Abdomen:                Previously seen  Nuchal Fold:           Previously seen        Abdominal Wall:         Previously seen  Face:                  Orbits and profile     Cord Vessels:           Previously seen                         previously seen  Lips:                  Previously seen        Kidneys:                Appear normal  Palate:                Previously             Bladder:                Appears normal                         visualized  Thoracic:              Appears normal         Spine:                  Not well visualized                                                                        (Trv views normal)  Heart:                 Appears normal         Upper Extremities:      Previously seen                         (  4CH, axis, and                         situs)  RVOT:                  Previously seen        Lower Extremities:      Previously seen  Other:  Female gender, Nasal bone, lenses, Heels/feet, open hands/5th digits           prev visualized. Technically difficult due to maternal habitus and fetal          position. ---------------------------------------------------------------------- Doppler - Fetal Vessels  Umbilical Artery                                                              ADFV    RDFV                                                                Yes     Yes ---------------------------------------------------------------------- Cervix Uterus Adnexa  Cervix  Not visualized (advanced GA >24wks)  Uterus  No abnormality visualized.  Right Ovary  Within normal limits.  Left Ovary  Within normal limits.  Cul De Sac  No free fluid seen.  Adnexa  No adnexal mass visualized ---------------------------------------------------------------------- Comments  Bridget Scott has been followed due to severe IUGR.  She  is currently at 27 weeks and 6 days.  Her most recent EFW  performed 4 days ago was 522 g (1 pound 2 ounces, less  than the 1st percentile).  Intermittent absent end-diastolic flow  has also been noted on her umbilical artery Doppler studies.  She received a complete course of antenatal corticosteroids  last week.  She denies any problems since her last exam and reports  feeling fetal movements throughout the day.  Her blood pressures remain mildly elevated 157/81 and  141/82.  Intermittent reversed end-diastolic flow was noted on today's  umbilical artery Doppler studies.  There was normal amniotic  fluid noted.  Fetal movements were noted throughout today's ultrasound  exam.  She had an NST following today's ultrasound exam that did  not show any decelerations.  There was minimal variability  and  one 10 x 10 accelerations noted.  The increased risk of a fetal demise due to severe IUGR with  reversed end-diastolic flow was discussed.  After an extensive discussion, the patient and her husband  agreed to be admitted to the hospital later this evening for  continuous monitoring.  Once she is admitted, she should be placed on  continuous  monitoring, receive magnesium sulfate for fetal  neuroprotection, and receive a rescue course (2 more doses)  of betamethasone.  She should also have a NICU consult.  We will repeat the umbilical artery Doppler studies again in 2  days (on Sunday morning).  Should persistent reversed end-diastolic flow be noted on  that exam, delivery will be recommended.  Delivery is recommended at any time should persistent  decelerations be noted on her fetal heart rate tracing.  As the fetus is unlikely to tolerate the labor process, she  should probably be delivered via C-section.  The patient has stated that she would like a tubal ligation  performed at the time of her cesarean delivery.  She has  signed all necessary state consent forms for the tubal ligation  during her prenatal visits.  She understands that a tubal ligation is permanent.  She  would like the tubal ligation performed regardless of the  outcome of her current pregnancy.  She stated that all of her questions were answered today.  A total of 20 minutes was spent counseling and coordinating  the care for this patient.  Greater than 50% of the time was  spent in direct face-to-face contact. ----------------------------------------------------------------------                  Johnell Comings, MD Electronically Signed Final Report   03/26/2022 05:32 pm ----------------------------------------------------------------------  [place] MFM UA CORD DOPPLER  Result Date: 03/24/2022 ----------------------------------------------------------------------  OBSTETRICS REPORT                       (Signed Final 03/24/2022 05:35 pm) ---------------------------------------------------------------------- Patient Info  ID #:       [id number]                          D.O.B.:  1980/03/20 (41 yrs)  Name:       Bridget Scott                Visit Date: 03/24/2022 02:58 pm ---------------------------------------------------------------------- Performed By  Attending:        Valeda Malm DO       Ref. Address:     [place]  Performed By:     Dorena Dew     Location:         [facility]                    BS, RDMS                                 Fetal Care at                                                             [place] [facility]                                                             Women  Referred By:      [facility] ---------------------------------------------------------------------- Orders  #  Description  Code        Ordered By  1  [place] MFM UA CORD DOPPLER                [id number]    Bridget FANG  2  [place] MFM OB LIMITED                     [id number]    Bridget FANG ----------------------------------------------------------------------  #  Order #                     Accession #                Episode #  1  [id number]                   [id number]                 [id number]  2  [id number]                   [id number]                 [id number] ---------------------------------------------------------------------- Indications  Maternal care for known or suspected poor      O36.5920  fetal growth, second trimester, not applicable  or unspecified IUGR  Absent end diastolic flow (Fetal and           O36.90X0  placental problem in pregnancy)  [redacted] weeks gestation of pregnancy                Z3A.27  Hypertension - Chronic/Pre-existing            O10.019  (Procardia)  Pre-existing diabetes, type 2, in pregnancy,   O24.112  second trimester (metformin, insulin)  Advanced maternal age multigravida 2+,        O77.522  second trimester (53 yrs)  Obesity complicating pregnancy, second         O99.212  trimester (Pre-G BMI 30.3)  Pre-existing essential hypertension            0000000  complicating pregnancy, second trimester  (procardia)  Cervical cerclage suture present, second       O34.32  trimester (12/21/21)  Asthma                                         O99.89 j45.909  Poor obstetric  history: Previous preterm       O09.219  delivery, antepartum (PPROM, 20wks)  LR NIPS/AFP Neg/Horizon Neg  Encounter for other antenatal screening        Z36.2  follow-up ---------------------------------------------------------------------- Fetal Evaluation  Num Of Fetuses:         1  Fetal Heart Rate(bpm):  141  Cardiac Activity:       Observed  Presentation:           Cephalic  Placenta:               Posterior  P. Cord Insertion:      Previously visualized  Amniotic Fluid  AFI FV:      Subjectively low-normal                              Largest Pocket(cm)  3.44 ---------------------------------------------------------------------- OB History  Gravidity:    3         Term:   0        Prem:   1        SAB:   1  TOP:          0       Ectopic:  0        Living: 1 ---------------------------------------------------------------------- Gestational Age  LMP:           29w 5d        Date:  08/28/21                   EDD:   06/04/22  Best:          27w 4d     Det. By:  U/S  (12/23/21)          EDD:   06/19/22 ---------------------------------------------------------------------- Doppler - Fetal Vessels  Umbilical Artery   S/D     %tile      RI    %tile      PI    %tile     PSV    ADFV    RDFV                                                     (cm/s)   5.81   > 97.5    0.83   > 97.5    1.89   > 97.5    40.37     Yes      No  Comment:    Intermittent absent EDF ---------------------------------------------------------------------- Comments  MFM Consult Note  Patient Name: Bridget Scott  Patient MRN:   [id number]  Referring provider: Osborne Oman, MD  [address]  [place],  [place] [address]  Reason for Consult: FGR  HPI: Bridget Scott is a 42 y.o. G3P0110 at [redacted]w[redacted]d here for ultrasound and consultation.  The patient was seen in the office today for fetal growth  restriction.  She has been offered admission due to  intermittent absent reversed end-diastolic flow on previous  ultrasounds but  declined due to child care.  Today her blood  pressures are elevated in the 160s over 80s with a repeat in  the 150s over 90s.  She is asymptomatic from a  preeclampsia perspective.  I discussed that if she is able to  go to the MAU and has a normal fetal tracing for at least 1 to  2 hours in addition to resolution of her high range blood  pressures and normal labs then consideration of discharge  can be done.  However, I also discussed that admission to  the hospital is very reasonable given the increased risk of  stillbirth.  If she is not admitted she should have twice weekly  outpatient antenatal testing.  I notified  the MAU provider  regarding her arrival.  We discussed at length that there is no  strong evidence to say that hospitalization of intermittent  absent end-diastolic flow with fetal growth restriction  improved outcomes.  I also discussed there is a social and  medical cost association with admission to the hospital.  I  also discussed that this does allow for more frequent  monitoring and may decrease the risk of stillbirth although  this is unproven.  I discussed that ultimately I would defer to  the patient's comfort level and support her decision.  She is  going to make her decision after she is evaluated at the MAU.  Review of Systems: A review of systems was performed and  was negative except per HPI  Vitals and Physical Exam  BP 160/88 with a repeat of 150/90, HR 69, pregravid BMI:  30.2.  Sitting comfortably on the sonogram table  Nonlabored breathing  Normal rate and rhythm  Abdomen is nontender  Genetic testing: lwo risk NIPS  Sonographic findings:  Single intrauterine pregnancy.  Fetal cardiac activity:  Observed and appears normal.  Presentation: Cephalic.  Interval fetal anatomy appears normal.  Amniotic fluid volume: Subjectively low-norma. MVP: 3.44 cm.  Placenta: Posterior.  Umbilical artery dopplers findings: Intermittent absent end-  diastolic flow.  No reversed flow.  Assessment   - FGR, periods of end diastolic flow  - Elevated BP  Plan  -Sent to the MAU for preeclampsia rule out and consideration  of admission due to end-diastolic flow seen intermittently on  ultrasound  -She should have 1 to 2 hours of fetal heart tracing  -Preeclampsia labs (CBC, CMP, UPC)  -Consideration of admission if the blood pressure, laboratory  evaluation or fetal heart tracing is abnormal. ----------------------------------------------------------------------                  Valeda Malm, DO Electronically Signed Final Report   03/24/2022 05:35 pm ----------------------------------------------------------------------  [place] MFM OB LIMITED  Result Date: 03/24/2022 ----------------------------------------------------------------------  OBSTETRICS REPORT                       (Signed Final 03/24/2022 05:35 pm) ---------------------------------------------------------------------- Patient Info  ID #:       [id number]                          D.O.B.:  05-Jun-1980 (41 yrs)  Name:       Bridget Scott                Visit Date: 03/24/2022 02:58 pm ---------------------------------------------------------------------- Performed By  Attending:        Valeda Malm DO       Ref. Address:     [place]  Performed By:     Dorena Dew     [facility]  Referred By:      [facility] ---------------------------------------------------------------------- Orders  #  Description                           Code        Ordered By  1  [place] MFM UA CORD DOPPLER                [phone]    Peterson Ao  2  [place] MFM OB LIMITED                     [id number]    Bridget FANG  ----------------------------------------------------------------------  #  Order #                     Accession #                Episode #  1  [id number]                   [id number]                 [id number]  2  [id number]                   [id number]                 [id number] ---------------------------------------------------------------------- Indications  Maternal care for known or suspected poor      O36.5920  fetal growth, second trimester, not applicable  or unspecified IUGR  Absent end diastolic flow (Fetal and           O36.90X0  placental problem in pregnancy)  [redacted] weeks gestation of pregnancy                Z3A.27  Hypertension - Chronic/Pre-existing            O10.019  (Procardia)  Pre-existing diabetes, type 2, in pregnancy,   O24.112  second trimester (metformin, insulin)  Advanced maternal age multigravida 64+,        O95.522  second trimester (66 yrs)  Obesity complicating pregnancy, second         O99.212  trimester (Pre-G BMI 30.3)  Pre-existing essential hypertension            0000000  complicating pregnancy, second trimester  (procardia)  Cervical cerclage suture present, second       O34.32  trimester (12/21/21)  Asthma                                         O99.89 j45.909  Poor obstetric history: Previous preterm       O09.219  delivery, antepartum (PPROM, 20wks)  LR NIPS/AFP Neg/Horizon Neg  Encounter for other antenatal screening        Z36.2  follow-up ---------------------------------------------------------------------- Fetal Evaluation  Num Of Fetuses:         1  Fetal Heart Rate(bpm):  141  Cardiac Activity:  Observed  Presentation:           Cephalic  Placenta:               Posterior  P. Cord Insertion:      Previously visualized  Amniotic Fluid  AFI FV:      Subjectively low-normal                              Largest Pocket(cm)                              3.44 ---------------------------------------------------------------------- OB History  Gravidity:    3         Term:   0         Prem:   1        SAB:   1  TOP:          0       Ectopic:  0        Living: 1 ---------------------------------------------------------------------- Gestational Age  LMP:           29w 5d        Date:  08/28/21                   EDD:   06/04/22  Best:          27w 4d     Det. By:  U/S  (12/23/21)          EDD:   06/19/22 ---------------------------------------------------------------------- Doppler - Fetal Vessels  Umbilical Artery   S/D     %tile      RI    %tile      PI    %tile     PSV    ADFV    RDFV                                                     (cm/s)   5.81   > 97.5    0.83   > 97.5    1.89   > 97.5    40.37     Yes      No  Comment:    Intermittent absent EDF ---------------------------------------------------------------------- Comments  MFM Consult Note  Patient Name: ZO Scott  Patient MRN:   [id number]  Referring provider: Osborne Oman, MD  [address]  [place],  [place] [address]  Reason for Consult: FGR  HPI: Bridget Scott is a 42 y.o. G3P0110 at [redacted]w[redacted]d here for ultrasound and consultation.  The patient was seen in the office today for fetal growth  restriction.  She has been offered admission due to  intermittent absent reversed end-diastolic flow on previous  ultrasounds but declined due to child care.  Today her blood  pressures are elevated in the 160s over 80s with a repeat in  the 150s over 90s.  She is asymptomatic from a  preeclampsia perspective.  I discussed that if she is able to  go to the MAU and has a normal fetal tracing for at least 1 to  2 hours in addition to resolution of her high range blood  pressures and normal labs then consideration of discharge  can be done.  However, I also discussed that admission to  the hospital is very reasonable given the increased risk of  stillbirth.  If she is not admitted she should have twice weekly  outpatient antenatal testing.  I notified  the MAU provider  regarding her arrival.  We discussed at length that there is no  strong  evidence to say that hospitalization of intermittent  absent end-diastolic flow with fetal growth restriction  improved outcomes.  I also discussed there is a social and  medical cost association with admission to the hospital.  I  also discussed that this does allow for more frequent  monitoring and may decrease the risk of stillbirth although  this is unproven.  I discussed that ultimately I would defer to  the patient's comfort level and support her decision.  She is  going to make her decision after she is evaluated at the MAU.  Review of Systems: A review of systems was performed and  was negative except per HPI  Vitals and Physical Exam  BP 160/88 with a repeat of 150/90, HR 69, pregravid BMI:  30.2.  Sitting comfortably on the sonogram table  Nonlabored breathing  Normal rate and rhythm  Abdomen is nontender  Genetic testing: lwo risk NIPS  Sonographic findings:  Single intrauterine pregnancy.  Fetal cardiac activity:  Observed and appears normal.  Presentation: Cephalic.  Interval fetal anatomy appears normal.  Amniotic fluid volume: Subjectively low-norma. MVP: 3.44 cm.  Placenta: Posterior.  Umbilical artery dopplers findings: Intermittent absent end-  diastolic flow.  No reversed flow.  Assessment  - FGR, periods of end diastolic flow  - Elevated BP  Plan  -Sent to the MAU for preeclampsia rule out and consideration  of admission due to end-diastolic flow seen intermittently on  ultrasound  -She should have 1 to 2 hours of fetal heart tracing  -Preeclampsia labs (CBC, CMP, UPC)  -Consideration of admission if the blood pressure, laboratory  evaluation or fetal heart tracing is abnormal. ----------------------------------------------------------------------                  Valeda Malm, DO Electronically Signed Final Report   03/24/2022 05:35 pm ----------------------------------------------------------------------    Medications:  Scheduled  betamethasone acetate-betamethasone sodium phosphate  12 mg  Intramuscular Q24 Hr x 2   famotidine  20 mg Oral BID   insulin aspart  0-15 Units Subcutaneous 5 X Daily   insulin glargine-yfgn  13 Units Subcutaneous QHS   metFORMIN  1,000 mg Oral BID WC   NIFEdipine  90 mg Oral Daily   prenatal multivitamin  1 tablet Oral Q1200   I have reviewed the patient's current medications.  ASSESSMENT: G3P0110 6w0dEstimated Date of Delivery: 06/19/22  FGR, severe + early, <1% with IR UAD   Patient Active Problem List   Diagnosis Date Noted   Fetal growth restriction antepartum 03/27/2022   Severe IUGR (intrauterine growth restriction) affecting care of mother, second trimester 03/02/2022   Cervical cerclage suture present 02/01/2022   Rubella non-immune status, antepartum 12/28/2021   Type 2 diabetes mellitus affecting pregnancy, antepartum 12/23/2021   Chronic hypertension affecting pregnancy 12/23/2021   History of preterm premature rupture of membranes (PPROM) at 230weeks 12/15/2021   Supervision of high-risk pregnancy 12/15/2021   Advanced maternal age in multigravida 12/15/2021   [place]ronic [place]itis ([place] 11/11/20[place]   Cervical radiculopathy 04/09/2021   History of trauma 03/21/2019   Major depressive disorder, recurrent episode, severe with anxious distress ([place] 03/21/2019   Panic attacks 03/21/2019  Tobacco use disorder 05/27/2017   Hypertension    Diabetes mellitus without complication (HCC)    Asthma affecting pregnancy in second trimester    GERD (gastroesophageal reflux disease)    Obesity 04/26/2013   Hodgkin's disease in remission ([place]) 10/03/2012    PLAN: If decels deliver by C section If continuous reverese flow, deliver by C section  Pt wants a tubal no matter  Rescue steroid Magnesium prophylaxis for ICH Continuous monitoring NICU consult is done  [employer] 03/27/2022,11:48 AM

## 2022-03-28 ENCOUNTER — Encounter (HOSPITAL_COMMUNITY): Payer: Self-pay | Admitting: Obstetrics & Gynecology

## 2022-03-28 ENCOUNTER — Inpatient Hospital Stay (HOSPITAL_BASED_OUTPATIENT_CLINIC_OR_DEPARTMENT_OTHER): Payer: Medicaid Other

## 2022-03-28 DIAGNOSIS — O09213 Supervision of pregnancy with history of pre-term labor, third trimester: Secondary | ICD-10-CM | POA: Diagnosis not present

## 2022-03-28 DIAGNOSIS — O43893 Other placental disorders, third trimester: Secondary | ICD-10-CM

## 2022-03-28 DIAGNOSIS — O10013 Pre-existing essential hypertension complicating pregnancy, third trimester: Secondary | ICD-10-CM

## 2022-03-28 DIAGNOSIS — Z3A28 28 weeks gestation of pregnancy: Secondary | ICD-10-CM

## 2022-03-28 DIAGNOSIS — J45909 Unspecified asthma, uncomplicated: Secondary | ICD-10-CM | POA: Diagnosis not present

## 2022-03-28 DIAGNOSIS — O365930 Maternal care for other known or suspected poor fetal growth, third trimester, not applicable or unspecified: Secondary | ICD-10-CM | POA: Diagnosis not present

## 2022-03-28 DIAGNOSIS — O09513 Supervision of elderly primigravida, third trimester: Secondary | ICD-10-CM | POA: Diagnosis not present

## 2022-03-28 DIAGNOSIS — O24113 Pre-existing diabetes mellitus, type 2, in pregnancy, third trimester: Secondary | ICD-10-CM

## 2022-03-28 DIAGNOSIS — O10913 Unspecified pre-existing hypertension complicating pregnancy, third trimester: Secondary | ICD-10-CM

## 2022-03-28 DIAGNOSIS — O99513 Diseases of the respiratory system complicating pregnancy, third trimester: Secondary | ICD-10-CM | POA: Diagnosis not present

## 2022-03-28 DIAGNOSIS — Z794 Long term (current) use of insulin: Secondary | ICD-10-CM

## 2022-03-28 DIAGNOSIS — E119 Type 2 diabetes mellitus without complications: Secondary | ICD-10-CM | POA: Diagnosis not present

## 2022-03-28 DIAGNOSIS — O09523 Supervision of elderly multigravida, third trimester: Secondary | ICD-10-CM

## 2022-03-28 LAB — GLUCOSE, CAPILLARY
Glucose-Capillary: 325 mg/dL — ABNORMAL HIGH (ref 70–99)
Glucose-Capillary: 227 mg/dL — ABNORMAL HIGH (ref 70–99)

## 2022-03-28 MED ORDER — NIFEDIPINE ER OSMOTIC RELEASE 90 MG PO TB24: 90.0000 mg | ORAL_TABLET | Freq: Every day | ORAL | 3 refills | Status: AC

## 2022-03-28 MED ORDER — INSULIN GLARGINE 100 UNIT/ML ~~LOC~~ SOLN: 20.0000 [IU] | Freq: Every day | SUBCUTANEOUS | 11 refills | Status: DC

## 2022-03-28 NOTE — Progress Notes (Signed)
Patient eating breakfast and sitting on couch.  She wants to be off monitor to go to bathroom, bathe and wait for ultrasound.  MD notified.

## 2022-03-28 NOTE — Discharge Summary (Signed)
Antenatal Physician Discharge Summary  Patient ID: Bridget Scott MRN: [id number] DOB/AGE: 07/26/1980 42 y.o.  Admit date: 03/26/2022 Discharge date: 03/28/2022  Admission Diagnoses: Fetal growth restriction antepartum [O36.5990] Intermittent reverse end diastolic flow  Discharge Diagnoses:  Fetal growth restriction antepartum [O36.5990] Intermittent absent end diastolic flow   Prenatal Procedures: NST and ultrasound  Consults: Neonatology, Maternal Fetal Medicine  Hospital Course:  Bridget Scott is a 42 y.o. G3P0110 with IUP at 81w1dadmitted for monitoring in the setting of FGR with intermittent reverse end diastolic flow on her dopplers.  She was admitted for CEFM, BMZ and Magnesium. She received all of these measures while in the hospital. She had repeat dopplers today which showed some flow during diastole and some showed absent end diastolic flow per Dr. FAnnamaria Bootswho saw the patient himself today. Please see his note for additional details. During her admission her CEFM was reassuring with moderate variability and + accels. She had occasional but rare decels which would be considered still normal for this gestational age.   For her CHTN, her procardia was increased to 90 mg daily which helped control her blood pressures. Her preeclampsia labs were negative. Her P/C ratio was stable from baseline.   For her T2DM, her blood sugars. She was not following a diabetic diet (soda, simple carbs) and was counseled by multiple providers. Her Semglee was increased to 20 units qhs. She will continue the MTF 1000 BID as well.   She was seen by Neonatology during her stay.    The patient was highly desiring discharge and was deemed stable for discharge to home with outpatient follow up.  Discharge Exam: Temp:  [97.5 F (36.4 C)-98 F (36.7 C)] 97.5 F (36.4 C) (03/03 1136) Pulse Rate:  [63-83] 68 (03/03 1136) Resp:  [14-18] 18 (03/03 1136) BP: (135-150)/(70-92) 150/92 (03/03 1136) SpO2:   [98 %-100 %] 98 % (03/03 1136) Weight:  [IS:[id number]] 89 kg (03/02 2336) Physical Examination: CONSTITUTIONAL: Well-developed, well-nourished female in no acute distress.  HENT:  Normocephalic, atraumatic, External right and left ear normal.  EYES: Conjunctivae and EOM are normal. Pupils are equal, round, and reactive to light. No scleral icterus.  NECK: Normal range of motion, supple, no masses SKIN: Skin is warm and dry. No rash noted. Not diaphoretic. No erythema. No pallor. NEUROLOGIC: Alert and oriented to person, place, and time. Normal reflexes, muscle tone coordination. No cranial nerve deficit noted. PSYCHIATRIC: Normal mood and affect. Normal behavior. Normal judgment and thought content. CARDIOVASCULAR: Normal heart rate noted, regular rhythm RESPIRATORY: Effort and breath sounds normal, no problems with respiration noted MUSCULOSKELETAL: Normal range of motion. No edema and no tenderness. 2+ distal pulses. ABDOMEN: Soft, nontender, nondistended, gravid.   Fetal monitoring: FHR: 150 bpm, Variability: moderate, Accelerations: Present (appropriate for GA), Decelerations: Absent  Uterine activity: no contractions per hour  Significant Diagnostic Studies:  Results for orders placed or performed during the hospital encounter of 03/26/22 (from the past 168 hour(s))  Glucose, capillary   Collection Time: 03/26/22 10:03 PM  Result Value Ref Range   Glucose-Capillary 277 (H) 70 - 99 mg/dL   Comment 1 Notify RN   Type and screen [facility]  Collection Time: 03/26/22 10:54 PM  Result Value Ref Range   ABO/RH(D) A POS    Antibody Screen NEG    Sample Expiration      03/29/2022,2359 Performed at [facility] [address], [place] [address]  Comprehensive metabolic  panel   Collection Time: 03/26/22 10:58 PM  Result Value Ref Range   Sodium 129 (L) 135 - 145 mmol/L   Potassium 4.0 3.5 - 5.1 mmol/L   Chloride 102 98 - 111 mmol/L   CO2 17 (L) 22 - 32  mmol/L   Glucose, Bld 323 (H) 70 - 99 mg/dL   BUN 8 6 - 20 mg/dL   Creatinine, Ser 0.50 0.44 - 1.00 mg/dL   Calcium 8.6 (L) 8.9 - 10.3 mg/dL   Total Protein 6.3 (L) 6.5 - 8.1 g/dL   Albumin 3.0 (L) 3.5 - 5.0 g/dL   AST 16 15 - 41 U/L   ALT 14 0 - 44 U/L   Alkaline Phosphatase 71 38 - 126 U/L   Total Bilirubin 0.5 0.3 - 1.2 mg/dL   GFR, Estimated >60 >60 mL/min   Anion gap 10 5 - 15  CBC   Collection Time: 03/26/22 10:58 PM  Result Value Ref Range   WBC 18.7 (H) 4.0 - 10.5 K/uL   RBC 4.41 3.87 - 5.11 MIL/uL   Hemoglobin 13.6 12.0 - 15.0 g/dL   HCT 40.2 36.0 - 46.0 %   MCV 91.2 80.0 - 100.0 fL   MCH 30.8 26.0 - 34.0 pg   MCHC 33.8 30.0 - 36.0 g/dL   RDW 13.5 11.5 - 15.5 %   Platelets 194 150 - 400 K/uL   nRBC 0.0 0.0 - 0.2 %  Culture, beta strep (group b only)   Collection Time: 03/27/22 12:00 AM   Specimen: Vaginal/Rectal; Genital  Result Value Ref Range   Specimen Description VAGINAL/RECTAL    Special Requests NONE    Culture      CULTURE REINCUBATED FOR BETTER GROWTH Performed at [facility], [address], [place], [place] [address]    Report Status PENDING   Urinalysis, Routine w reflex microscopic -Urine, Clean Catch   Collection Time: 03/27/22 12:00 AM  Result Value Ref Range   Color, Urine YELLOW YELLOW   APPearance CLEAR CLEAR   Specific Gravity, Urine 1.023 1.005 - 1.030   pH 6.0 5.0 - 8.0   Glucose, UA >=500 (A) NEGATIVE mg/dL   Hgb urine dipstick NEGATIVE NEGATIVE   Bilirubin Urine NEGATIVE NEGATIVE   Ketones, ur 20 (A) NEGATIVE mg/dL   Protein, ur NEGATIVE NEGATIVE mg/dL   Nitrite NEGATIVE NEGATIVE   Leukocytes,Ua NEGATIVE NEGATIVE   RBC / HPF 0-5 0 - 5 RBC/hpf   WBC, UA 0-5 0 - 5 WBC/hpf   Bacteria, UA NONE SEEN NONE SEEN   Squamous Epithelial / HPF 0-5 0 - 5 /HPF  Protein / creatinine ratio, urine   Collection Time: 03/27/22 12:00 AM  Result Value Ref Range   Creatinine, Urine 26 mg/dL   Total Protein, Urine 9 mg/dL   Protein Creatinine  Ratio 0.35 (H) 0.00 - 0.15 mg/mg[Cre]  Glucose, capillary   Collection Time: 03/27/22  5:15 AM  Result Value Ref Range   Glucose-Capillary 320 (H) 70 - 99 mg/dL   Comment 1 Notify RN   Glucose, capillary   Collection Time: 03/27/22 11:15 AM  Result Value Ref Range   Glucose-Capillary 366 (H) 70 - 99 mg/dL  Glucose, capillary   Collection Time: 03/27/22  3:07 PM  Result Value Ref Range   Glucose-Capillary 339 (H) 70 - 99 mg/dL  Glucose, capillary   Collection Time: 03/27/22  9:55 PM  Result Value Ref Range   Glucose-Capillary 370 (H) 70 - 99 mg/dL   Comment  1 Notify RN   Glucose, capillary   Collection Time: 03/27/22 11:35 PM  Result Value Ref Range   Glucose-Capillary 315 (H) 70 - 99 mg/dL   Comment 1 Notify RN   Glucose, capillary   Collection Time: 03/28/22  6:08 AM  Result Value Ref Range   Glucose-Capillary 325 (H) 70 - 99 mg/dL   Comment 1 Notify RN   Glucose, capillary   Collection Time: 03/28/22 11:59 AM  Result Value Ref Range   Glucose-Capillary 227 (H) 70 - 99 mg/dL  Results for orders placed or performed during the hospital encounter of 03/24/22 (from the past 168 hour(s))  CBC   Collection Time: 03/24/22  5:25 PM  Result Value Ref Range   WBC 17.2 (H) 4.0 - 10.5 K/uL   RBC 4.29 3.87 - 5.11 MIL/uL   Hemoglobin 13.4 12.0 - 15.0 g/dL   HCT 38.5 36.0 - 46.0 %   MCV 89.7 80.0 - 100.0 fL   MCH 31.2 26.0 - 34.0 pg   MCHC 34.8 30.0 - 36.0 g/dL   RDW 13.3 11.5 - 15.5 %   Platelets 193 150 - 400 K/uL   nRBC 0.0 0.0 - 0.2 %  Comprehensive metabolic panel   Collection Time: 03/24/22  5:25 PM  Result Value Ref Range   Sodium 134 (L) 135 - 145 mmol/L   Potassium 3.5 3.5 - 5.1 mmol/L   Chloride 107 98 - 111 mmol/L   CO2 15 (L) 22 - 32 mmol/L   Glucose, Bld 136 (H) 70 - 99 mg/dL   BUN 6 6 - 20 mg/dL   Creatinine, Ser 0.44 0.44 - 1.00 mg/dL   Calcium 9.1 8.9 - 10.3 mg/dL   Total Protein 6.2 (L) 6.5 - 8.1 g/dL   Albumin 3.0 (L) 3.5 - 5.0 g/dL   AST 12 (L) 15 - 41  U/L   ALT 12 0 - 44 U/L   Alkaline Phosphatase 64 38 - 126 U/L   Total Bilirubin 0.5 0.3 - 1.2 mg/dL   GFR, Estimated >60 >60 mL/min   Anion gap 12 5 - 15   [place] MFM UA CORD DOPPLER  Result Date: 03/26/2022 ----------------------------------------------------------------------  OBSTETRICS REPORT                       (Signed Final 03/26/2022 05:32 pm) ---------------------------------------------------------------------- Patient Info  ID #:       [id number]                          D.O.B.:  12-28-1980 (41 yrs)  Name:       Bridget Scott                Visit Date: 03/26/2022 01:17 pm ---------------------------------------------------------------------- Performed By  Attending:        Johnell Comings MD         Ref. Address:     [place]  Performed By:     Jacob Moores BS,       [facility], RVT                                [facility] at                                                             [place] [facility]                                                             [facility]  Referred By:      [facility] ---------------------------------------------------------------------- Orders  #  Description                           Code        Ordered By  1  [place] MFM UA CORD DOPPLER                [phone]    Peterson Ao  2  [place] MFM OB LIMITED                     [id number]    YU FANG ----------------------------------------------------------------------  #  Order #                     Accession #                Episode #  1  [id number]                   [id number]                 [id number]  2  [id number]                   [id number]                 [id number] ---------------------------------------------------------------------- Indications  Maternal care for known or suspected poor      O36.5920  fetal growth, second trimester, not applicable  or unspecified IUGR  Absent end diastolic flow (Fetal and            O36.90X0  placental problem in pregnancy)  Advanced maternal age multigravida 18+,        O53.522  second trimester (86 yrs)  Hypertension - Chronic/Pre-existing            O10.019  (Procardia)  Pre-existing diabetes, type 2, in pregnancy,   O24.112  second trimester (metformin, insulin)  [redacted] weeks gestation of pregnancy                [id number]  Obesity complicating pregnancy, second         O99.212  trimester (Pre-G BMI 30.3)  Cervical cerclage suture present, second       O34.32  trimester (12/21/21)  Poor obstetric history: Previous preterm       O09.219  delivery, antepartum (PPROM, 20wks)  Asthma  O99.89 j45.909  LR NIPS/AFP Neg/Horizon Neg ---------------------------------------------------------------------- Fetal Evaluation  Num Of Fetuses:         1  Fetal Heart Rate(bpm):  146  Cardiac Activity:       Observed  Presentation:           Cephalic  Placenta:               Posterior  P. Cord Insertion:      Previously visualized  Amniotic Fluid  AFI FV:      Within normal limits                              Largest Pocket(cm)                              3.22 ---------------------------------------------------------------------- OB History  Gravidity:    3         Term:   0        Prem:   1        SAB:   1  TOP:          0       Ectopic:  0        Living: 1 ---------------------------------------------------------------------- Gestational Age  LMP:           30w 0d        Date:  08/28/21                   EDD:   06/04/22  Best:          27w 6d     Det. By:  U/S  (12/23/21)          EDD:   06/19/22 ---------------------------------------------------------------------- Anatomy  Cranium:               Appears normal         LVOT:                   Previously seen  Cavum:                 Previously             Aortic Arch:            Previously seen                         visualized  Ventricles:            Appears normal         Ductal Arch:            Previously seen  Choroid  Plexus:        Previously seen        Diaphragm:              Appears normal  Cerebellum:            Previously seen        Stomach:                Appears normal, left  sided  Posterior Fossa:       Previously seen        Abdomen:                Previously seen  Nuchal Fold:           Previously seen        Abdominal Wall:         Previously seen  Face:                  Orbits and profile     Cord Vessels:           Previously seen                         previously seen  Lips:                  Previously seen        Kidneys:                Appear normal  Palate:                Previously             Bladder:                Appears normal                         visualized  Thoracic:              Appears normal         Spine:                  Not well visualized                                                                        (Trv views normal)  Heart:                 Appears normal         Upper Extremities:      Previously seen                         (4CH, axis, and                         situs)  RVOT:                  Previously seen        Lower Extremities:      Previously seen  Other:  Female gender, Nasal bone, lenses, Heels/feet, open hands/5th digits          prev visualized. Technically difficult due to maternal habitus and fetal          position. ---------------------------------------------------------------------- Doppler - Fetal Vessels  Umbilical Artery  ADFV    RDFV                                                                Yes     Yes ---------------------------------------------------------------------- Cervix Uterus Adnexa  Cervix  Not visualized (advanced GA >24wks)  Uterus  No abnormality visualized.  Right Ovary  Within normal limits.  Left Ovary  Within normal limits.  Cul De Sac  No free fluid seen.  Adnexa  No adnexal mass visualized  ---------------------------------------------------------------------- Comments  Bridget Scott has been followed due to severe IUGR.  She  is currently at 27 weeks and 6 days.  Her most recent EFW  performed 4 days ago was 522 g (1 pound 2 ounces, less  than the 1st percentile).  Intermittent absent end-diastolic flow  has also been noted on her umbilical artery Doppler studies.  She received a complete course of antenatal corticosteroids  last week.  She denies any problems since her last exam and reports  feeling fetal movements throughout the day.  Her blood pressures remain mildly elevated 157/81 and  141/82.  Intermittent reversed end-diastolic flow was noted on today's  umbilical artery Doppler studies.  There was normal amniotic  fluid noted.  Fetal movements were noted throughout today's ultrasound  exam.  She had an NST following today's ultrasound exam that did  not show any decelerations.  There was minimal variability  and  one 10 x 10 accelerations noted.  The increased risk of a fetal demise due to severe IUGR with  reversed end-diastolic flow was discussed.  After an extensive discussion, the patient and her husband  agreed to be admitted to the hospital later this evening for  continuous monitoring.  Once she is admitted, she should be placed on continuous  monitoring, receive magnesium sulfate for fetal  neuroprotection, and receive a rescue course (2 more doses)  of betamethasone.  She should also have a NICU consult.  We will repeat the umbilical artery Doppler studies again in 2  days (on Sunday morning).  Should persistent reversed end-diastolic flow be noted on  that exam, delivery will be recommended.  Delivery is recommended at any time should persistent  decelerations be noted on her fetal heart rate tracing.  As the fetus is unlikely to tolerate the labor process, she  should probably be delivered via C-section.  The patient has stated that she would like a tubal ligation  performed at the  time of her cesarean delivery.  She has  signed all necessary state consent forms for the tubal ligation  during her prenatal visits.  She understands that a tubal ligation is permanent.  She  would like the tubal ligation performed regardless of the  outcome of her current pregnancy.  She stated that all of her questions were answered today.  A total of 20 minutes was spent counseling and coordinating  the care for this patient.  Greater than 50% of the time was  spent in direct face-to-face contact. ----------------------------------------------------------------------                  Johnell Comings, MD Electronically Signed Final Report   03/26/2022 05:32 pm ----------------------------------------------------------------------  [place] MFM OB LIMITED  Result Date: 03/26/2022 ----------------------------------------------------------------------  OBSTETRICS REPORT                       (  Signed Final 03/26/2022 05:32 pm) ---------------------------------------------------------------------- Patient Info  ID #:       [id number]                          D.O.B.:  07-28-1980 (41 yrs)  Name:       Bridget Scott                Visit Date: 03/26/2022 01:17 pm ---------------------------------------------------------------------- Performed By  Attending:        Johnell Comings MD         Ref. Address:     [place]  Performed By:     Jacob Moores BS,       Location[facility]                    RD[facility], RVT                                [facility] at                                                             [place] for                                                             Women  Referred By:      [facility] ---------------------------------------------------------------------- Orders  #  Description                           Code        Ordered By  1  [place] MFM UA CORD DOPPLER                [id number]    YU FANG  2  [place] MFM OB LIMITED                      [id number]    YU FANG ----------------------------------------------------------------------  #  Order #                     Accession #                Episode #  1  [id number]                   [id number]                 [id number]  2  [id number]  [id number]                 [id number] ---------------------------------------------------------------------- Indications  Maternal care for known or suspected poor      O36.5920  fetal growth, second trimester, not applicable  or unspecified IUGR  Absent end diastolic flow (Fetal and           O36.90X0  placental problem in pregnancy)  Advanced maternal age multigravida 89+,        O64.522  second trimester (77 yrs)  Hypertension - Chronic/Pre-existing            O10.019  (Procardia)  Pre-existing diabetes, type 2, in pregnancy,   O24.112  second trimester (metformin, insulin)  [redacted] weeks gestation of pregnancy                [id number]  Obesity complicating pregnancy, second         O99.212  trimester (Pre-G BMI 30.3)  Cervical cerclage suture present, second       O34.32  trimester (12/21/21)  Poor obstetric history: Previous preterm       O09.219  delivery, antepartum (PPROM, 20wks)  Asthma                                         O99.89 j45.909  LR NIPS/AFP Neg/Horizon Neg ---------------------------------------------------------------------- Fetal Evaluation  Num Of Fetuses:         1  Fetal Heart Rate(bpm):  146  Cardiac Activity:       Observed  Presentation:           Cephalic  Placenta:               Posterior  P. Cord Insertion:      Previously visualized  Amniotic Fluid  AFI FV:      Within normal limits                              Largest Pocket(cm)                              3.22 ---------------------------------------------------------------------- OB History  Gravidity:    3         Term:   0        Prem:   1        SAB:   1  TOP:          0       Ectopic:  0        Living: 1  ---------------------------------------------------------------------- Gestational Age  LMP:           30w 0d        Date:  08/28/21                   EDD:   06/04/22  Best:          27w 6d     Det. By:  U/S  (12/23/21)          EDD:   06/19/22 ---------------------------------------------------------------------- Anatomy  Cranium:               Appears normal         LVOT:                   Previously seen  Cavum:                 Previously             Aortic Arch:            Previously seen                         visualized  Ventricles:            Appears normal         Ductal Arch:            Previously seen  Choroid Plexus:        Previously seen        Diaphragm:              Appears normal  Cerebellum:            Previously seen        Stomach:                Appears normal, left                                                                        sided  Posterior Fossa:       Previously seen        Abdomen:                Previously seen  Nuchal Fold:           Previously seen        Abdominal Wall:         Previously seen  Face:                  Orbits and profile     Cord Vessels:           Previously seen                         previously seen  Lips:                  Previously seen        Kidneys:                Appear normal  Palate:                Previously             Bladder:                Appears normal                         visualized  Thoracic:              Appears normal         Spine:                  Not well visualized                                                                        (  Trv views normal)  Heart:                 Appears normal         Upper Extremities:      Previously seen                         (4CH, axis, and                         situs)  RVOT:                  Previously seen        Lower Extremities:      Previously seen  Other:  Female gender, Nasal bone, lenses, Heels/feet, open hands/5th digits          prev visualized. Technically difficult due to maternal  habitus and fetal          position. ---------------------------------------------------------------------- Doppler - Fetal Vessels  Umbilical Artery                                                              ADFV    RDFV                                                                Yes     Yes ---------------------------------------------------------------------- Cervix Uterus Adnexa  Cervix  Not visualized (advanced GA >24wks)  Uterus  No abnormality visualized.  Right Ovary  Within normal limits.  Left Ovary  Within normal limits.  Cul De Sac  No free fluid seen.  Adnexa  No adnexal mass visualized ---------------------------------------------------------------------- Comments  Bridget Scott has been followed due to severe IUGR.  She  is currently at 27 weeks and 6 days.  Her most recent EFW  performed 4 days ago was 522 g (1 pound 2 ounces, less  than the 1st percentile).  Intermittent absent end-diastolic flow  has also been noted on her umbilical artery Doppler studies.  She received a complete course of antenatal corticosteroids  last week.  She denies any problems since her last exam and reports  feeling fetal movements throughout the day.  Her blood pressures remain mildly elevated 157/81 and  141/82.  Intermittent reversed end-diastolic flow was noted on today's  umbilical artery Doppler studies.  There was normal amniotic  fluid noted.  Fetal movements were noted throughout today's ultrasound  exam.  She had an NST following today's ultrasound exam that did  not show any decelerations.  There was minimal variability  and  one 10 x 10 accelerations noted.  The increased risk of a fetal demise due to severe IUGR with  reversed end-diastolic flow was discussed.  After an extensive discussion, the patient and her husband  agreed to be admitted to the hospital later this evening for  continuous monitoring.  Once she is admitted, she should be placed on continuous  monitoring, receive magnesium sulfate for  fetal  neuroprotection, and receive a rescue course (2 more doses)  of betamethasone.  She should also have a NICU consult.  We will repeat the umbilical artery Doppler studies again in 2  days (on 'Sunday morning).  Should persistent reversed end-diastolic flow be noted on  that exam, delivery will be recommended.  Delivery is recommended at any time should persistent  decelerations be noted on her fetal heart rate tracing.  As the fetus is unlikely to tolerate the labor process, she  should probably be delivered via C-section.  The patient has stated that she would like a tubal ligation  performed at the time of her cesarean delivery.  She has  signed all necessary state consent forms for the tubal ligation  during her prenatal visits.  She understands that a tubal ligation is permanent.  She  would like the tubal ligation performed regardless of the  outcome of her current pregnancy.  She stated that all of her questions were answered today.  A total of 20 minutes was spent counseling and coordinating  the care for this patient.  Greater than 50% of the time was  spent in direct face-to-face contact. ----------------------------------------------------------------------                  Victor Fang, MD Electronically Signed Final Report   03/26/2022 05:32 pm ----------------------------------------------------------------------  US MFM UA CORD DOPPLER  Result Date: 03/24/2022 ----------------------------------------------------------------------  OBSTETRICS REPORT                       (Signed Final 03/24/2022 05:35 pm) ---------------------------------------------------------------------- Patient Info  ID #:       [id number]                          D.O.B.:  10/25/1980 (41 yrs)  Name:       Aleatha M Bridge                Visit Date: 03/24/2022 02:58 pm ---------------------------------------------------------------------- Performed By  Attending:        Burk Schaible DO       Ref. Address:     [address]  Performed By:     Dorena Dew     [facility]                    BS, RDMS                                 [facility] at                                                             [facility]  Referred By:      [facility] ---------------------------------------------------------------------- Orders  #  Description                           Code        Ordered By  1  [place] MFM UA CORD DOPPLER                [phone]    Peterson Ao  2  [place] MFM OB LIMITED                     [id number]    YU FANG ----------------------------------------------------------------------  #  Order #                     Accession #                Episode #  1  [id number]                   [id number]                 [id number]  2  [id number]                   [id number]                 [id number] ---------------------------------------------------------------------- Indications  Maternal care for known or suspected poor      O36.5920  fetal growth, second trimester, not applicable  or unspecified IUGR  Absent end diastolic flow (Fetal and           O36.90X0  placental problem in pregnancy)  [redacted] weeks gestation of pregnancy                Z3A.27  Hypertension - Chronic/Pre-existing            O10.019  (Procardia)  Pre-existing diabetes, type 2, in pregnancy,   O24.112  second trimester (metformin, insulin)  Advanced maternal age multigravida 66+,        O38.522  second trimester (72 yrs)  Obesity complicating pregnancy, second         O99.212  trimester (Pre-G BMI 30.3)  Pre-existing essential hypertension            0000000  complicating pregnancy, second trimester  (procardia)  Cervical cerclage suture present, second       O34.32  trimester (12/21/21)  Asthma                                         O99.89 j45.909  Poor obstetric history: Previous preterm       O09.219  delivery, antepartum  (PPROM, 20wks)  LR NIPS/AFP Neg/Horizon Neg  Encounter for other antenatal screening        Z36.2  follow-up ---------------------------------------------------------------------- Fetal Evaluation  Num Of Fetuses:         1  Fetal Heart Rate(bpm):  141  Cardiac Activity:       Observed  Presentation:           Cephalic  Placenta:               Posterior  P. Cord Insertion:      Previously visualized  Amniotic Fluid  AFI FV:      Subjectively low-normal  Largest Pocket(cm)                              3.44 ---------------------------------------------------------------------- OB History  Gravidity:    3         Term:   0        Prem:   1        SAB:   1  TOP:          0       Ectopic:  0        Living: 1 ---------------------------------------------------------------------- Gestational Age  LMP:           29w 5d        Date:  08/28/21                   EDD:   06/04/22  Best:          27w 4d     Det. By:  U/S  (12/23/21)          EDD:   06/19/22 ---------------------------------------------------------------------- Doppler - Fetal Vessels  Umbilical Artery   S/D     %tile      RI    %tile      PI    %tile     PSV    ADFV    RDFV                                                     (cm/s)   5.81   > 97.5    0.83   > 97.5    1.89   > 97.5    40.37     Yes      No  Comment:    Intermittent absent EDF ---------------------------------------------------------------------- Comments  MFM Consult Note  Patient Name: CHENICE DOMENICK  Patient MRN:   [id number]  Referring provider: Osborne Oman, MD  [address]  [place],  [place] [address]  Reason for Consult: FGR  HPI: VERNESE VANDERPOEL is a 42 y.o. G3P0110 at [redacted]w[redacted]d here for ultrasound and consultation.  The patient was seen in the office today for fetal growth  restriction.  She has been offered admission due to  intermittent absent reversed end-diastolic flow on previous  ultrasounds but declined due to child care.  Today her blood  pressures are  elevated in the 160s over 80s with a repeat in  the 150s over 90s.  She is asymptomatic from a  preeclampsia perspective.  I discussed that if she is able to  go to the MAU and has a normal fetal tracing for at least 1 to  2 hours in addition to resolution of her high range blood  pressures and normal labs then consideration of discharge  can be done.  However, I also discussed that admission to  the hospital is very reasonable given the increased risk of  stillbirth.  If she is not admitted she should have twice weekly  outpatient antenatal testing.  I notified  the MAU provider  regarding her arrival.  We discussed at length that there is no  strong evidence to say that hospitalization of intermittent  absent end-diastolic flow with fetal growth restriction  improved outcomes.  I also discussed there is a social and  medical cost association with admission to the hospital.  I  also discussed that this does allow for more frequent  monitoring and may decrease the risk of stillbirth although  this is unproven.  I discussed that ultimately I would defer to  the patient's comfort level and support her decision.  She is  going to make her decision after she is evaluated at the MAU.  Review of Systems: A review of systems was performed and  was negative except per HPI  Vitals and Physical Exam  BP 160/88 with a repeat of 150/90, HR 69, pregravid BMI:  30.2.  Sitting comfortably on the sonogram table  Nonlabored breathing  Normal rate and rhythm  Abdomen is nontender  Genetic testing: lwo risk NIPS  Sonographic findings:  Single intrauterine pregnancy.  Fetal cardiac activity:  Observed and appears normal.  Presentation: Cephalic.  Interval fetal anatomy appears normal.  Amniotic fluid volume: Subjectively low-norma. MVP: 3.44 cm.  Placenta: Posterior.  Umbilical artery dopplers findings: Intermittent absent end-  diastolic flow.  No reversed flow.  Assessment  - FGR, periods of end diastolic flow  - Elevated BP  Plan   -Sent to the MAU for preeclampsia rule out and consideration  of admission due to end-diastolic flow seen intermittently on  ultrasound  -She should have 1 to 2 hours of fetal heart tracing  -Preeclampsia labs (CBC, CMP, UPC)  -Consideration of admission if the blood pressure, laboratory  evaluation or fetal heart tracing is abnormal. ----------------------------------------------------------------------                  Valeda Malm, DO Electronically Signed Final Report   03/24/2022 05:35 pm ----------------------------------------------------------------------  [place] MFM OB LIMITED  Result Date: 03/24/2022 ----------------------------------------------------------------------  OBSTETRICS REPORT                       (Signed Final 03/24/2022 05:35 pm) ---------------------------------------------------------------------- Patient Info  ID #:       [id number]                          D.O.B.:  1981/01/06 (41 yrs)  Name:       Bridget Scott                Visit Date: 03/24/2022 02:58 pm ---------------------------------------------------------------------- Performed By  Attending:        Valeda Malm DO       Ref. Address:     [address]  Performed By:     Dorena Dew     Location[facility]                    BS, RDMS                                 [facility] at  [facility]                                                             [facility]  Referred By:      [facility] ---------------------------------------------------------------------- Orders  #  Description                           Code        Ordered By  1  [place] MFM UA CORD DOPPLER                [phone]    Peterson Ao  2  [place] MFM OB LIMITED                     [id number]    YU FANG ----------------------------------------------------------------------  #  Order #                     Accession #                 Episode #  1  [id number]                   [id number]                 [id number]  2  [id number]                   [id number]                 [id number] ---------------------------------------------------------------------- Indications  Maternal care for known or suspected poor      O36.5920  fetal growth, second trimester, not applicable  or unspecified IUGR  Absent end diastolic flow (Fetal and           O36.90X0  placental problem in pregnancy)  [redacted] weeks gestation of pregnancy                Z3A.27  Hypertension - Chronic/Pre-existing            O10.019  (Procardia)  Pre-existing diabetes, type 2, in pregnancy,   O24.112  second trimester (metformin, insulin)  Advanced maternal age multigravida 11+,        O50.522  second trimester (67 yrs)  Obesity complicating pregnancy, second         O99.212  trimester (Pre-G BMI 30.3)  Pre-existing essential hypertension            0000000  complicating pregnancy, second trimester  (procardia)  Cervical cerclage suture present, second       O34.32  trimester (12/21/21)  Asthma                                         O99.89 j45.909  Poor obstetric history: Previous preterm       O09.219  delivery, antepartum (PPROM, 20wks)  LR NIPS/AFP Neg/Horizon Neg  Encounter for other antenatal screening        Z36.2  follow-up ---------------------------------------------------------------------- Fetal Evaluation  Num Of Fetuses:         1  Fetal Heart Rate(bpm):  141  Cardiac Activity:  Observed  Presentation:           Cephalic  Placenta:               Posterior  P. Cord Insertion:      Previously visualized  Amniotic Fluid  AFI FV:      Subjectively low-normal                              Largest Pocket(cm)                              3.44 ---------------------------------------------------------------------- OB History  Gravidity:    3         Term:   0        Prem:   1        SAB:   1  TOP:          0       Ectopic:  0        Living: 1  ---------------------------------------------------------------------- Gestational Age  LMP:           29w 5d        Date:  08/28/21                   EDD:   06/04/22  Best:          27w 4d     Det. By:  U/S  (12/23/21)          EDD:   06/19/22 ---------------------------------------------------------------------- Doppler - Fetal Vessels  Umbilical Artery   S/D     %tile      RI    %tile      PI    %tile     PSV    ADFV    RDFV                                                     (cm/s)   5.81   > 97.5    0.83   > 97.5    1.89   > 97.5    40.37     Yes      No  Comment:    Intermittent absent EDF ---------------------------------------------------------------------- Comments  MFM Consult Note  Patient Name: GLENDEAN ONEILL  Patient MRN:   [id number]  Referring provider: Osborne Oman, MD  [address]  [place],  [place] [address]  Reason for Consult: FGR  HPI: FLOREINE LEIER is a 43 y.o. G3P0110 at [redacted]w[redacted]d here for ultrasound and consultation.  The patient was seen in the office today for fetal growth  restriction.  She has been offered admission due to  intermittent absent reversed end-diastolic flow on previous  ultrasounds but declined due to child care.  Today her blood  pressures are elevated in the 160s over 80s with a repeat in  the 150s over 90s.  She is asymptomatic from a  preeclampsia perspective.  I discussed that if she is able to  go to the MAU and has a normal fetal tracing for at least 1 to  2 hours in addition to resolution of her high range blood  pressures and normal labs then consideration of discharge  can be done.  However, I also discussed that admission to  the hospital is very reasonable given the increased risk of  stillbirth.  If she is not admitted she should have twice weekly  outpatient antenatal testing.  I notified  the MAU provider  regarding her arrival.  We discussed at length that there is no  strong evidence to say that hospitalization of intermittent  absent end-diastolic flow with  fetal growth restriction  improved outcomes.  I also discussed there is a social and  medical cost association with admission to the hospital.  I  also discussed that this does allow for more frequent  monitoring and may decrease the risk of stillbirth although  this is unproven.  I discussed that ultimately I would defer to  the patient's comfort level and support her decision.  She is  going to make her decision after she is evaluated at the MAU.  Review of Systems: A review of systems was performed and  was negative except per HPI  Vitals and Physical Exam  BP 160/88 with a repeat of 150/90, HR 69, pregravid BMI:  30.2.  Sitting comfortably on the sonogram table  Nonlabored breathing  Normal rate and rhythm  Abdomen is nontender  Genetic testing: lwo risk NIPS  Sonographic findings:  Single intrauterine pregnancy.  Fetal cardiac activity:  Observed and appears normal.  Presentation: Cephalic.  Interval fetal anatomy appears normal.  Amniotic fluid volume: Subjectively low-norma. MVP: 3.44 cm.  Placenta: Posterior.  Umbilical artery dopplers findings: Intermittent absent end-  diastolic flow.  No reversed flow.  Assessment  - FGR, periods of end diastolic flow  - Elevated BP  Plan  -Sent to the MAU for preeclampsia rule out and consideration  of admission due to end-diastolic flow seen intermittently on  ultrasound  -She should have 1 to 2 hours of fetal heart tracing  -Preeclampsia labs (CBC, CMP, UPC)  -Consideration of admission if the blood pressure, laboratory  evaluation or fetal heart tracing is abnormal. ----------------------------------------------------------------------                  Valeda Malm, DO Electronically Signed Final Report   03/24/2022 05:35 pm ----------------------------------------------------------------------  [place] MFM UA CORD DOPPLER  Result Date: 03/22/2022 ----------------------------------------------------------------------  OBSTETRICS REPORT                       (Signed  Final 03/22/2022 02:15 pm) ---------------------------------------------------------------------- Patient Info  ID #:       [id number]                          D.O.B.:  November 29, 1980 (41 yrs)  Name:       Bridget Scott                Visit Date: 03/22/2022 09:00 am ---------------------------------------------------------------------- Performed By  Attending:        Johnell Comings MD         Ref. Address:     [place]  Performed By:     Jacob Moores BS,       Location[facility]  [facility]                    RDMS, RVT                                [facility]etal [facility] at                                                             [place] [facility]                                                             [facility]  Referred By:      [facility] ---------------------------------------------------------------------- Orders  #  Description                           Code        Ordered By  1  [place] MFM OB LIMITED                     [phone]    Peterson Ao  2  [place] MFM UA CORD DOPPLER                [id number]    YU FANG ----------------------------------------------------------------------  #  Order #                     Accession #                Episode #  1  [id number]                   [id number]                 [id number]  2  [id number]                   [id number]                 [id number] ---------------------------------------------------------------------- Indications  Maternal care for known or suspected poor      O36.5920  fetal growth, second trimester, not applicable  or unspecified IUGR  Hypertension - Chronic/Pre-existing            O10.019  (Procardia)  Pre-existing diabetes, type 2, in pregnancy,   O24.112  second trimester (metformin, insulin)  Advanced maternal age multigravida 79+,        O62.522  second trimester (41 yrs)  Obesity complicating pregnancy, second         O99.212  trimester (Pre-G BMI 30.3)  Pre-existing essential hypertension             0000000  complicating pregnancy, second trimester  (procardia)  [redacted] weeks gestation of pregnancy                Z3A.27  Cervical cerclage suture present, second       O34.32  trimester (12/21/21)  Asthma  O99.89 j45.909  Poor obstetric history: Previous preterm       O09.219  delivery, antepartum (PPROM, 20wks)  LR NIPS/AFP Neg/Horizon Neg  Encounter for other antenatal screening        Z36.2  follow-up ---------------------------------------------------------------------- Fetal Evaluation  Num Of Fetuses:         1  Fetal Heart Rate(bpm):  155  Cardiac Activity:       Observed  Presentation:           Cephalic  Placenta:               Posterior  P. Cord Insertion:      Previously visualized  Amniotic Fluid  AFI FV:      Within normal limits                              Largest Pocket(cm)                              3.41 ---------------------------------------------------------------------- Biometry  BPD:      60.7  mm     G. Age:  24w 5d        < 1  %    CI:        76.24   %    70 - 86                                                          FL/HC:      15.9   %    18.6 - 20.4  HC:      220.3  mm     G. Age:  24w 0d        < 1  %    HC/AC:      1.18        1.05 - 1.21  AC:      186.5  mm     G. Age:  23w 3d        < 1  %    FL/BPD:     57.7   %    71 - 87  FL:         35  mm     G. Age:  21w 0d        < 1  %    FL/AC:      18.8   %    20 - 24  LV:        7.8  mm  Est. FW:     522  gm      1 lb 2 oz    < 1  % ---------------------------------------------------------------------- OB History  Gravidity:    3         Term:   0        Prem:   1        SAB:   1  TOP:          0       Ectopic:  0        Living: 1 ---------------------------------------------------------------------- Gestational Age  LMP:           29w 3d  Date:  08/28/21                   EDD:   06/04/22  U/S Today:     23w 2d                                        EDD:   07/17/22  Best:          27w 2d     Det.  By:  U/S  (12/23/21)          EDD:   06/19/22 ---------------------------------------------------------------------- Anatomy  Cranium:               Previously seen        LVOT:                   Previously seen  Cavum:                 Previously             Aortic Arch:            Previously seen                         visualized  Ventricles:            Appears normal         Ductal Arch:            Previously seen  Choroid Plexus:        Previously seen        Diaphragm:              Appears normal  Cerebellum:            Previously seen        Stomach:                Appears normal, left                                                                        sided  Posterior Fossa:       Previously seen        Abdomen:                Previously seen  Nuchal Fold:           Previously seen        Abdominal Wall:         Previously seen  Face:                  Profile nl; orbits     Cord Vessels:           Previously seen                         prev visualized  Lips:                  Previously seen        Kidneys:                Appear  normal  Palate:                Previously             Bladder:                Appears normal                         visualized  Thoracic:              Appears normal         Spine:                  Not well visualized                                                                        (Trv views normal)  Heart:                 Appears normal         Upper Extremities:      Previously seen                         (4CH, axis, and                         situs)  RVOT:                  Previously seen        Lower Extremities:      Previously seen  Other:  Fetus appears to be a female. Nasal bone, lenses, Heels/feet and open          hands/5th digits prev visualized. Technically difficult due to maternal          habitus and fetal position. ---------------------------------------------------------------------- Doppler - Fetal Vessels  Umbilical Artery   S/D     %tile      RI    %tile       PI    %tile     PSV    ADFV    RDFV                                                     (cm/s)  11.25   > 97.5    0.91   > 97.5    2.27   > 97.5    37.04     Yes      No ---------------------------------------------------------------------- Cervix Uterus Adnexa  Cervix  Not visualized (advanced GA >24wks)  Uterus  No abnormality visualized.  Right Ovary  Within normal limits.  Left Ovary  Not visualized.  Cul De Sac  No free fluid seen.  Adnexa  No adnexal mass visualized ---------------------------------------------------------------------- Comments  This patient was seen due to a severely IUGR fetus.  She  denies any problems since her last exam and reports feeling  vigorous fetal movements throughout the day.  She received a complete course of antenatal corticosteroids  on February 23 and  24, 2024.  On today's exam, the EFW of 522 g (1 pound 2 ounces)  measures at the < 1  percentile for her gestational age  indicating severe IUGR.  There was normal amniotic fluid noted on today's exam.  Fetal movements were noted throughout today's exam.  Doppler studies of the umbilical arteries performed today  shows intermittent absent end-diastolic flow.  The plan last week was for the patient to be admitted today  for daily fetal testing.  However, the patient reports that her mother-in-law who was  supposed to watch their 58-year-old autistic child fell over the  weekend and is currently injured and cannot watch the child.  There is no one else to watch the child and therefore she  cannot be hospitalized.  The increased risk of a fetal demise due to severe IUGR with  intermittent absent end-diastolic flow was discussed again.  Fetal kick count instructions were reviewed.  As the patient cannot be hospitalized, she will return to our  office this Wednesday and Friday for another umbilical artery  Doppler study and NST.  She will consider hospitalization  next week once she reaches 28 weeks.  Should reversed end-diastolic  flow be noted, delivery may be  considered at 28 weeks or greater in order to avoid a fetal  demise.  The patient stated that she will have a NICU consult  once she is hospitalized.  She will return in 2 days for an NST and umbilical artery  Doppler study. ----------------------------------------------------------------------                   Johnell Comings, MD Electronically Signed Final Report   03/22/2022 02:15 pm ----------------------------------------------------------------------  [place] MFM OB LIMITED  Result Date: 03/22/2022 ----------------------------------------------------------------------  OBSTETRICS REPORT                       (Signed Final 03/22/2022 02:15 pm) ---------------------------------------------------------------------- Patient Info  ID #:       [id number]                          D.O.B.:  03/21/1980 (41 yrs)  Name:       Bridget Scott                Visit Date: 03/22/2022 09:00 am ---------------------------------------------------------------------- Performed By  Attending:        Johnell Comings MD         Ref. Address:     [place]  Performed By:     Jacob Moores BS,       [facility]                    RDMS, RVT                                [facility]                                                             [facility]  Referred By:      [facility] ---------------------------------------------------------------------- Orders  #  Description                           Code        Ordered By  1  [place] MFM OB LIMITED                     [phone]    Peterson Ao  2  [place] MFM UA CORD DOPPLER                [id number]    YU FANG ----------------------------------------------------------------------  #  Order #                     Accession #                Episode #  1  [id number]                   [id number]  2  [id number]                   [id number]                 [id number] ---------------------------------------------------------------------- Indications  Maternal care for known or suspected poor      O36.5920  fetal growth, second trimester, not applicable  or unspecified IUGR  Hypertension - Chronic/Pre-existing            O10.019  (Procardia)  Pre-existing diabetes, type 2, in pregnancy,   O24.112  second trimester (metformin, insulin)  Advanced maternal age multigravida 53+,        O21.522  second trimester (18 yrs)  Obesity complicating pregnancy, second         O99.212  trimester (Pre-G BMI 30.3)  Pre-existing essential hypertension            0000000  complicating pregnancy, second trimester  (procardia)  [redacted] weeks gestation of pregnancy                Z3A.27  Cervical cerclage suture present, second       O34.32  trimester (12/21/21)  Asthma                                         O99.89 j45.909  Poor obstetric history: Previous preterm       O09.219  delivery, antepartum (PPROM, 20wks)  LR NIPS/AFP Neg/Horizon Neg  Encounter for other antenatal screening        Z36.2  follow-up ---------------------------------------------------------------------- Fetal Evaluation  Num Of Fetuses:         1  Fetal Heart Rate(bpm):  155  Cardiac Activity:       Observed  Presentation:           Cephalic  Placenta:  Posterior  P. Cord Insertion:      Previously visualized  Amniotic Fluid  AFI FV:      Within normal limits                              Largest Pocket(cm)                              3.41 ---------------------------------------------------------------------- Biometry  BPD:      60.7  mm     G. Age:  24w 5d        < 1  %    CI:        76.24   %    70 - 86                                                          FL/HC:      15.9   %    18.6 - 20.4  HC:      220.3  mm     G. Age:  24w 0d        < 1  %    HC/AC:      1.18        1.05 - 1.21  AC:      186.5  mm     G. Age:  23w 3d        < 1  %     FL/BPD:     57.7   %    71 - 87  FL:         35  mm     G. Age:  21w 0d        < 1  %    FL/AC:      18.8   %    20 - 24  LV:        7.8  mm  Est. FW:     522  gm      1 lb 2 oz    < 1  % ---------------------------------------------------------------------- OB History  Gravidity:    3         Term:   0        Prem:   1        SAB:   1  TOP:          0       Ectopic:  0        Living: 1 ---------------------------------------------------------------------- Gestational Age  LMP:           29w 3d        Date:  08/28/21                   EDD:   06/04/22  U/S Today:     23w 2d                                        EDD:   07/17/22  Best:          27w 2d     Det. By:  U/S  (12/23/21)  EDD:   06/19/22 ---------------------------------------------------------------------- Anatomy  Cranium:               Previously seen        LVOT:                   Previously seen  Cavum:                 Previously             Aortic Arch:            Previously seen                         visualized  Ventricles:            Appears normal         Ductal Arch:            Previously seen  Choroid Plexus:        Previously seen        Diaphragm:              Appears normal  Cerebellum:            Previously seen        Stomach:                Appears normal, left                                                                        sided  Posterior Fossa:       Previously seen        Abdomen:                Previously seen  Nuchal Fold:           Previously seen        Abdominal Wall:         Previously seen  Face:                  Profile nl; orbits     Cord Vessels:           Previously seen                         prev visualized  Lips:                  Previously seen        Kidneys:                Appear normal  Palate:                Previously             Bladder:                Appears normal                         visualized  Thoracic:              Appears normal         Spine:  Not well visualized                                                                         (Trv views normal)  Heart:                 Appears normal         Upper Extremities:      Previously seen                         (4CH, axis, and                         situs)  RVOT:                  Previously seen        Lower Extremities:      Previously seen  Other:  Fetus appears to be a female. Nasal bone, lenses, Heels/feet and open          hands/5th digits prev visualized. Technically difficult due to maternal          habitus and fetal position. ---------------------------------------------------------------------- Doppler - Fetal Vessels  Umbilical Artery   S/D     %tile      RI    %tile      PI    %tile     PSV    ADFV    RDFV                                                     (cm/s)  11.25   > 97.5    0.91   > 97.5    2.27   > 97.5    37.04     Yes      No ---------------------------------------------------------------------- Cervix Uterus Adnexa  Cervix  Not visualized (advanced GA >24wks)  Uterus  No abnormality visualized.  Right Ovary  Within normal limits.  Left Ovary  Not visualized.  Cul De Sac  No free fluid seen.  Adnexa  No adnexal mass visualized ---------------------------------------------------------------------- Comments  This patient was seen due to a severely IUGR fetus.  She  denies any problems since her last exam and reports feeling  vigorous fetal movements throughout the day.  She received a complete course of antenatal corticosteroids  on February 23 and 24, 2024.  On today's exam, the EFW of 522 g (1 pound 2 ounces)  measures at the < 1  percentile for her gestational age  indicating severe IUGR.  There was normal amniotic fluid noted on today's exam.  Fetal movements were noted throughout today's exam.  Doppler studies of the umbilical arteries performed today  shows intermittent absent end-diastolic flow.  The plan last week was for the patient to be admitted today  for daily fetal testing.  However, the patient reports  that her mother-in-law who was  supposed to watch their 57-year-old autistic child fell over the  weekend and is currently injured and cannot watch the child.  There  is no one else to watch the child and therefore she  cannot be hospitalized.  The increased risk of a fetal demise due to severe IUGR with  intermittent absent end-diastolic flow was discussed again.  Fetal kick count instructions were reviewed.  As the patient cannot be hospitalized, she will return to our  office this Wednesday and Friday for another umbilical artery  Doppler study and NST.  She will consider hospitalization  next week once she reaches 28 weeks.  Should reversed end-diastolic flow be noted, delivery may be  considered at 28 weeks or greater in order to avoid a fetal  demise.  The patient stated that she will have a NICU consult  once she is hospitalized.  She will return in 2 days for an NST and umbilical artery  Doppler study. ----------------------------------------------------------------------                   Johnell Comings, MD Electronically Signed Final Report   03/22/2022 02:15 pm ----------------------------------------------------------------------  [place] MFM OB FOLLOW UP  Result Date: 03/18/2022 ----------------------------------------------------------------------  OBSTETRICS REPORT                       (Signed Final 03/18/2022 05:27 pm) ---------------------------------------------------------------------- Patient Info  ID #:       [id number]                          D.O.B.:  [birth year] (41 yrs)  Name:       Bridget Scott                Visit Date: 03/18/2022 03:40 pm ---------------------------------------------------------------------- Performed By  Attending:        Tama High MD        Ref. Address:     [place]  Performed By:     Germain Osgood            [facility]  Referred By:  [place] ---------------------------------------------------------------------- Orders  #  Description                           Code        Ordered By  1  [place] MFM OB FOLLOW UP                   [id number]    Peterson Ao  2  [place] MFM UA CORD DOPPLER                [id number]    YU FANG ----------------------------------------------------------------------  #  Order #                     Accession #                Episode #  1  [id number]                   [id number]                 [id number]  2  [id number]                   [id number]                 [id number] ---------------------------------------------------------------------- Indications  Maternal care for known or suspected poor      O36.5920  fetal growth, second trimester, not applicable  or unspecified IUGR  Hypertension - Chronic/Pre-existing            O10.019  (Procardia)  Advanced maternal age multigravida 7+,        O23.522  second trimester (32 yrs)  Pre-existing diabetes, type 2, in pregnancy,   O24.112  second trimester (metformin, insulin)  Obesity complicating pregnancy, second         O99.212  trimester (Pre-G BMI 30.3)  Cervical cerclage suture present, second       O34.32  trimester (12/21/21)  Pre-existing essential hypertension            0000000  complicating pregnancy, second trimester  (procardia)  Asthma                                         O99.89 j45.909  [redacted] weeks gestation of pregnancy                Z3A.26  Poor obstetric history: Previous preterm       O09.219  delivery, antepartum (PPROM, 20wks)  LR NIPS/AFP Neg/Horizon Neg ---------------------------------------------------------------------- Vital Signs  BP:          147/84 ---------------------------------------------------------------------- Fetal Evaluation  Num Of Fetuses:         1  Fetal Heart Rate(bpm):   141  Cardiac Activity:       Observed  Presentation:           Breech  Placenta:               Posterior  P. Cord Insertion:      Previously visualized  Amniotic Fluid  AFI FV:      Within normal limits                              Largest Pocket(cm)  3.61 ---------------------------------------------------------------------- Biometry  BPD:      58.8  mm     G. Age:  24w 0d        < 1  %    CI:        76.24   %    70 - 86                                                          FL/HC:      15.9   %    18.6 - 20.4  HC:      213.4  mm     G. Age:  23w 3d        < 1  %    HC/AC:      1.25        1.05 - 1.21  AC:      171.2  mm     G. Age:  22w 1d        < 1  %    FL/BPD:     57.7   %    71 - 87  FL:       33.9  mm     G. Age:  20w 5d        < 1  %    FL/AC:      19.8   %    20 - 24  HUM:      28.7  mm     G. Age:  19w 2d        < 5  %  LV:        2.8  mm  Est. FW:     446  gm           1 lb    < 1  % ---------------------------------------------------------------------- OB History  Gravidity:    3         Term:   0        Prem:   1        SAB:   1  TOP:          0       Ectopic:  0        Living: 1 ---------------------------------------------------------------------- Gestational Age  LMP:           28w 6d        Date:  08/28/21                   EDD:   06/04/22  U/S Today:     22w 4d                                        EDD:   07/18/22  Best:          26w 5d     Det. By:  U/S  (12/23/21)          EDD:   06/19/22 ---------------------------------------------------------------------- Anatomy  Cranium:               Appears normal         LVOT:  Appears normal  Cavum:                 Previously             Aortic Arch:            Previously seen                         visualized  Ventricles:            Appears normal         Ductal Arch:            Previously seen  Choroid Plexus:        Previously seen        Diaphragm:              Appears normal  Cerebellum:             Previously seen        Stomach:                Appears normal, left                                                                        sided  Posterior Fossa:       Previously seen        Abdomen:                Appears normal  Nuchal Fold:           Previously seen        Abdominal Wall:         Previously seen  Face:                  Profile nl; orbits     Cord Vessels:           Previously seen                         prev visualized  Lips:                  Previously seen        Kidneys:                Appear normal  Palate:                Appears normal         Bladder:                Appears normal  Thoracic:              Appears normal         Spine:                  Not well visualized                                                                        (  Trv views normal)  Heart:                 Appears normal         Upper Extremities:      Previously seen                         (4CH, axis, and                         situs)  RVOT:                  Previously seen        Lower Extremities:      Previously seen  Other:  Fetus appears to be a female. Nasal bone, lenses, Heels/feet and open          hands/5th digits prev visualized. Technically difficult due to maternal          habitus and fetal position. ---------------------------------------------------------------------- Doppler - Fetal Vessels  Umbilical Artery   S/D     %tile      RI    %tile      PI    %tile     PSV    ADFV    RDFV                                                     (cm/s)  12.21   > 97.5    0.92   > 97.5    1.98   > 97.5    36.91     Yes      No ---------------------------------------------------------------------- Cervix Uterus Adnexa  Cervix  Not visualized (advanced GA >24wks)  Uterus  No abnormality visualized.  Right Ovary  Size(cm)     2.58   x   1.85   x  1.64      Vol(ml): 4.1  Within normal limits.  Left Ovary  Not visualized.  Cul De Sac  No free fluid seen.  Adnexa  No adnexal mass visualized  ---------------------------------------------------------------------- Impression  Severe fetal growth restriction with abnormal Doppler studies.  Patient return for fetal growth assessment.  She has type 2 diabetes and takes insulin and metformin.  She reports her blood glucose levels are within normal range.  Chronic hypertension.  Patient takes nifedipine XL 60 mg  daily.  Patient had prophylactic cerclage in this pregnancy because  of history of midtrimester miscarriage.  On today's ultrasound, the estimated fetal weight and the  abdominal circumference measurements are at the 1st  percentile.  Head circumference measurement is at -2 SD.  Interval weight gain is 78 g.  Umbilical artery Doppler showed  intermittent absent end-diastolic flow.  I counseled the couple on the poor fetal weight gain.  I recommended inpatient management because of her  multiple medical conditions.  Antenatal corticosteroids can be  given as an inpatient and neonatal consultation will be  obtained to discuss the prognosis of the newborn.  Initially, we plan to give antenatal corticosteroids at our office.  I offered admission tomorrow.  Patient would like to defer  antenatal corticosteroids till her admission.  She preferred to  be admitted on Monday.  Patient will be returning on Monday morning for UA Doppler  studies. ---------------------------------------------------------------------- Recommendations  -UA  Doppler on 03/22/22.  -Admit Ob Specialty on 03/22/22.  -Betamethasone course.  -Neonatal consultation.  -Daily NST  -Twice-weekly UA Doppler or more frequently as needed.  Suanne Marker message sent to Dr. Harolyn Rutherford and Dr. Elgie Congo  (admitting attending) ----------------------------------------------------------------------                 Tama High, MD Electronically Signed Final Report   03/18/2022 05:27 pm ----------------------------------------------------------------------  [place] MFM UA CORD DOPPLER  Result Date:  03/18/2022 ----------------------------------------------------------------------  OBSTETRICS REPORT                       (Signed Final 03/18/2022 05:27 pm) ---------------------------------------------------------------------- Patient Info  ID #:       [id number]                          D.O.B.:  October 03, 1980 (41 yrs)  Name:       Bridget Scott                Visit Date: 03/18/2022 03:40 pm ---------------------------------------------------------------------- Performed By  Attending:        Tama High MD        Ref. Address:     [place]  Performed By:     Germain Osgood            [facility]                                     [facility]                                                             [place] [facility]                                                             [facility]  Referred By:      [facility] ---------------------------------------------------------------------- Orders  #  Description                           Code        Ordered By  1  [place] MFM OB FOLLOW UP                   [id number]    YU FANG  2  [place] MFM UA CORD DOPPLER  [id number]    YU FANG ----------------------------------------------------------------------  #  Order #                     Accession #                Episode #  1  [id number]                   [id number]                 [id number]  2  [id number]                   [id number]                 [id number] ---------------------------------------------------------------------- Indications  Maternal care for known or suspected poor      O36.5920  fetal growth, second trimester, not applicable  or unspecified IUGR  Hypertension - Chronic/Pre-existing            O10.019  (Procardia)  Advanced maternal age multigravida 69+,        O46.522  second trimester (26 yrs)  Pre-existing diabetes, type 2, in pregnancy,   O24.112  second trimester (metformin, insulin)   Obesity complicating pregnancy, second         O99.212  trimester (Pre-G BMI 30.3)  Cervical cerclage suture present, second       O34.32  trimester (12/21/21)  Pre-existing essential hypertension            0000000  complicating pregnancy, second trimester  (procardia)  Asthma                                         O99.89 j45.909  [redacted] weeks gestation of pregnancy                Z3A.26  Poor obstetric history: Previous preterm       O09.219  delivery, antepartum (PPROM, 20wks)  LR NIPS/AFP Neg/Horizon Neg ---------------------------------------------------------------------- Vital Signs  BP:          147/84 ---------------------------------------------------------------------- Fetal Evaluation  Num Of Fetuses:         1  Fetal Heart Rate(bpm):  141  Cardiac Activity:       Observed  Presentation:           Breech  Placenta:               Posterior  P. Cord Insertion:      Previously visualized  Amniotic Fluid  AFI FV:      Within normal limits                              Largest Pocket(cm)                              3.61 ---------------------------------------------------------------------- Biometry  BPD:      58.8  mm     G. Age:  24w 0d        < 1  %    CI:        76.24   %    70 - 86  FL/HC:      15.9   %    18.6 - 20.4  HC:      213.4  mm     G. Age:  23w 3d        < 1  %    HC/AC:      1.25        1.05 - 1.21  AC:      171.2  mm     G. Age:  22w 1d        < 1  %    FL/BPD:     57.7   %    71 - 87  FL:       33.9  mm     G. Age:  20w 5d        < 1  %    FL/AC:      19.8   %    20 - 24  HUM:      28.7  mm     G. Age:  19w 2d        < 5  %  LV:        2.8  mm  Est. FW:     446  gm           1 lb    < 1  % ---------------------------------------------------------------------- OB History  Gravidity:    3         Term:   0        Prem:   1        SAB:   1  TOP:          0       Ectopic:  0        Living: 1  ---------------------------------------------------------------------- Gestational Age  LMP:           28w 6d        Date:  08/28/21                   EDD:   06/04/22  U/S Today:     22w 4d                                        EDD:   07/18/22  Best:          26w 5d     Det. By:  U/S  (12/23/21)          EDD:   06/19/22 ---------------------------------------------------------------------- Anatomy  Cranium:               Appears normal         LVOT:                   Appears normal  Cavum:                 Previously             Aortic Arch:            Previously seen                         visualized  Ventricles:            Appears normal         Ductal Arch:  Previously seen  Choroid Plexus:        Previously seen        Diaphragm:              Appears normal  Cerebellum:            Previously seen        Stomach:                Appears normal, left                                                                        sided  Posterior Fossa:       Previously seen        Abdomen:                Appears normal  Nuchal Fold:           Previously seen        Abdominal Wall:         Previously seen  Face:                  Profile nl; orbits     Cord Vessels:           Previously seen                         prev visualized  Lips:                  Previously seen        Kidneys:                Appear normal  Palate:                Appears normal         Bladder:                Appears normal  Thoracic:              Appears normal         Spine:                  Not well visualized                                                                        (Trv views normal)  Heart:                 Appears normal         Upper Extremities:      Previously seen                         (4CH, axis, and                         situs)  RVOT:  Previously seen        Lower Extremities:      Previously seen  Other:  Fetus appears to be a female. Nasal bone, lenses, Heels/feet and open          hands/5th digits  prev visualized. Technically difficult due to maternal          habitus and fetal position. ---------------------------------------------------------------------- Doppler - Fetal Vessels  Umbilical Artery   S/D     %tile      RI    %tile      PI    %tile     PSV    ADFV    RDFV                                                     (cm/s)  12.21   > 97.5    0.92   > 97.5    1.98   > 97.5    36.91     Yes      No ---------------------------------------------------------------------- Cervix Uterus Adnexa  Cervix  Not visualized (advanced GA >24wks)  Uterus  No abnormality visualized.  Right Ovary  Size(cm)     2.58   x   1.85   x  1.64      Vol(ml): 4.1  Within normal limits.  Left Ovary  Not visualized.  Cul De Sac  No free fluid seen.  Adnexa  No adnexal mass visualized ---------------------------------------------------------------------- Impression  Severe fetal growth restriction with abnormal Doppler studies.  Patient return for fetal growth assessment.  She has type 2 diabetes and takes insulin and metformin.  She reports her blood glucose levels are within normal range.  Chronic hypertension.  Patient takes nifedipine XL 60 mg  daily.  Patient had prophylactic cerclage in this pregnancy because  of history of midtrimester miscarriage.  On today's ultrasound, the estimated fetal weight and the  abdominal circumference measurements are at the 1st  percentile.  Head circumference measurement is at -2 SD.  Interval weight gain is 78 g.  Umbilical artery Doppler showed  intermittent absent end-diastolic flow.  I counseled the couple on the poor fetal weight gain.  I recommended inpatient management because of her  multiple medical conditions.  Antenatal corticosteroids can be  given as an inpatient and neonatal consultation will be  obtained to discuss the prognosis of the newborn.  Initially, we plan to give antenatal corticosteroids at our office.  I offered admission tomorrow.  Patient would like to defer   antenatal corticosteroids till her admission.  She preferred to  be admitted on Monday.  Patient will be returning on Monday morning for UA Doppler  studies. ---------------------------------------------------------------------- Recommendations  -UA Doppler on 03/22/22.  -Admit Ob Specialty on 03/22/22.  -Betamethasone course.  -Neonatal consultation.  -Daily NST  -Twice-weekly UA Doppler or more frequently as needed.  Suanne Marker message sent to Dr. Harolyn Rutherford and Dr. Elgie Congo  (admitting attending) ----------------------------------------------------------------------                 Tama High, MD Electronically Signed Final Report   03/18/2022 05:27 pm ----------------------------------------------------------------------  [place] MFM UA CORD DOPPLER  Result Date: 03/15/2022 ----------------------------------------------------------------------  OBSTETRICS REPORT                       (Signed Final 03/15/2022 09:08  am) ---------------------------------------------------------------------- Patient Info  ID #:       [id number]                          D.O.B.:  1980/12/04 (41 yrs)  Name:       TERRACE GORSLINE                Visit Date: 03/15/2022 08:45 am ---------------------------------------------------------------------- Performed By  Attending:        Tama High MD        Ref. Address:     [place]  Performed By:     Rodrigo Ran BS      [facility]                    RD[facility]                              Fetal [facility] at                                                             [place] for                                                             Women  Referred By:      [facility] ---------------------------------------------------------------------- Orders  #  Description                           Code        Ordered By  1  [place] MFM UA CORD DOPPLER                76820.02    YU FANG  2  [place] MFM OB LIMITED                      [id number]    YU FANG ----------------------------------------------------------------------  #  Order #                     Accession #                Episode #  1  [id number]                   [id number]                 [id number]  2  [id number]                   [id number]  [id number] ---------------------------------------------------------------------- Indications  Maternal care for known or suspected poor      O36.5920  fetal growth, second trimester, not applicable  or unspecified IUGR  Hypertension - Chronic/Pre-existing            O10.019  (Procardia)  Advanced maternal age multigravida 24+,        O34.522  second trimester (27 yrs)  Pre-existing diabetes, type 2, in pregnancy,   O24.112  second trimester (metformin, insulin)  Obesity complicating pregnancy, second         O99.212  trimester (Pre-G BMI 30.3)  Cervical cerclage suture present, second       O34.32  trimester (12/21/21)  Pre-existing essential hypertension            0000000  complicating pregnancy, second trimester  (procardia)  Asthma                                         O99.89 j45.909  [redacted] weeks gestation of pregnancy                Z3A.26  Poor obstetric history: Previous preterm       O09.219  delivery, antepartum (PPROM, 20wks)  LR NIPS/AFP Neg/Horizon Neg  Encounter for other antenatal screening        Z36.2  follow-up ---------------------------------------------------------------------- Fetal Evaluation  Num Of Fetuses:         1  Fetal Heart Rate(bpm):  136  Cardiac Activity:       Observed  Presentation:           Breech  Placenta:               Posterior  P. Cord Insertion:      Previously visualized  Amniotic Fluid  AFI FV:      Within normal limits                              Largest Pocket(cm)                              3.65 ---------------------------------------------------------------------- Biometry  LV:        2.8  mm ---------------------------------------------------------------------- OB  History  Gravidity:    3         Term:   0        Prem:   1        SAB:   1  TOP:          0       Ectopic:  0        Living: 1 ---------------------------------------------------------------------- Gestational Age  LMP:           28w 3d        Date:  08/28/21                   EDD:   06/04/22  Best:          Vicenta Dunning 2d     Det. By:  U/S  (12/23/21)          EDD:   06/19/22 ---------------------------------------------------------------------- Anatomy  Cranium:               Appears normal         Heart:  Appears normal                                                                        (4CH, axis, and                                                                        situs)  Cavum:                 Appears normal         Stomach:                Appears normal, left                                                                        sided  Ventricles:            Appears normal         Bladder:                Appears normal ---------------------------------------------------------------------- Doppler - Fetal Vessels  Umbilical Artery   S/D     %tile      RI    %tile      PI    %tile     PSV    ADFV    RDFV                                                     (cm/s)  13.21   > 97.5    0.92   > 97.5    2.13   > 97.5    38.12     Yes      No ---------------------------------------------------------------------- Impression  Severe fetal growth restriction with abnormal Doppler studies.  On ultrasound performed 2 weeks ago, the estimated fetal  weight was at the 1st percentile.  Patient has chronic hypertension and takes nifedipine XL 60  mg daily.  She has type 2 diabetes and takes insulin and metformin.  Patient had prophylactic cerclage in this pregnancy.  On today's ultrasound, amniotic fluid is normal and good fetal  activity is seen. Umbilical artery Doppler showed persistent  absent-end-diastolic flow.  I reassured the patient of stable Doppler findings. I discussed  the role of antenatal  corticosteroids. Patient is aware that  hyperglycemia can occur with steroids. She reports her blood  glucose levels are within normal range and would like to take  steroids as outpatient. ---------------------------------------------------------------------- Recommendations  -Betamethasone on 03/18/22 and 03/19/22.  -Fetal growth assessment at next visit.  -NST from 03/25/22.  -Neonatology consultation  was discussed (no referral made  now). ----------------------------------------------------------------------                 Tama High, MD Electronically Signed Final Report   03/15/2022 09:08 am ----------------------------------------------------------------------  [place] MFM OB LIMITED  Result Date: 03/15/2022 ----------------------------------------------------------------------  OBSTETRICS REPORT                       (Signed Final 03/15/2022 09:08 am) ---------------------------------------------------------------------- Patient Info  ID #:       [id number]                          D.O.B.:  [birth year] (41 yrs)  Name:       Bridget Scott                Visit Date: 03/15/2022 08:45 am ---------------------------------------------------------------------- Performed By  Attending:        Tama High MD        Ref. Address:     [place]  Performed By:     Rodrigo Ran BS      Location[facility]         [facility]                    RDMS RVTdin                              Fetal Care at                                                             [place] for                                                             Women  Referred By:      [facility] ---------------------------------------------------------------------- Orders  #  Description                           Code        Ordered By  1  [place] MFM UA CORD DOPPLER                76820.02    YU FANG  2  [place] MFM OB LIMITED                     [id number]    YU FANG  ----------------------------------------------------------------------  #  Order #                     Accession #  Episode #  1  [id number]                   [id number]                 [id number]  2  [id number]                   [id number]                 [id number] ---------------------------------------------------------------------- Indications  Maternal care for known or suspected poor      O36.5920  fetal growth, second trimester, not applicable  or unspecified IUGR  Hypertension - Chronic/Pre-existing            O10.019  (Procardia)  Advanced maternal age multigravida 3+,        O77.522  second trimester (64 yrs)  Pre-existing diabetes, type 2, in pregnancy,   O24.112  second trimester (metformin, insulin)  Obesity complicating pregnancy, second         O99.212  trimester (Pre-G BMI 30.3)  Cervical cerclage suture present, second       O34.32  trimester (12/21/21)  Pre-existing essential hypertension            0000000  complicating pregnancy, second trimester  (procardia)  Asthma                                         O99.89 j45.909  [redacted] weeks gestation of pregnancy                Z3A.26  Poor obstetric history: Previous preterm       O09.219  delivery, antepartum (PPROM, 20wks)  LR NIPS/AFP Neg/Horizon Neg  Encounter for other antenatal screening        Z36.2  follow-up ---------------------------------------------------------------------- Fetal Evaluation  Num Of Fetuses:         1  Fetal Heart Rate(bpm):  136  Cardiac Activity:       Observed  Presentation:           Breech  Placenta:               Posterior  P. Cord Insertion:      Previously visualized  Amniotic Fluid  AFI FV:      Within normal limits                              Largest Pocket(cm)                              3.65 ---------------------------------------------------------------------- Biometry  LV:        2.8  mm ---------------------------------------------------------------------- OB History  Gravidity:    3         Term:    0        Prem:   1        SAB:   1  TOP:          0       Ectopic:  0        Living: 1 ---------------------------------------------------------------------- Gestational Age  LMP:           28w 3d        Date:  08/28/21  EDD:   06/04/22  Best:          Vicenta Dunning 2d     Det. By:  U/S  (12/23/21)          EDD:   06/19/22 ---------------------------------------------------------------------- Anatomy  Cranium:               Appears normal         Heart:                  Appears normal                                                                        (4CH, axis, and                                                                        situs)  Cavum:                 Appears normal         Stomach:                Appears normal, left                                                                        sided  Ventricles:            Appears normal         Bladder:                Appears normal ---------------------------------------------------------------------- Doppler - Fetal Vessels  Umbilical Artery   S/D     %tile      RI    %tile      PI    %tile     PSV    ADFV    RDFV                                                     (cm/s)  13.21   > 97.5    0.92   > 97.5    2.13   > 97.5    38.12     Yes      No ---------------------------------------------------------------------- Impression  Severe fetal growth restriction with abnormal Doppler studies.  On ultrasound performed 2 weeks ago, the estimated fetal  weight was at the 1st percentile.  Patient has chronic hypertension and takes nifedipine XL 60  mg daily.  She has type 2 diabetes and takes insulin and metformin.  Patient had prophylactic cerclage in this pregnancy.  On today's ultrasound, amniotic fluid is  normal and good fetal  activity is seen. Umbilical artery Doppler showed persistent  absent-end-diastolic flow.  I reassured the patient of stable Doppler findings. I discussed  the role of antenatal corticosteroids. Patient is aware that   hyperglycemia can occur with steroids. She reports her blood  glucose levels are within normal range and would like to take  steroids as outpatient. ---------------------------------------------------------------------- Recommendations  -Betamethasone on 03/18/22 and 03/19/22.  -Fetal growth assessment at next visit.  -NST from 03/25/22.  -Neonatology consultation was discussed (no referral made  now). ----------------------------------------------------------------------                 Tama High, MD Electronically Signed Final Report   03/15/2022 09:08 am ----------------------------------------------------------------------  [place] MFM UA CORD DOPPLER  Result Date: 03/11/2022 ----------------------------------------------------------------------  OBSTETRICS REPORT                       (Signed Final 03/11/2022 09:07 am) ---------------------------------------------------------------------- Patient Info  ID #:       [id number]                          D.O.B.:  May 25, 1980 (41 yrs)  Name:       Bridget Scott                Visit Date: 03/11/2022 07:51 am ---------------------------------------------------------------------- Performed By  Attending:        Tama High MD        Ref. Address:     [place]  Performed By:     Nevin Bloodgood          [facility]                    RDMS                                     Fetal Care at                                                             [place] for                                                             Women  Referred By:      [facility] ---------------------------------------------------------------------- Orders  #  Description                           Code        Ordered By  1  [place] MFM  UA CORD DOPPLER                76820.02    Peterson Ao  2  [place] MFM OB LIMITED                     [id number]    YU FANG  ----------------------------------------------------------------------  #  Order #                     Accession #                Episode #  1  [id number]                   [id number]                 [id number]  2  [id number]                   [id number]                 [id number] ---------------------------------------------------------------------- Indications  Maternal care for known or suspected poor      O36.5920  fetal growth, second trimester, not applicable  or unspecified IUGR  Hypertension - Chronic/Pre-existing            O10.019  (Procardia)  Advanced maternal age multigravida 53+,        O1.522  second trimester (42 yrs)  Pre-existing diabetes, type 2, in pregnancy,   O24.112  second trimester (metformin, insulin)  Obesity complicating pregnancy, second         O99.212  trimester (Pre-G BMI 30.3)  Cervical cerclage suture present, second       O34.32  trimester (12/21/21)  Pre-existing essential hypertension            0000000  complicating pregnancy, second trimester  (procardia)  Asthma                                         O99.89 j45.909  [redacted] weeks gestation of pregnancy                Z3A.25  Poor obstetric history: Previous preterm       O09.219  delivery, antepartum (PPROM, 20wks)  LR NIPS/AFP Neg/Horizon Neg ---------------------------------------------------------------------- Fetal Evaluation  Num Of Fetuses:         1  Fetal Heart Rate(bpm):  140  Cardiac Activity:       Observed  Presentation:           Breech  Placenta:               Posterior  P. Cord Insertion:      Previously visualized  Amniotic Fluid  AFI FV:      Within normal limits                              Largest Pocket(cm)                              3.14 ---------------------------------------------------------------------- OB History  Gravidity:    3         Term:   0        Prem:   1        SAB:   1  TOP:          0       Ectopic:  0        Living: 1 ----------------------------------------------------------------------  Gestational Age  LMP:           27w 6d        Date:  08/28/21                   EDD:   06/04/22  Best:          Melvyn Novas 5d     Det. By:  U/S  (12/23/21)          EDD:   06/19/22 ---------------------------------------------------------------------- Anatomy  Cranium:               Appears normal         Kidneys:                Appear normal  Diaphragm:             Appears normal         Bladder:                Appears normal  Stomach:               Appears normal, left                         sided ---------------------------------------------------------------------- Doppler - Fetal Vessels  Umbilical Artery   S/D     %tile      RI    %tile      PI    %tile     PSV    ADFV    RDFV                                                     (cm/s)   8.81   > 97.5    0.89   > 97.5    2.32   > 97.5    39.93     Yes      No ---------------------------------------------------------------------- Cervix Uterus Adnexa  Cervix  Not visualized (advanced GA >24wks)  Uterus  No abnormality visualized.  Right Ovary  Not visualized.  Left Ovary  Not visualized.  Cul De Sac  No free fluid seen.  Adnexa  No abnormality visualized ---------------------------------------------------------------------- Impression  Severe fetal growth restriction with abnormal Doppler studies.  On ultrasound performed 2 weeks ago, the estimated fetal  weight was at the 1st percentile.  Patient has multiple medical problems including chronic  hypertension and type 2 diabetes (on insulin and metformin).  She is having twice weekly Doppler studies.  Amniotic fluid is normal and good fetal activity seen.  Umbilical artery Doppler showed intermittent absent end-  diastolic flow.  I discussed the findings and the importance of twice weekly  monitoring.  Antenatal testing has limitations fetal  compromise (stillbirth) cannot be predicted.  I discussed the role of antenatal corticosteroids.  Will  consider when persistent absent end-diastolic flow is seen.  Because of her  diabetes, steroids may be given as an  inpatient. ---------------------------------------------------------------------- Recommendations  -Continue twice-weekly Doppler studies.  -NST from 03/25/22 appointment. ----------------------------------------------------------------------                 Tama High, MD Electronically Signed Final  Report   03/11/2022 09:07 am ----------------------------------------------------------------------  [place] MFM OB LIMITED  Result Date: 03/11/2022 ----------------------------------------------------------------------  OBSTETRICS REPORT                       (Signed Final 03/11/2022 09:07 am) ---------------------------------------------------------------------- Patient Info  ID #:       [id number]                          D.O.B.:  09-03-1980 (41 yrs)  Name:       Bridget Scott                Visit Date: 03/11/2022 07:51 am ---------------------------------------------------------------------- Performed By  Attending:        Tama High MD        Ref. Address:     [place]  Performed By:     Nevin Bloodgood          [facility]                    RDMS                                     Fetal Care at                                                             [place] for                                                             Women  Referred By:      [facility] ---------------------------------------------------------------------- Orders  #  Description                           Code        Ordered By  1  [place] MFM UA CORD DOPPLER                [phone]    Peterson Ao  2  [place] MFM OB LIMITED                     [id number]    YU FANG ----------------------------------------------------------------------  #  Order #                     Accession #                Episode #  1  [id number]  [id number]                 [id number]  2  [id number]                   [id number]                  [id number] ---------------------------------------------------------------------- Indications  Maternal care for known or suspected poor      O36.5920  fetal growth, second trimester, not applicable  or unspecified IUGR  Hypertension - Chronic/Pre-existing            O10.019  (Procardia)  Advanced maternal age multigravida 74+,        O64.522  second trimester (61 yrs)  Pre-existing diabetes, type 2, in pregnancy,   O24.112  second trimester (metformin, insulin)  Obesity complicating pregnancy, second         O99.212  trimester (Pre-G BMI 30.3)  Cervical cerclage suture present, second       O34.32  trimester (12/21/21)  Pre-existing essential hypertension            0000000  complicating pregnancy, second trimester  (procardia)  Asthma                                         O99.89 j45.909  [redacted] weeks gestation of pregnancy                Z3A.25  Poor obstetric history: Previous preterm       O09.219  delivery, antepartum (PPROM, 20wks)  LR NIPS/AFP Neg/Horizon Neg ---------------------------------------------------------------------- Fetal Evaluation  Num Of Fetuses:         1  Fetal Heart Rate(bpm):  140  Cardiac Activity:       Observed  Presentation:           Breech  Placenta:               Posterior  P. Cord Insertion:      Previously visualized  Amniotic Fluid  AFI FV:      Within normal limits                              Largest Pocket(cm)                              3.14 ---------------------------------------------------------------------- OB History  Gravidity:    3         Term:   0        Prem:   1        SAB:   1  TOP:          0       Ectopic:  0        Living: 1 ---------------------------------------------------------------------- Gestational Age  LMP:           27w 6d        Date:  08/28/21                   EDD:   06/04/22  Best:          Melvyn Novas 5d     Det. By:  U/S  (12/23/21)          EDD:   06/19/22 ---------------------------------------------------------------------- Anatomy  Cranium:  Appears normal         Kidneys:                Appear normal  Diaphragm:             Appears normal         Bladder:                Appears normal  Stomach:               Appears normal, left                         sided ---------------------------------------------------------------------- Doppler - Fetal Vessels  Umbilical Artery   S/D     %tile      RI    %tile      PI    %tile     PSV    ADFV    RDFV                                                     (cm/s)   8.81   > 97.5    0.89   > 97.5    2.32   > 97.5    39.93     Yes      No ---------------------------------------------------------------------- Cervix Uterus Adnexa  Cervix  Not visualized (advanced GA >24wks)  Uterus  No abnormality visualized.  Right Ovary  Not visualized.  Left Ovary  Not visualized.  Cul De Sac  No free fluid seen.  Adnexa  No abnormality visualized ---------------------------------------------------------------------- Impression  Severe fetal growth restriction with abnormal Doppler studies.  On ultrasound performed 2 weeks ago, the estimated fetal  weight was at the 1st percentile.  Patient has multiple medical problems including chronic  hypertension and type 2 diabetes (on insulin and metformin).  She is having twice weekly Doppler studies.  Amniotic fluid is normal and good fetal activity seen.  Umbilical artery Doppler showed intermittent absent end-  diastolic flow.  I discussed the findings and the importance of twice weekly  monitoring.  Antenatal testing has limitations fetal  compromise (stillbirth) cannot be predicted.  I discussed the role of antenatal corticosteroids.  Will  consider when persistent absent end-diastolic flow is seen.  Because of her diabetes, steroids may be given as an  inpatient. ---------------------------------------------------------------------- Recommendations  -Continue twice-weekly Doppler studies.  -NST from 03/25/22 appointment.  ----------------------------------------------------------------------                 Tama High, MD Electronically Signed Final Report   03/11/2022 09:07 am ----------------------------------------------------------------------  [place] MFM UA CORD DOPPLER  Result Date: 03/08/2022 ----------------------------------------------------------------------  OBSTETRICS REPORT                       (Signed Final 03/08/2022 03:35 pm) ---------------------------------------------------------------------- Patient Info  ID #:       [id number]                          D.O.B.:  1980-03-11 (41 yrs)  Name:       Bridget Scott                Visit Date: 03/08/2022 10:10 am ---------------------------------------------------------------------- Performed By  Attending:  Johnell Comings MD         Ref. Address:     [place]  Performed By:     Stephenie Acres        Location[facility]                    BS RDMS                                  Fetal Care at                                                             [place] for                                                             Women  Referred By:      [facility] ---------------------------------------------------------------------- Orders  #  Description                           Code        Ordered By  1  [place] MFM UA CORD DOPPLER                [phone]    Peterson Ao  2  [place] MFM OB LIMITED                     [id number]    YU FANG ----------------------------------------------------------------------  #  Order #                     Accession #                Episode #  1  [id number]                   [id number]                 [id number]  2  [id number]                   [id number]                 [id number] ---------------------------------------------------------------------- Indications  Maternal care for known or suspected poor      O36.5920  fetal growth, second trimester, not  applicable  or unspecified IUGR  Hypertension - Chronic/Pre-existing            O10.019  (Procardia)  Advanced maternal age multigravida 32+,        O51.522  second trimester (85 yrs)  Pre-existing diabetes, type 2, in  pregnancy,   O24.112  second trimester (metformin, insulin)  Obesity complicating pregnancy, second         O99.212  trimester (Pre-G BMI 30.3)  Cervical cerclage suture present, second       O34.32  trimester (12/21/21)  Pre-existing essential hypertension            0000000  complicating pregnancy, second trimester  (procardia)  Asthma                                         O99.89 j45.909  Poor obstetric history: Previous preterm       O09.219  delivery, antepartum (PPROM, 20wks)  LR NIPS/AFP Neg/Horizon Neg  [redacted] weeks gestation of pregnancy                Z3A.25 ---------------------------------------------------------------------- Fetal Evaluation  Num Of Fetuses:         1  Fetal Heart Rate(bpm):  146  Cardiac Activity:       Observed  Presentation:           Cephalic  Placenta:               Posterior  P. Cord Insertion:      Previously visualized  Amniotic Fluid  AFI FV:      Within normal limits                              Largest Pocket(cm)                              2.89 ---------------------------------------------------------------------- Biometry  LV:        3.2  mm ---------------------------------------------------------------------- OB History  Gravidity:    3         Term:   0        Prem:   1        SAB:   1  TOP:          0       Ectopic:  0        Living: 1 ---------------------------------------------------------------------- Gestational Age  LMP:           27w 3d        Date:  08/28/21                   EDD:   06/04/22  Best:          Melvyn Novas 2d     Det. By:  U/S  (12/23/21)          EDD:   06/19/22 ---------------------------------------------------------------------- Anatomy  Cranium:               Appears normal         Kidneys:                Appear normal  Diaphragm:              Appears normal         Bladder:                Appears normal  Stomach:               Appears normal, left  sided ---------------------------------------------------------------------- Doppler - Fetal Vessels  Umbilical Artery   S/D     %tile      RI    %tile      PI    %tile     PSV    ADFV    RDFV                                                     (cm/s)   7.79   > 97.5    0.87   > 97.5    2.01   > 97.5    42.05     Yes      No ---------------------------------------------------------------------- Comments  This patient has been followed due to severe IUGR with  intermittent absent end-diastolic flow.  She denies any  problems since her last exam.  She reports feeling vigorous  fetal movements throughout the day.  Her pregnancy has also been complicated by advanced  maternal age, chronic hypertension treated with Procardia,  and pregestational diabetes treated with insulin and  metformin.  She currently has a cerclage in place.  Her blood pressure today was 133/84.  She reports that she had a normal fetal echocardiogram  performed with [facility] pediatric cardiology this morning.  Vigorous fetal movements were noted throughout today's  ultrasound exam.  There was normal amniotic fluid.  Doppler studies of the umbilical arteries continues to show  high resistance to flow with intermittent absent end-diastolic  flow.  There were no signs of reversed end-diastolic flow.  This is a stable finding compared to her exam last week.  The patient was reassured that as vigorous fetal movements  were noted throughout today's exam and her umbilical artery  Doppler studies are stable, the risk of a fetal demise is  probably low at this time.  We will continue with twice weekly umbilical artery Doppler  studies.  She will return in 3 days for another amniotic fluid check and  umbilical artery Doppler study. ----------------------------------------------------------------------                   Johnell Comings, MD  Electronically Signed Final Report   03/08/2022 03:35 pm ----------------------------------------------------------------------  [place] MFM OB LIMITED  Result Date: 03/08/2022 ----------------------------------------------------------------------  OBSTETRICS REPORT                       (Signed Final 03/08/2022 03:35 pm) ---------------------------------------------------------------------- Patient Info  ID #:       [id number]                          D.O.B.:  [birth year] (41 yrs)  Name:       Bridget Scott                Visit Date: 03/08/2022 10:10 am ---------------------------------------------------------------------- Performed By  Attending:        Johnell Comings MD         Ref. Address:     [address]  Performed By:     Stephenie Acres        [facility]                                  Fetal Care at                                                             [place] for                                                             Women  Referred By:      [facility] ---------------------------------------------------------------------- Orders  #  Description                           Code        Ordered By  1  [place] MFM UA CORD DOPPLER                [phone]    YU FANG  2  [place] MFM OB LIMITED                     [id number]    YU FANG ----------------------------------------------------------------------  #  Order #                     Accession #                Episode #  1  [id number]                   [id number]                 [id number]  2  [id number]                   [id number]                 [id number] ---------------------------------------------------------------------- Indications  Maternal care for known or suspected poor      O36.5920  fetal growth, second trimester, not applicable  or unspecified IUGR  Hypertension - Chronic/Pre-existing            O10.019  (Procardia)  Advanced  maternal age multigravida 73+,        O57.522  second trimester (15 yrs)  Pre-existing diabetes, type 2, in pregnancy,   O24.112  second trimester (metformin, insulin)  Obesity complicating pregnancy, second         O99.212  trimester (Pre-G BMI 30.3)  Cervical cerclage suture present, second       O34.32  trimester (12/21/21)  Pre-existing essential hypertension            0000000  complicating pregnancy, second trimester  (procardia)  Asthma  O99.89 j45.909  Poor obstetric history: Previous preterm       O09.219  delivery, antepartum (PPROM, 20wks)  LR NIPS/AFP Neg/Horizon Neg  [redacted] weeks gestation of pregnancy                Z3A.25 ---------------------------------------------------------------------- Fetal Evaluation  Num Of Fetuses:         1  Fetal Heart Rate(bpm):  146  Cardiac Activity:       Observed  Presentation:           Cephalic  Placenta:               Posterior  P. Cord Insertion:      Previously visualized  Amniotic Fluid  AFI FV:      Within normal limits                              Largest Pocket(cm)                              2.89 ---------------------------------------------------------------------- Biometry  LV:        3.2  mm ---------------------------------------------------------------------- OB History  Gravidity:    3         Term:   0        Prem:   1        SAB:   1  TOP:          0       Ectopic:  0        Living: 1 ---------------------------------------------------------------------- Gestational Age  LMP:           27w 3d        Date:  08/28/21                   EDD:   06/04/22  Best:          Melvyn Novas 2d     Det. By:  U/S  (12/23/21)          EDD:   06/19/22 ---------------------------------------------------------------------- Anatomy  Cranium:               Appears normal         Kidneys:                Appear normal  Diaphragm:             Appears normal         Bladder:                Appears normal  Stomach:               Appears normal, left                          sided ---------------------------------------------------------------------- Doppler - Fetal Vessels  Umbilical Artery   S/D     %tile      RI    %tile      PI    %tile     PSV    ADFV    RDFV                                                     (  cm/s)   7.79   > 97.5    0.87   > 97.5    2.01   > 97.5    42.05     Yes      No ---------------------------------------------------------------------- Comments  This patient has been followed due to severe IUGR with  intermittent absent end-diastolic flow.  She denies any  problems since her last exam.  She reports feeling vigorous  fetal movements throughout the day.  Her pregnancy has also been complicated by advanced  maternal age, chronic hypertension treated with Procardia,  and pregestational diabetes treated with insulin and  metformin.  She currently has a cerclage in place.  Her blood pressure today was 133/84.  She reports that she had a normal fetal echocardiogram  performed with [facility] pediatric cardiology this morning.  Vigorous fetal movements were noted throughout today's  ultrasound exam.  There was normal amniotic fluid.  Doppler studies of the umbilical arteries continues to show  high resistance to flow with intermittent absent end-diastolic  flow.  There were no signs of reversed end-diastolic flow.  This is a stable finding compared to her exam last week.  The patient was reassured that as vigorous fetal movements  were noted throughout today's exam and her umbilical artery  Doppler studies are stable, the risk of a fetal demise is  probably low at this time.  We will continue with twice weekly umbilical artery Doppler  studies.  She will return in 3 days for another amniotic fluid check and  umbilical artery Doppler study. ----------------------------------------------------------------------                   Johnell Comings, MD Electronically Signed Final Report   03/08/2022 03:35 pm  ----------------------------------------------------------------------  [place] MFM UA CORD DOPPLER  Result Date: 03/03/2022 ----------------------------------------------------------------------  OBSTETRICS REPORT                       (Signed Final 03/03/2022 09:50 am) ---------------------------------------------------------------------- Patient Info  ID #:       [id number]                          D.O.B.:  1980/10/29 (41 yrs)  Name:       Bridget Scott                Visit Date: 03/03/2022 08:34 am ---------------------------------------------------------------------- Performed By  Attending:        Johnell Comings MD         Ref. Address:     [place] [address]  Performed By:     Nevin Bloodgood          Location[facility]                    RD[facility]                                     Fetal Care at  [facility] for                                                             Women  Referred By:      [facility] ---------------------------------------------------------------------- Orders  #  Description                           Code        Ordered By  1  [place] MFM UA CORD DOPPLER                [phone]    Peterson Ao  2  [place] MFM OB LIMITED                     [id number]    YU FANG ----------------------------------------------------------------------  #  Order #                     Accession #                Episode #  1  [id number]                   [id number]                 [id number]  2  [id number]                   [id number]                 [id number] ---------------------------------------------------------------------- Indications  Maternal care for known or suspected poor      O36.5920  fetal growth, second trimester, not applicable  or unspecified IUGR  Hypertension - Chronic/Pre-existing            O10.019  (Procardia)  Advanced maternal age multigravida 54+,        O61.522  second  trimester (23 yrs)  Pre-existing diabetes, type 2, in pregnancy,   O24.112  second trimester (metformin, insulin)  Obesity complicating pregnancy, second         O99.212  trimester (Pre-G BMI 30.3)  Cervical cerclage suture present, second       O34.32  trimester (12/21/21)  Pre-existing essential hypertension            0000000  complicating pregnancy, second trimester  (procardia)  Asthma                                         O99.89 j45.909  [redacted] weeks gestation of pregnancy                Z3A.24  Poor obstetric history: Previous preterm       O09.219  delivery, antepartum (PPROM, 20wks)  LR NIPS/AFP Neg/Horizon Neg ---------------------------------------------------------------------- Fetal Evaluation  Num Of Fetuses:         1  Fetal Heart Rate(bpm):  161  Cardiac Activity:       Observed  Presentation:           Cephalic  Placenta:               Posterior  P. Cord Insertion:  Previously visualized  Amniotic Fluid  AFI FV:      Within normal limits                              Largest Pocket(cm)                              2.68 ---------------------------------------------------------------------- Biometry  LV:        7.2  mm ---------------------------------------------------------------------- OB History  Gravidity:    3         Term:   0        Prem:   1        SAB:   1  TOP:          0       Ectopic:  0        Living: 1 ---------------------------------------------------------------------- Gestational Age  LMP:           26w 5d        Date:  08/28/21                   EDD:   06/04/22  Best:          Sharmon Leyden 4d     Det. By:  U/S  (12/23/21)          EDD:   06/19/22 ---------------------------------------------------------------------- Anatomy  Cranium:               Appears normal         Stomach:                Appears normal, left                                                                        sided  Heart:                 Appears normal         Kidneys:                Appear normal                          (4CH, axis, and                         situs)  Diaphragm:             Appears normal         Bladder:                Appears normal ---------------------------------------------------------------------- Doppler - Fetal Vessels  Umbilical Artery   S/D     %tile      RI    %tile      PI    %tile     PSV    ADFV    RDFV                                                     (  cm/s)   7.46   > 97.5    0.87   > 97.5    2.14   > 97.5    37.42     Yes      No ---------------------------------------------------------------------- Cervix Uterus Adnexa  Cervix  Not visualized (advanced GA >24wks)  Uterus  No abnormality visualized.  Right Ovary  Within normal limits.  Left Ovary  Not visualized.  Cul De Sac  No free fluid seen.  Adnexa  No abnormality visualized ---------------------------------------------------------------------- Comments  This patient has been followed due to severe IUGR with  intermittent absent end-diastolic flow.  She denies any  problems since her last exam.  She reports feeling vigorous  fetal movements throughout the day.  Her pregnancy has also been complicated by advanced  maternal age, chronic hypertension treated with Procardia,  and pregestational diabetes treated with insulin and  metformin.  She currently has a cerclage in place.  Her blood pressures today were 155/80 and 138/79.  Vigorous fetal movements were noted throughout today's  ultrasound exam.  There was normal amniotic fluid.  Doppler studies of the umbilical arteries performed due to  fetal growth restriction continues to show high resistance to  flow with intermittent absent end-diastolic flow.  There were  no signs of reversed end-diastolic flow.  This is a stable  finding compared to her exam last week.  The patient was reassured that as vigorous fetal movements  were noted throughout today's exam and her umbilical artery  Doppler studies are stable, the risk of a fetal demise is  probably low at this time.  As the fetus is  potentially viable based on her gestational age  of [redacted] weeks and 4 days, we will see her for twice weekly  umbilical artery Doppler studies starting next week.  She understands that should there be any signs of reversed  end-diastolic flow on her umbilical artery Doppler studies, that  inpatient management along with the administration of a  complete course of antenatal corticosteroids will be  recommended in order to help her achieve a more optimal  gestational age at delivery.  We will repeat a growth scan in 2 weeks.  The patient is happy and comfortable with this management  plan.  She will return to our office twice next week for umbilical  artery Doppler studies.  Fetal kick count instructions were reviewed. ----------------------------------------------------------------------                  Johnell Comings, MD Electronically Signed Final Report   03/03/2022 09:50 am ----------------------------------------------------------------------  [place] MFM OB LIMITED  Result Date: 03/03/2022 ----------------------------------------------------------------------  OBSTETRICS REPORT                       (Signed Final 03/03/2022 09:50 am) ---------------------------------------------------------------------- Patient Info  ID #:       [id number]                          D.O.B.:  [birth year] (41 yrs)  Name:       Bridget Scott                Visit Date: 03/03/2022 08:34 am ---------------------------------------------------------------------- Performed By  Attending:        Johnell Comings MD         Ref. Address:     [address]  Performed By:     Nevin Bloodgood          Location[facility]                    RD[facility]                                     Fetal Care at                                                             [place] for                                                             Women  Referred By:      [facility]  ---------------------------------------------------------------------- Orders  #  Description                           Code        Ordered By  1  [place] MFM UA CORD DOPPLER                [phone]    YU FANG  2  [place] MFM OB LIMITED                     [id number]    YU FANG ----------------------------------------------------------------------  #  Order #                     Accession #                Episode #  1  [id number]                   [id number]                 [id number]  2  [id number]                   [id number]                 [id number] ---------------------------------------------------------------------- Indications  Maternal care for known or suspected poor      O36.5920  fetal growth, second trimester, not applicable  or unspecified IUGR  Hypertension - Chronic/Pre-existing            O10.019  (Procardia)  Advanced maternal age multigravida 52+,        O38.522  second trimester (60 yrs)  Pre-existing diabetes, type 2, in pregnancy,   O24.112  second trimester (metformin, insulin)  Obesity complicating pregnancy, second         O99.212  trimester (Pre-G BMI 30.3)  Cervical cerclage suture present, second       O34.32  trimester (12/21/21)  Pre-existing essential hypertension            0000000  complicating pregnancy, second trimester  (procardia)  Asthma  O99.89 j45.909  [redacted] weeks gestation of pregnancy                Z3A.24  Poor obstetric history: Previous preterm       O09.219  delivery, antepartum (PPROM, 20wks)  LR NIPS/AFP Neg/Horizon Neg ---------------------------------------------------------------------- Fetal Evaluation  Num Of Fetuses:         1  Fetal Heart Rate(bpm):  161  Cardiac Activity:       Observed  Presentation:           Cephalic  Placenta:               Posterior  P. Cord Insertion:      Previously visualized  Amniotic Fluid  AFI FV:      Within normal limits                              Largest Pocket(cm)                              2.68  ---------------------------------------------------------------------- Biometry  LV:        7.2  mm ---------------------------------------------------------------------- OB History  Gravidity:    3         Term:   0        Prem:   1        SAB:   1  TOP:          0       Ectopic:  0        Living: 1 ---------------------------------------------------------------------- Gestational Age  LMP:           26w 5d        Date:  08/28/21                   EDD:   06/04/22  Best:          24w 4d     Det. By:  U/S  (12/23/21)          EDD:   06/19/22 ---------------------------------------------------------------------- Anatomy  Cranium:               Appears normal         Stomach:                Appears normal, left                                                                        sided  Heart:                 Appears normal         Kidneys:                Appear normal                         (4CH, axis, and                         situs)  Diaphragm:             Appears  normal         Bladder:                Appears normal ---------------------------------------------------------------------- Doppler - Fetal Vessels  Umbilical Artery   S/D     %tile      RI    %tile      PI    %tile     PSV    ADFV    RDFV                                                     (cm/s)   7.46   > 97.5    0.87   > 97.5    2.14   > 97.5    37.42     Yes      No ---------------------------------------------------------------------- Cervix Uterus Adnexa  Cervix  Not visualized (advanced GA >24wks)  Uterus  No abnormality visualized.  Right Ovary  Within normal limits.  Left Ovary  Not visualized.  Cul De Sac  No free fluid seen.  Adnexa  No abnormality visualized ---------------------------------------------------------------------- Comments  This patient has been followed due to severe IUGR with  intermittent absent end-diastolic flow.  She denies any  problems since her last exam.  She reports feeling vigorous  fetal movements throughout the  day.  Her pregnancy has also been complicated by advanced  maternal age, chronic hypertension treated with Procardia,  and pregestational diabetes treated with insulin and  metformin.  She currently has a cerclage in place.  Her blood pressures today were 155/80 and 138/79.  Vigorous fetal movements were noted throughout today's  ultrasound exam.  There was normal amniotic fluid.  Doppler studies of the umbilical arteries performed due to  fetal growth restriction continues to show high resistance to  flow with intermittent absent end-diastolic flow.  There were  no signs of reversed end-diastolic flow.  This is a stable  finding compared to her exam last week.  The patient was reassured that as vigorous fetal movements  were noted throughout today's exam and her umbilical artery  Doppler studies are stable, the risk of a fetal demise is  probably low at this time.  As the fetus is potentially viable based on her gestational age  of [redacted] weeks and 4 days, we will see her for twice weekly  umbilical artery Doppler studies starting next week.  She understands that should there be any signs of reversed  end-diastolic flow on her umbilical artery Doppler studies, that  inpatient management along with the administration of a  complete course of antenatal corticosteroids will be  recommended in order to help her achieve a more optimal  gestational age at delivery.  We will repeat a growth scan in 2 weeks.  The patient is happy and comfortable with this management  plan.  She will return to our office twice next week for umbilical  artery Doppler studies.  Fetal kick count instructions were reviewed. ----------------------------------------------------------------------                  Johnell Comings, MD Electronically Signed Final Report   03/03/2022 09:50 am ----------------------------------------------------------------------   Future Appointments  Date Time Provider [place]  03/31/2022  4:10 PM Donnamae Jude,  MD [facility]  04/09/2022  3:00 PM Berniece Salines, DO [facility] None  04/14/2022  3:30 PM Donnamae Jude, MD [facility]    Discharge Condition: Stable  Discharge disposition: 01-Home or Self Care       Discharge Instructions     Activity as tolerated - No restrictions   Complete by: As directed    Call MD for:   Complete by: As directed    Decreased fetal movement, contractions, and vaginal bleeding.   Call MD for:  difficulty breathing, headache or visual disturbances   Complete by: As directed    Call MD for:  persistant nausea and vomiting   Complete by: As directed    Call MD for:  redness, tenderness, or signs of infection (pain, swelling, redness, odor or green/yellow discharge around incision site)   Complete by: As directed    Call MD for:  severe uncontrolled pain   Complete by: As directed    Call MD for:  temperature >100.4   Complete by: As directed    Diet general   Complete by: As directed    May shower / Bathe   Complete by: As directed       Allergies as of 03/28/2022       Reactions   Robitussin Dm Max Day-night Hives, Itching   Other reaction(s): Dizziness, Flushing, Vomiting   Analgesic Balm [trolamine (triethanolamine)] Rash   Bengay Pain Relief [menthol] Rash   Guaifenesin Hives, Rash, Other (See Comments), Nausea And Vomiting   "Made me sick" (Robitussin)   Latex Rash        Medication List     STOP taking these medications    benzonatate 100 MG capsule Commonly known as: TESSALON       TAKE these medications    Advair HFA 115-21 MCG/ACT inhaler Generic drug: fluticasone-salmeterol Inhale 2 puffs into the lungs 2 (two) times daily.   albuterol 108 (90 Base) MCG/ACT inhaler Commonly known as: VENTOLIN HFA Inhale 1-2 puffs into the lungs every 6 (six) hours as needed for wheezing or shortness of breath.   aspirin EC 81 MG tablet Take 1 tablet (81 mg total) by mouth daily. Start taking when you are [redacted] weeks  pregnant for rest of pregnancy for prevention of preeclampsia   blood glucose meter kit and supplies Kit Dispense based on patient and insurance preference. Use up to four times daily as directed. (FOR ICD-9 250.00, 250.01).   busPIRone 5 MG tablet Commonly known as: BUSPAR Take 10 mg by mouth 3 (three) times daily.   Cetirizine HCl 10 MG Caps Take 1 capsule (10 mg total) by mouth daily for 10 days. What changed:  when to take this reasons to take this   famotidine 20 MG tablet Commonly known as: PEPCID Take 1 tablet (20 mg total) by mouth 2 (two) times daily.   folic acid 1 MG tablet Commonly known as: FOLVITE Take 1 tablet (1 mg total) by mouth daily.   hydrOXYzine 25 MG tablet Commonly known as: ATARAX Take 25 mg by mouth 3 (three) times daily as needed for anxiety.   insulin glargine 100 UNIT/ML injection Commonly known as: Lantus Inject 0.2 mLs (20 Units total) into the skin at bedtime. What changed: how much to take   Insulin Pen Needle 30G X 8 MM Misc Commonly known as: NOVOFINE Inject 10 each into the skin as needed.   metFORMIN 1000 MG tablet Commonly known as: GLUCOPHAGE Take 1 tablet (1,000 mg total) by mouth 2 (two) times daily with a meal.   nicotine polacrilex 2 MG gum Commonly  known as: NICORETTE Take 2 mg by mouth as needed for smoking cessation.   NIFEdipine 90 MG 24 hr tablet Commonly known as: PROCARDIA XL/NIFEDICAL-XL Take 1 tablet (90 mg total) by mouth daily. Start taking on: March 29, 2022 What changed:  medication strength how much to take   Prenatal 28-0.8 MG Tabs Take 1 tablet by mouth daily.        Follow-up [place] [facility] Follow up on 03/31/2022.   Specialty: Obstetrics and Gynecology Contact information: [place] [phone]                Total discharge time: 20 minutes   Signed: Radene Gunning  M.D. 03/28/2022, 1:38 PM

## 2022-03-28 NOTE — Progress Notes (Signed)
Patient ID: Bridget Scott, female   DOB: March 23, 1980, 43 y.o.   MRN: [id number] Patient ID: Bridget Scott, female   DOB: Apr 24, 1980, 42 y.o.   MRN: [id number] Bridget Scott) NOTE  Bridget Scott is a 42 y.o. G3P0110 with Estimated Date of Delivery: 06/19/22   By   [redacted]w[redacted]d who is admitted for FGR with IR UAD.    Fetal presentation is unsure. Length of Stay:  2  Days  Date of admission:03/26/2022  Subjective:  Patient reports the fetal movement as active. Patient reports uterine contraction  activity as none. Patient reports  vaginal bleeding as none. Patient describes fluid per vagina as None.  Vitals:  Blood pressure (!) 150/92, pulse 68, temperature (!) 97.5 F (36.4 C), temperature source Oral, resp. rate 18, height '5\' 5"'$  (1.651 m), weight 89 kg, last menstrual period 08/28/2021, SpO2 98 %. Vitals:   03/27/22 2336 03/28/22 0607 03/28/22 0753 03/28/22 1136  BP: 137/75 (!) 150/80 (!) 135/91 (!) 150/92  Pulse: 83 66 63 68  Resp: '18 17 18 18  '$ Temp: 97.7 F (36.5 C) 98 F (36.7 C) 98 F (36.7 C) (!) 97.5 F (36.4 C)  TempSrc: Oral Oral Oral Oral  SpO2:  100% 99% 98%  Weight: 89 kg     Height: '5\' 5"'$  (1.651 m)      Physical Examination:  General appearance - alert, well appearing, and in no distress Abdomen - soft, nontender, nondistended, no masses or organomegaly Fundal Height:  size less than dates Pelvic Exam:  examination not indicated Cervical Exam:  Extremities: extremities normal, atraumatic, no cyanosis or edema with DTRs 2+ bilaterally Membranes:intact  Fetal Monitoring:  Baseline: 150 bpm, Variability: Fair (1-6 bpm), Accelerations: Non-reactive but appropriate for gestational age, and Decelerations: Absent   equivocal  Labs:  Results for orders placed or performed during the hospital encounter of 03/26/22 (from the past 24 hour(s))  Glucose, capillary   Collection Time: 03/27/22  3:07 PM  Result Value Ref Range   Glucose-Capillary 339  (H) 70 - 99 mg/dL  Glucose, capillary   Collection Time: 03/27/22  9:55 PM  Result Value Ref Range   Glucose-Capillary 370 (H) 70 - 99 mg/dL   Comment 1 Notify RN   Glucose, capillary   Collection Time: 03/27/22 11:35 PM  Result Value Ref Range   Glucose-Capillary 315 (H) 70 - 99 mg/dL   Comment 1 Notify RN   Glucose, capillary   Collection Time: 03/28/22  6:08 AM  Result Value Ref Range   Glucose-Capillary 325 (H) 70 - 99 mg/dL   Comment 1 Notify RN   Glucose, capillary   Collection Time: 03/28/22 11:59 AM  Result Value Ref Range   Glucose-Capillary 227 (H) 70 - 99 mg/dL    Imaging Studies:    [place]MFM UA CORD DOPPLER  Result Date: 03/26/2022 ----------------------------------------------------------------------  OBSTETRICS REPORT                       (Signed Final 03/26/2022 05:32 pm) ---------------------------------------------------------------------- Patient Info  ID #:       [id number]                         D.O.B.:  [birth year](42 yrs)  Name:       LDonnella Scott               Visit Date: 03/26/2022 01:17 pm ---------------------------------------------------------------------- Performed By  Attending:  Johnell Comings MD         Ref. Address:     [place]  Performed By:     Jacob Moores BS,       [facility]                    RDMS, RVT                                [facility] at                                                             [place] for                                                             Women  Referred By:      [facility] ---------------------------------------------------------------------- Orders  #  Description                           Code        Ordered By  1  [place] MFM UA CORD DOPPLER                [id number]    YU FANG  2  [place] MFM OB LIMITED                     [id number]    YU FANG ----------------------------------------------------------------------   #  Order #                     Accession #                Episode #  1  [id number]                   [id number]                 [id number]  2  [id number]                   [id number]                 [id number] ---------------------------------------------------------------------- Indications  Maternal care for known or suspected poor      O36.5920  fetal growth, second trimester, not applicable  or unspecified IUGR  Absent end diastolic flow (Fetal and           O36.90X0  placental problem in pregnancy)  Advanced maternal age multigravida 43+,        O63.522  second trimester (17 yrs)  Hypertension -  Chronic/Pre-existing            O10.019  (Procardia)  Pre-existing diabetes, type 2, in pregnancy,   O24.112  second trimester (metformin, insulin)  [redacted] weeks gestation of pregnancy                [id number]  Obesity complicating pregnancy, second         O99.212  trimester (Pre-G BMI 30.3)  Cervical cerclage suture present, second       O34.32  trimester (12/21/21)  Poor obstetric history: Previous preterm       O09.219  delivery, antepartum (PPROM, 20wks)  Asthma                                         O99.89 j45.909  LR NIPS/AFP Neg/Horizon Neg ---------------------------------------------------------------------- Fetal Evaluation  Num Of Fetuses:         1  Fetal Heart Rate(bpm):  146  Cardiac Activity:       Observed  Presentation:           Cephalic  Placenta:               Posterior  P. Cord Insertion:      Previously visualized  Amniotic Fluid  AFI FV:      Within normal limits                              Largest Pocket(cm)                              3.22 ---------------------------------------------------------------------- OB History  Gravidity:    3         Term:   0        Prem:   1        SAB:   1  TOP:          0       Ectopic:  0        Living: 1 ---------------------------------------------------------------------- Gestational Age  LMP:           30w 0d        Date:  08/28/21                   EDD:   06/04/22   Best:          27w 6d     Det. By:  U/S  (12/23/21)          EDD:   06/19/22 ---------------------------------------------------------------------- Anatomy  Cranium:               Appears normal         LVOT:                   Previously seen  Cavum:                 Previously             Aortic Arch:            Previously seen                         visualized  Ventricles:            Appears normal  Ductal Arch:            Previously seen  Choroid Plexus:        Previously seen        Diaphragm:              Appears normal  Cerebellum:            Previously seen        Stomach:                Appears normal, left                                                                        sided  Posterior Fossa:       Previously seen        Abdomen:                Previously seen  Nuchal Fold:           Previously seen        Abdominal Wall:         Previously seen  Face:                  Orbits and profile     Cord Vessels:           Previously seen                         previously seen  Lips:                  Previously seen        Kidneys:                Appear normal  Palate:                Previously             Bladder:                Appears normal                         visualized  Thoracic:              Appears normal         Spine:                  Not well visualized                                                                        (Trv views normal)  Heart:                 Appears normal         Upper Extremities:      Previously seen                         (4CH,  axis, and                         situs)  RVOT:                  Previously seen        Lower Extremities:      Previously seen  Other:  Female gender, Nasal bone, lenses, Heels/feet, open hands/5th digits          prev visualized. Technically difficult due to maternal habitus and fetal          position. ---------------------------------------------------------------------- Doppler - Fetal Vessels  Umbilical Artery                                                               ADFV    RDFV                                                                Yes     Yes ---------------------------------------------------------------------- Cervix Uterus Adnexa  Cervix  Not visualized (advanced GA >24wks)  Uterus  No abnormality visualized.  Right Ovary  Within normal limits.  Left Ovary  Within normal limits.  Cul De Sac  No free fluid seen.  Adnexa  No adnexal mass visualized ---------------------------------------------------------------------- Comments  Bridget Scott has been followed due to severe IUGR.  She  is currently at 27 weeks and 6 days.  Her most recent EFW  performed 4 days ago was 522 g (1 pound 2 ounces, less  than the 1st percentile).  Intermittent absent end-diastolic flow  has also been noted on her umbilical artery Doppler studies.  She received a complete course of antenatal corticosteroids  last week.  She denies any problems since her last exam and reports  feeling fetal movements throughout the day.  Her blood pressures remain mildly elevated 157/81 and  141/82.  Intermittent reversed end-diastolic flow was noted on today's  umbilical artery Doppler studies.  There was normal amniotic  fluid noted.  Fetal movements were noted throughout today's ultrasound  exam.  She had an NST following today's ultrasound exam that did  not show any decelerations.  There was minimal variability  and  one 10 x 10 accelerations noted.  The increased risk of a fetal demise due to severe IUGR with  reversed end-diastolic flow was discussed.  After an extensive discussion, the patient and her husband  agreed to be admitted to the hospital later this evening for  continuous monitoring.  Once she is admitted, she should be placed on continuous  monitoring, receive magnesium sulfate for fetal  neuroprotection, and receive a rescue course (2 more doses)  of betamethasone.  She should also have a NICU consult.  We will repeat the umbilical artery Doppler  studies again in 2  days (on Sunday morning).  Should persistent reversed end-diastolic flow be noted on  that exam, delivery will be recommended.  Delivery is recommended at any time should persistent  decelerations be noted on her fetal heart rate tracing.  As  the fetus is unlikely to tolerate the labor process, she  should probably be delivered via C-section.  The patient has stated that she would like a tubal ligation  performed at the time of her cesarean delivery.  She has  signed all necessary state consent forms for the tubal ligation  during her prenatal visits.  She understands that a tubal ligation is permanent.  She  would like the tubal ligation performed regardless of the  outcome of her current pregnancy.  She stated that all of her questions were answered today.  A total of 20 minutes was spent counseling and coordinating  the care for this patient.  Greater than 50% of the time was  spent in direct face-to-face contact. ----------------------------------------------------------------------                  Bridget Comings, MD Electronically Signed Final Report   03/26/2022 05:32 pm ----------------------------------------------------------------------  [place] MFM OB LIMITED  Result Date: 03/26/2022 ----------------------------------------------------------------------  OBSTETRICS REPORT                       (Signed Final 03/26/2022 05:32 pm) ---------------------------------------------------------------------- Patient Info  ID #:       [id number]                          D.O.B.:  05-Mar-1980 (41 yrs)  Name:       Bridget Scott                Visit Date: 03/26/2022 01:17 pm ---------------------------------------------------------------------- Performed By  Attending:        Johnell Comings MD         Ref. Address:     [place]  Performed By:     Jacob Moores BS,       [facility]                    RDMS, RVT                                 Fetal Care at                                                             [place] for                                                             Women  Referred By:      [facility] ---------------------------------------------------------------------- Orders  #  Description  Code        Ordered By  1  [place] MFM UA CORD DOPPLER                [id number]    YU FANG  2  [place] MFM OB LIMITED                     [id number]    YU FANG ----------------------------------------------------------------------  #  Order #                     Accession #                Episode #  1  [id number]                   [id number]                 [id number]  2  [id number]                   [id number]                 [id number] ---------------------------------------------------------------------- Indications  Maternal care for known or suspected poor      O36.5920  fetal growth, second trimester, not applicable  or unspecified IUGR  Absent end diastolic flow (Fetal and           O36.90X0  placental problem in pregnancy)  Advanced maternal age multigravida 26+,        O60.522  second trimester (85 yrs)  Hypertension - Chronic/Pre-existing            O10.019  (Procardia)  Pre-existing diabetes, type 2, in pregnancy,   O24.112  second trimester (metformin, insulin)  [redacted] weeks gestation of pregnancy                [id number]  Obesity complicating pregnancy, second         O99.212  trimester (Pre-G BMI 30.3)  Cervical cerclage suture present, second       O34.32  trimester (12/21/21)  Poor obstetric history: Previous preterm       O09.219  delivery, antepartum (PPROM, 20wks)  Asthma                                         O99.89 j45.909  LR NIPS/AFP Neg/Horizon Neg ---------------------------------------------------------------------- Fetal Evaluation  Num Of Fetuses:         1  Fetal Heart Rate(bpm):  146  Cardiac Activity:       Observed  Presentation:           Cephalic  Placenta:                Posterior  P. Cord Insertion:      Previously visualized  Amniotic Fluid  AFI FV:      Within normal limits                              Largest Pocket(cm)                              3.22 ---------------------------------------------------------------------- OB History  Gravidity:    3         Term:   0  Prem:   1        SAB:   1  TOP:          0       Ectopic:  0        Living: 1 ---------------------------------------------------------------------- Gestational Age  LMP:           30w 0d        Date:  08/28/21                   EDD:   06/04/22  Best:          27w 6d     Det. By:  U/S  (12/23/21)          EDD:   06/19/22 ---------------------------------------------------------------------- Anatomy  Cranium:               Appears normal         LVOT:                   Previously seen  Cavum:                 Previously             Aortic Arch:            Previously seen                         visualized  Ventricles:            Appears normal         Ductal Arch:            Previously seen  Choroid Plexus:        Previously seen        Diaphragm:              Appears normal  Cerebellum:            Previously seen        Stomach:                Appears normal, left                                                                        sided  Posterior Fossa:       Previously seen        Abdomen:                Previously seen  Nuchal Fold:           Previously seen        Abdominal Wall:         Previously seen  Face:                  Orbits and profile     Cord Vessels:           Previously seen                         previously seen  Lips:                  Previously seen  Kidneys:                Appear normal  Palate:                Previously             Bladder:                Appears normal                         visualized  Thoracic:              Appears normal         Spine:                  Not well visualized                                                                        (Trv views  normal)  Heart:                 Appears normal         Upper Extremities:      Previously seen                         (4CH, axis, and                         situs)  RVOT:                  Previously seen        Lower Extremities:      Previously seen  Other:  Female gender, Nasal bone, lenses, Heels/feet, open hands/5th digits          prev visualized. Technically difficult due to maternal habitus and fetal          position. ---------------------------------------------------------------------- Doppler - Fetal Vessels  Umbilical Artery                                                              ADFV    RDFV                                                                Yes     Yes ---------------------------------------------------------------------- Cervix Uterus Adnexa  Cervix  Not visualized (advanced GA >24wks)  Uterus  No abnormality visualized.  Right Ovary  Within normal limits.  Left Ovary  Within normal limits.  Cul De Sac  No free fluid seen.  Adnexa  No adnexal mass visualized ---------------------------------------------------------------------- Comments  Bridget Scott has been followed due to severe IUGR.  She  is currently at 27 weeks and 6 days.  Her most recent EFW  performed  4 days ago was 522 g (1 pound 2 ounces, less  than the 1st percentile).  Intermittent absent end-diastolic flow  has also been noted on her umbilical artery Doppler studies.  She received a complete course of antenatal corticosteroids  last week.  She denies any problems since her last exam and reports  feeling fetal movements throughout the day.  Her blood pressures remain mildly elevated 157/81 and  141/82.  Intermittent reversed end-diastolic flow was noted on today's  umbilical artery Doppler studies.  There was normal amniotic  fluid noted.  Fetal movements were noted throughout today's ultrasound  exam.  She had an NST following today's ultrasound exam that did  not show any decelerations.  There was minimal variability   and  one 10 x 10 accelerations noted.  The increased risk of a fetal demise due to severe IUGR with  reversed end-diastolic flow was discussed.  After an extensive discussion, the patient and her husband  agreed to be admitted to the hospital later this evening for  continuous monitoring.  Once she is admitted, she should be placed on continuous  monitoring, receive magnesium sulfate for fetal  neuroprotection, and receive a rescue course (2 more doses)  of betamethasone.  She should also have a NICU consult.  We will repeat the umbilical artery Doppler studies again in 2  days (on 'Sunday morning).  Should persistent reversed end-diastolic flow be noted on  that exam, delivery will be recommended.  Delivery is recommended at any time should persistent  decelerations be noted on her fetal heart rate tracing.  As the fetus is unlikely to tolerate the labor process, she  should probably be delivered via C-section.  The patient has stated that she would like a tubal ligation  performed at the time of her cesarean delivery.  She has  signed all necessary state consent forms for the tubal ligation  during her prenatal visits.  She understands that a tubal ligation is permanent.  She  would like the tubal ligation performed regardless of the  outcome of her current pregnancy.  She stated that all of her questions were answered today.  A total of 20 minutes was spent counseling and coordinating  the care for this patient.  Greater than 50% of the time was  spent in direct face-to-face contact. ----------------------------------------------------------------------                  Victor Fang, MD Electronically Signed Final Report   03/26/2022 05:32 pm ----------------------------------------------------------------------  US MFM UA CORD DOPPLER  Result Date: 03/24/2022 ----------------------------------------------------------------------  OBSTETRICS REPORT                       (Signed Final 03/24/2022 05:35 pm)  ---------------------------------------------------------------------- Patient Info  ID #:       [id number]                          D.O.B.:  08/17/1980 (41 yrs)  Name:       Bridget Scott                Visit Date: 03/24/2022 02:58 pm ---------------------------------------------------------------------- Performed By  Attending:        Burk Schaible DO       Ref. Address:     [address]  Performed By:     Dorena Dew     [facility]                    BS, RDMS                                 Fetal Care at                                                             [place] for                                                             Women  Referred By:      [facility] ---------------------------------------------------------------------- Orders  #  Description                           Code        Ordered By  1  [place] MFM UA CORD DOPPLER                [phone]    Peterson Ao  2  [place] MFM OB LIMITED                     [id number]    YU FANG ----------------------------------------------------------------------  #  Order #                     Accession #                Episode #  1  [id number]                   [id number]                 [id number]  2  [id number]                   [id number]                 [id number] ---------------------------------------------------------------------- Indications  Maternal care for known or suspected poor      O36.5920  fetal growth, second trimester, not applicable  or unspecified IUGR  Absent end diastolic flow (Fetal and           O36.90X0  placental problem in pregnancy)  [redacted] weeks gestation of pregnancy                Z3A.27  Hypertension - Chronic/Pre-existing            O10.019  (Procardia)  Pre-existing diabetes, type 2, in pregnancy,   O24.112  second trimester (metformin, insulin)  Advanced maternal age multigravida 80+,        O20.522  second trimester (40 yrs)  Obesity  complicating pregnancy, second         O99.212  trimester (Pre-G BMI 30.3)  Pre-existing essential hypertension  0000000  complicating pregnancy, second trimester  (procardia)  Cervical cerclage suture present, second       O34.32  trimester (12/21/21)  Asthma                                         O99.89 j45.909  Poor obstetric history: Previous preterm       O09.219  delivery, antepartum (PPROM, 20wks)  LR NIPS/AFP Neg/Horizon Neg  Encounter for other antenatal screening        Z36.2  follow-up ---------------------------------------------------------------------- Fetal Evaluation  Num Of Fetuses:         1  Fetal Heart Rate(bpm):  141  Cardiac Activity:       Observed  Presentation:           Cephalic  Placenta:               Posterior  P. Cord Insertion:      Previously visualized  Amniotic Fluid  AFI FV:      Subjectively low-normal                              Largest Pocket(cm)                              3.44 ---------------------------------------------------------------------- OB History  Gravidity:    3         Term:   0        Prem:   1        SAB:   1  TOP:          0       Ectopic:  0        Living: 1 ---------------------------------------------------------------------- Gestational Age  LMP:           29w 5d        Date:  08/28/21                   EDD:   06/04/22  Best:          27w 4d     Det. By:  U/S  (12/23/21)          EDD:   06/19/22 ---------------------------------------------------------------------- Doppler - Fetal Vessels  Umbilical Artery   S/D     %tile      RI    %tile      PI    %tile     PSV    ADFV    RDFV                                                     (cm/s)   5.81   > 97.5    0.83   > 97.5    1.89   > 97.5    40.37     Yes      No  Comment:    Intermittent absent EDF ---------------------------------------------------------------------- Comments  MFM Consult Note  Patient Name: Bridget Scott  Patient MRN:   [id number]  Referring provider: Osborne Oman, MD  [address]  [place],  [place] [address]  Reason for Consult: FGR  HPI: Bridget Scott is a 42 y.o. G3P0110 at [redacted]w[redacted]d here for ultrasound and consultation.  The patient was seen in the office today for fetal growth  restriction.  She has been offered admission due to  intermittent absent reversed end-diastolic flow on previous  ultrasounds but declined due to child care.  Today her blood  pressures are elevated in the 160s over 80s with a repeat in  the 150s over 90s.  She is asymptomatic from a  preeclampsia perspective.  I discussed that if she is able to  go to the MAU and has a normal fetal tracing for at least 1 to  2 hours in addition to resolution of her high range blood  pressures and normal labs then consideration of discharge  can be done.  However, I also discussed that admission to  the hospital is very reasonable given the increased risk of  stillbirth.  If she is not admitted she should have twice weekly  outpatient antenatal testing.  I notified  the MAU provider  regarding her arrival.  We discussed at length that there is no  strong evidence to say that hospitalization of intermittent  absent end-diastolic flow with fetal growth restriction  improved outcomes.  I also discussed there is a social and  medical cost association with admission to the hospital.  I  also discussed that this does allow for more frequent  monitoring and may decrease the risk of stillbirth although  this is unproven.  I discussed that ultimately I would defer to  the patient's comfort level and support her decision.  She is  going to make her decision after she is evaluated at the MAU.  Review of Systems: A review of systems was performed and  was negative except per HPI  Vitals and Physical Exam  BP 160/88 with a repeat of 150/90, HR 69, pregravid BMI:  30.2.  Sitting comfortably on the sonogram table  Nonlabored breathing  Normal rate and rhythm  Abdomen is nontender  Genetic testing: lwo risk NIPS  Sonographic findings:  Single  intrauterine pregnancy.  Fetal cardiac activity:  Observed and appears normal.  Presentation: Cephalic.  Interval fetal anatomy appears normal.  Amniotic fluid volume: Subjectively low-norma. MVP: 3.44 cm.  Placenta: Posterior.  Umbilical artery dopplers findings: Intermittent absent end-  diastolic flow.  No reversed flow.  Assessment  - FGR, periods of end diastolic flow  - Elevated BP  Plan  -Sent to the MAU for preeclampsia rule out and consideration  of admission due to end-diastolic flow seen intermittently on  ultrasound  -She should have 1 to 2 hours of fetal heart tracing  -Preeclampsia labs (CBC, CMP, UPC)  -Consideration of admission if the blood pressure, laboratory  evaluation or fetal heart tracing is abnormal. ----------------------------------------------------------------------                  BValeda Malm DO Electronically Signed Final Report   03/24/2022 05:35 pm ----------------------------------------------------------------------  [place] OB LIMITED  Result Date: 03/24/2022 ----------------------------------------------------------------------  OBSTETRICS REPORT                       (Signed Final 03/24/2022 05:35 pm) ---------------------------------------------------------------------- Patient Info  ID #:       [id number]                         D.O.B.:  021-Dec-1982(41 yrs)  Name:  Bridget Scott                Visit Date: 03/24/2022 02:58 pm ---------------------------------------------------------------------- Performed By  Attending:        Valeda Malm DO       Ref. Address:     [place]  Performed By:     Dorena Dew     [facility]                                                             [facility]                                                             [facility]  Referred By:      [place] ---------------------------------------------------------------------- Orders  #  Description                           Code        Ordered By  1  [place] MFM UA CORD DOPPLER                [phone]    YU FANG  2  [place] MFM OB LIMITED                     [id number]    YU FANG ----------------------------------------------------------------------  #  Order #                     Accession #                Episode #  1  [id number]                   [id number]                 [id number]  2  [id number]                   [id number]                 [id number] ---------------------------------------------------------------------- Indications  Maternal care for known or suspected poor      O36.5920  fetal growth, second trimester, not applicable  or unspecified IUGR  Absent end diastolic flow (Fetal and  O36.90X0  placental problem in pregnancy)  [redacted] weeks gestation of pregnancy                Z3A.27  Hypertension - Chronic/Pre-existing            O10.019  (Procardia)  Pre-existing diabetes, type 2, in pregnancy,   O24.112  second trimester (metformin, insulin)  Advanced maternal age multigravida 63+,        O62.522  second trimester (89 yrs)  Obesity complicating pregnancy, second         O99.212  trimester (Pre-G BMI 30.3)  Pre-existing essential hypertension            0000000  complicating pregnancy, second trimester  (procardia)  Cervical cerclage suture present, second       O34.32  trimester (12/21/21)  Asthma                                         O99.89 j45.909  Poor obstetric history: Previous preterm       O09.219  delivery, antepartum (PPROM, 20wks)  LR NIPS/AFP Neg/Horizon Neg  Encounter for other antenatal screening        Z36.2  follow-up ---------------------------------------------------------------------- Fetal Evaluation  Num Of Fetuses:         1  Fetal Heart Rate(bpm):  141  Cardiac Activity:       Observed  Presentation:           Cephalic  Placenta:               Posterior  P. Cord Insertion:       Previously visualized  Amniotic Fluid  AFI FV:      Subjectively low-normal                              Largest Pocket(cm)                              3.44 ---------------------------------------------------------------------- OB History  Gravidity:    3         Term:   0        Prem:   1        SAB:   1  TOP:          0       Ectopic:  0        Living: 1 ---------------------------------------------------------------------- Gestational Age  LMP:           29w 5d        Date:  08/28/21                   EDD:   06/04/22  Best:          27w 4d     Det. By:  U/S  (12/23/21)          EDD:   06/19/22 ---------------------------------------------------------------------- Doppler - Fetal Vessels  Umbilical Artery   S/D     %tile      RI    %tile      PI    %tile     PSV    ADFV    RDFV                                                     (  cm/s)   5.81   > 97.5    0.83   > 97.5    1.89   > 97.5    40.37     Yes      No  Comment:    Intermittent absent EDF ---------------------------------------------------------------------- Comments  MFM Consult Note  Patient Name: Bridget Scott  Patient MRN:   [id number]  Referring provider: Osborne Oman, MD  [address]  [place],  [place] [address]  Reason for Consult: FGR  HPI: Bridget Scott is a 42 y.o. G3P0110 at [redacted]w[redacted]d here for ultrasound and consultation.  The patient was seen in the office today for fetal growth  restriction.  She has been offered admission due to  intermittent absent reversed end-diastolic flow on previous  ultrasounds but declined due to child care.  Today her blood  pressures are elevated in the 160s over 80s with a repeat in  the 150s over 90s.  She is asymptomatic from a  preeclampsia perspective.  I discussed that if she is able to  go to the MAU and has a normal fetal tracing for at least 1 to  2 hours in addition to resolution of her high range blood  pressures and normal labs then consideration of discharge  can be done.  However, I also discussed that  admission to  the hospital is very reasonable given the increased risk of  stillbirth.  If she is not admitted she should have twice weekly  outpatient antenatal testing.  I notified  the MAU provider  regarding her arrival.  We discussed at length that there is no  strong evidence to say that hospitalization of intermittent  absent end-diastolic flow with fetal growth restriction  improved outcomes.  I also discussed there is a social and  medical cost association with admission to the hospital.  I  also discussed that this does allow for more frequent  monitoring and may decrease the risk of stillbirth although  this is unproven.  I discussed that ultimately I would defer to  the patient's comfort level and support her decision.  She is  going to make her decision after she is evaluated at the MAU.  Review of Systems: A review of systems was performed and  was negative except per HPI  Vitals and Physical Exam  BP 160/88 with a repeat of 150/90, HR 69, pregravid BMI:  30.2.  Sitting comfortably on the sonogram table  Nonlabored breathing  Normal rate and rhythm  Abdomen is nontender  Genetic testing: lwo risk NIPS  Sonographic findings:  Single intrauterine pregnancy.  Fetal cardiac activity:  Observed and appears normal.  Presentation: Cephalic.  Interval fetal anatomy appears normal.  Amniotic fluid volume: Subjectively low-norma. MVP: 3.44 cm.  Placenta: Posterior.  Umbilical artery dopplers findings: Intermittent absent end-  diastolic flow.  No reversed flow.  Assessment  - FGR, periods of end diastolic flow  - Elevated BP  Plan  -Sent to the MAU for preeclampsia rule out and consideration  of admission due to end-diastolic flow seen intermittently on  ultrasound  -She should have 1 to 2 hours of fetal heart tracing  -Preeclampsia labs (CBC, CMP, UPC)  -Consideration of admission if the blood pressure, laboratory  evaluation or fetal heart tracing is abnormal.  ----------------------------------------------------------------------                  BValeda Malm DO Electronically Signed Final Report   03/24/2022 05:35 pm ----------------------------------------------------------------------  Medications:  Scheduled  famotidine  20 mg Oral BID   insulin aspart  0-15 Units Subcutaneous 5 X Daily   insulin glargine-yfgn  13 Units Subcutaneous QHS   metFORMIN  1,000 mg Oral BID WC   NIFEdipine  90 mg Oral Daily   prenatal multivitamin  1 tablet Oral Q1200   I have reviewed the patient's current medications.  ASSESSMENT: G3P0110 40w0dEstimated Date of Delivery: 06/19/22  FGR, severe + early, <1% with IR UAD   Patient Active Problem List   Diagnosis Date Noted   Fetal growth restriction antepartum 03/27/2022   Severe IUGR (intrauterine growth restriction) affecting care of mother, second trimester 03/02/2022   Cervical cerclage suture present 02/01/2022   Rubella non-immune status, antepartum 12/28/2021   Type 2 diabetes mellitus affecting pregnancy, antepartum 12/23/2021   Chronic hypertension affecting pregnancy 12/23/2021   History of preterm premature rupture of membranes (PPROM) at 29weeks 12/15/2021   Supervision of high-risk pregnancy 12/15/2021   Advanced maternal age in multigravida 12/15/2021   Chronic bronchitis ([place] 11/11/2021   Cervical radiculopathy 04/09/2021   History of trauma 03/21/2019   Major depressive disorder, recurrent episode, severe with anxious distress ([place] 03/21/2019   Panic attacks 03/21/2019   Tobacco use disorder 05/27/2017   Hypertension    Diabetes mellitus without complication ([place]    Asthma affecting pregnancy in second trimester    GERD (gastroesophageal reflux disease)    Obesity 04/26/2013   Hodgkin's disease in remission ([place] 10/03/2012    PLAN: If decels deliver by C section If continuous reverese flow, deliver by C section  Pt wants a tubal no matter  Rescue steroid Magnesium  prophylaxis for ICH Continuous monitoring NICU consult is done  Dopplers pending today  LFlorian Buff

## 2022-03-28 NOTE — Consult Note (Signed)
MFM Note  Bridget Scott is currently at 28 weeks and 1 day.  She was sent in to the hospital 2 days ago to receive a rescue course of steroids and magnesium sulfate for fetal neuroprotection.    Her fetal heart rate tracing has been reactive overall over the past 2 days.  The patient reports feeling fetal movements throughout the day.  She had a repeat ultrasound performed this morning that showed intermittent absent end-diastolic flow.  There were no signs of reversed end-diastolic flow noted today.  Surprisingly, the ultrasound even showed some diastolic flow. There was normal amniotic fluid noted today.  Fetal movements were noted throughout today's exam.  A BPP performed today was 8 out of 10 with a reactive NST.  She received a -2 for fetal breathing movements that did not meet criteria.  As the fetal status has been reassuring over the past 48 hours along with the slight improvement in her umbilical artery Doppler studies, the patient is requesting to be discharged home.  She was advised that this is reasonable, although the risk of a fetal demise is still present.  I will schedule her to come to the MFM office for repeat umbilical artery Doppler studies in 2 days (Tuesday).  We will continue to follow her with umbilical artery Doppler studies 2-3 times a week until delivery.  Fetal kick count instructions were reviewed.    The patient understands that she may be sent back into the hospital at any time should there be any further concerns regarding the fetal status during her future ultrasound exams.  She understands that should persistent reversed end-diastolic flow be noted later in her pregnancy, that we will restart magnesium sulfate for fetal neuroprotection (prior to 32 weeks) and proceed with delivery at that time.    She was encouraged to maintain the best glycemic control as possible.  She was advised to avoid eating candy and drinking soda with large amounts of sugar.  She will  call me tomorrow to report her fingerstick values.  I will help her make further adjustments to her insulin dosage via the phone.  I will have someone from our office contact her tomorrow to schedule an appointment in 2 days.    The patient stated that all of her questions and concerns were addressed today.

## 2022-03-30 ENCOUNTER — Ambulatory Visit (HOSPITAL_BASED_OUTPATIENT_CLINIC_OR_DEPARTMENT_OTHER): Payer: Medicaid Other

## 2022-03-30 ENCOUNTER — Encounter: Payer: Self-pay | Admitting: *Deleted

## 2022-03-30 ENCOUNTER — Ambulatory Visit: Payer: Medicaid Other | Attending: Obstetrics and Gynecology | Admitting: *Deleted

## 2022-03-30 ENCOUNTER — Other Ambulatory Visit: Payer: Self-pay | Admitting: Obstetrics

## 2022-03-30 ENCOUNTER — Ambulatory Visit: Payer: Medicaid Other | Attending: Obstetrics | Admitting: Obstetrics

## 2022-03-30 VITALS — BP 142/84 | HR 69

## 2022-03-30 DIAGNOSIS — Z3A28 28 weeks gestation of pregnancy: Secondary | ICD-10-CM | POA: Diagnosis not present

## 2022-03-30 DIAGNOSIS — O24113 Pre-existing diabetes mellitus, type 2, in pregnancy, third trimester: Secondary | ICD-10-CM | POA: Diagnosis not present

## 2022-03-30 DIAGNOSIS — E119 Type 2 diabetes mellitus without complications: Secondary | ICD-10-CM

## 2022-03-30 DIAGNOSIS — O43893 Other placental disorders, third trimester: Secondary | ICD-10-CM

## 2022-03-30 DIAGNOSIS — Z794 Long term (current) use of insulin: Secondary | ICD-10-CM | POA: Diagnosis not present

## 2022-03-30 DIAGNOSIS — Z362 Encounter for other antenatal screening follow-up: Secondary | ICD-10-CM | POA: Diagnosis not present

## 2022-03-30 DIAGNOSIS — O99513 Diseases of the respiratory system complicating pregnancy, third trimester: Secondary | ICD-10-CM | POA: Insufficient documentation

## 2022-03-30 DIAGNOSIS — O10013 Pre-existing essential hypertension complicating pregnancy, third trimester: Secondary | ICD-10-CM | POA: Insufficient documentation

## 2022-03-30 DIAGNOSIS — Z2839 Other underimmunization status: Secondary | ICD-10-CM

## 2022-03-30 DIAGNOSIS — O365930 Maternal care for other known or suspected poor fetal growth, third trimester, not applicable or unspecified: Secondary | ICD-10-CM | POA: Diagnosis not present

## 2022-03-30 DIAGNOSIS — O09293 Supervision of pregnancy with other poor reproductive or obstetric history, third trimester: Secondary | ICD-10-CM | POA: Diagnosis not present

## 2022-03-30 DIAGNOSIS — O09523 Supervision of elderly multigravida, third trimester: Secondary | ICD-10-CM

## 2022-03-30 DIAGNOSIS — O09213 Supervision of pregnancy with history of pre-term labor, third trimester: Secondary | ICD-10-CM

## 2022-03-30 DIAGNOSIS — O365920 Maternal care for other known or suspected poor fetal growth, second trimester, not applicable or unspecified: Secondary | ICD-10-CM

## 2022-03-30 DIAGNOSIS — O09513 Supervision of elderly primigravida, third trimester: Secondary | ICD-10-CM | POA: Insufficient documentation

## 2022-03-30 DIAGNOSIS — O10913 Unspecified pre-existing hypertension complicating pregnancy, third trimester: Secondary | ICD-10-CM | POA: Diagnosis not present

## 2022-03-30 DIAGNOSIS — Z7984 Long term (current) use of oral hypoglycemic drugs: Secondary | ICD-10-CM

## 2022-03-30 DIAGNOSIS — J45909 Unspecified asthma, uncomplicated: Secondary | ICD-10-CM | POA: Insufficient documentation

## 2022-03-30 DIAGNOSIS — O24119 Pre-existing diabetes mellitus, type 2, in pregnancy, unspecified trimester: Secondary | ICD-10-CM

## 2022-03-30 DIAGNOSIS — O10919 Unspecified pre-existing hypertension complicating pregnancy, unspecified trimester: Secondary | ICD-10-CM

## 2022-03-30 LAB — CULTURE, BETA STREP (GROUP B ONLY)

## 2022-03-30 LAB — OB RESULTS CONSOLE GBS: GBS: NEGATIVE

## 2022-03-30 NOTE — Progress Notes (Signed)
MFM Note  Bridget Scott has been followed due to a severe IUGR fetus with absent end-diastolic flow noted on her umbilical artery Doppler studies.  She was discharged home from the hospital this past weekend after she received magnesium sulfate and a rescue course of antenatal corticosteroids.  Her fetal heart rate tracing was reactive during her time in the hospital.   She reports feeling vigorous fetal movements throughout the day.  The patient was discharged home with instructions to take 20 units of Semglee (long-acting) insulin at bedtime along with metformin 1000 mg twice a day.    I added 10 units of Semglee insulin in the morning as yesterday as her fingerstick values were still in the high 100s to the low 200s range.    She reports that her fasting fingerstick value was 123 this morning and her 2-hour postprandial fingersticks last evening were in the 150s range.  Fetal body movements and fetal breathing movements were noted throughout today's exam.  There was normal amniotic fluid noted with a total AFI of 12.39 cm.  Doppler studies of the umbilical arteries performed today continues to show absent end-diastolic flow the majority of the time.  An occasional reversed flow is noted.  This is a stable finding as compared to her prior exams.  As her umbilical artery Doppler studies are stable and the fetal status is reassuring, she will return in 3 days for another umbilical artery Doppler study.  She was advised to increase her Semglee insulin to 13 units in the morning and 20 units in the evening.  She should continue the metformin 1000 mg twice a day.  She will call me tomorrow to let me know what her fingerstick values are.  I will continue to help her make adjustments to her insulin dosage over the telephone.  Fetal kick count instructions were reviewed again today.  The patient understands that delivery will be recommended should there be any further worsening of her umbilical  artery Doppler studies at her next exam.    The goal for her delivery will be at between 29 to 30 weeks.    The patient is comfortable and agrees with this plan.    Another umbilical artery Doppler study was scheduled in 3 days.  A total of 20 minutes was spent counseling and coordinating the care for this patient.  Greater than 50% of the time was spent in direct face-to-face contact.

## 2022-03-31 ENCOUNTER — Ambulatory Visit (INDEPENDENT_AMBULATORY_CARE_PROVIDER_SITE_OTHER): Payer: Medicaid Other | Admitting: Family Medicine

## 2022-03-31 VITALS — BP 143/85 | HR 82 | Wt 194.0 lb

## 2022-03-31 DIAGNOSIS — F332 Major depressive disorder, recurrent severe without psychotic features: Secondary | ICD-10-CM

## 2022-03-31 DIAGNOSIS — O10919 Unspecified pre-existing hypertension complicating pregnancy, unspecified trimester: Secondary | ICD-10-CM

## 2022-03-31 DIAGNOSIS — Z3A28 28 weeks gestation of pregnancy: Secondary | ICD-10-CM

## 2022-03-31 DIAGNOSIS — O24113 Pre-existing diabetes mellitus, type 2, in pregnancy, third trimester: Secondary | ICD-10-CM

## 2022-03-31 DIAGNOSIS — O09893 Supervision of other high risk pregnancies, third trimester: Secondary | ICD-10-CM

## 2022-03-31 DIAGNOSIS — O09523 Supervision of elderly multigravida, third trimester: Secondary | ICD-10-CM

## 2022-03-31 DIAGNOSIS — O0993 Supervision of high risk pregnancy, unspecified, third trimester: Secondary | ICD-10-CM

## 2022-03-31 DIAGNOSIS — O365930 Maternal care for other known or suspected poor fetal growth, third trimester, not applicable or unspecified: Secondary | ICD-10-CM

## 2022-03-31 DIAGNOSIS — O10913 Unspecified pre-existing hypertension complicating pregnancy, third trimester: Secondary | ICD-10-CM

## 2022-03-31 DIAGNOSIS — O365920 Maternal care for other known or suspected poor fetal growth, second trimester, not applicable or unspecified: Secondary | ICD-10-CM

## 2022-03-31 DIAGNOSIS — Z2839 Other underimmunization status: Secondary | ICD-10-CM

## 2022-03-31 DIAGNOSIS — O09899 Supervision of other high risk pregnancies, unspecified trimester: Secondary | ICD-10-CM

## 2022-03-31 DIAGNOSIS — O24119 Pre-existing diabetes mellitus, type 2, in pregnancy, unspecified trimester: Secondary | ICD-10-CM

## 2022-03-31 NOTE — Progress Notes (Signed)
   PRENATAL VISIT NOTE  Subjective:  Bridget Scott is a 42 y.o. G3P0110 at 52w4dbeing seen today for ongoing prenatal care.  She is currently monitored for the following issues for this high-risk pregnancy and has Hypertension; Diabetes mellitus without complication ([place]; Asthma affecting pregnancy in second trimester; GERD (gastroesophageal reflux disease); Tobacco use disorder; History of trauma; Hodgkin's disease in remission ([place]; Major depressive disorder, recurrent episode, severe with anxious distress (H[place]; Obesity; Panic attacks; History of preterm premature rupture of membranes (PPROM) at 20 weeks; Supervision of high-risk pregnancy; Advanced maternal age in multigravida; Cervical radiculopathy; Chronic bronchitis (HW[place]; Type 2 diabetes mellitus affecting pregnancy, antepartum; Chronic hypertension affecting pregnancy; Rubella non-immune status, antepartum; Cervical cerclage suture present; Severe IUGR (intrauterine growth restriction) affecting care of mother, second trimester; and Fetal growth restriction antepartum on their problem list.  Patient reports no complaints.   .  .  Movement: Present. Denies leaking of fluid.   The following portions of the patient's history were reviewed and updated as appropriate: allergies, current medications, past family history, past medical history, past social history, past surgical history and problem list.   Objective:   Vitals:   03/31/22 1557  BP: (!) 143/85  Pulse: 82  Weight: 194 lb (88 kg)    Fetal Status: Fetal Heart Rate (bpm): 146   Movement: Present     General:  Alert, oriented and cooperative. Patient is in no acute distress.  Skin: Skin is warm and dry. No rash noted.   Cardiovascular: Normal heart rate noted  Respiratory: Normal respiratory effort, no problems with respiration noted  Abdomen: Soft, gravid, appropriate for gestational age.  Pain/Pressure: Absent     Pelvic: Cervical exam deferred        Extremities:  Normal range of motion.     Mental Status: Normal mood and affect. Normal behavior. Normal judgment and thought content.   Assessment and Plan:  Pregnancy: G3P0110 at 249w4d. Chronic hypertension affecting pregnancy BP is ok today On Procardia and ASA  2. Type 2 diabetes mellitus affecting pregnancy, antepartum CBGs improved after increase in Semglee with MFM BMZ effect will continue to wear off  3. Supervision of high risk pregnancy in third trimester   4. Severe IUGR (intrauterine growth restriction) affecting care of mother, second trimester In 2x/week testing with MFM-- For Primary C-section at 29-30 weeks depending on testing  5. Rubella non-immune status, antepartum MMR pp  6. Major depressive disorder, recurrent episode, severe with anxious distress (HCOasisOn Buspar   7. Multigravida of advanced maternal age in third trimester LR NIPT  Preterm labor symptoms and general obstetric precautions including but not limited to vaginal bleeding, contractions, leaking of fluid and fetal movement were reviewed in detail with the patient. Please refer to After Visit Summary for other counseling recommendations.   Return in 1 week (on 04/07/2022).  Future Appointments  Date Time Provider DeSixteen Mile Stand3/08/2022  2:30 PM [facility]/08/2022  2:45 PM [facility] US5 [facility]3/15/2024  3:00 PM ToBerniece SalinesDO CVD-[facility] None  04/14/2022  3:30 PM PrDonnamae JudeMD [facility] CWHSt[facility]Cre    TaDonnamae JudeMD

## 2022-03-31 NOTE — Progress Notes (Signed)
MFM Note  Spoke to patient via telephone.    She reports that her fasting fingerstick value this morning was 121.    Her 2-hour postprandial fingerstick values were in the 150s range.    She was advised to increase her insulin dose to 15 units during the daytime and 22 units at bedtime.    She reports feeling vigorous fetal movements.    She will return in 2 days for another ultrasound exam.

## 2022-04-01 ENCOUNTER — Ambulatory Visit: Payer: Medicaid Other

## 2022-04-01 ENCOUNTER — Other Ambulatory Visit: Payer: Medicaid Other

## 2022-04-02 ENCOUNTER — Ambulatory Visit: Payer: Medicaid Other | Admitting: *Deleted

## 2022-04-02 ENCOUNTER — Inpatient Hospital Stay (HOSPITAL_BASED_OUTPATIENT_CLINIC_OR_DEPARTMENT_OTHER): Payer: Medicaid Other | Admitting: Obstetrics

## 2022-04-02 ENCOUNTER — Ambulatory Visit (HOSPITAL_BASED_OUTPATIENT_CLINIC_OR_DEPARTMENT_OTHER): Payer: Medicaid Other

## 2022-04-02 ENCOUNTER — Encounter (HOSPITAL_COMMUNITY): Payer: Self-pay | Admitting: Obstetrics and Gynecology

## 2022-04-02 ENCOUNTER — Inpatient Hospital Stay (HOSPITAL_COMMUNITY)
Admission: AD | Admit: 2022-04-02 | Discharge: 2022-04-06 | DRG: 783 | Disposition: A | Discharge status: Home / Self Care | Payer: Medicaid Other | Attending: Family Medicine | Admitting: Family Medicine

## 2022-04-02 ENCOUNTER — Other Ambulatory Visit: Payer: Self-pay | Admitting: Obstetrics

## 2022-04-02 VITALS — BP 167/97 | HR 85

## 2022-04-02 DIAGNOSIS — O99214 Obesity complicating childbirth: Secondary | ICD-10-CM | POA: Diagnosis present

## 2022-04-02 DIAGNOSIS — O365930 Maternal care for other known or suspected poor fetal growth, third trimester, not applicable or unspecified: Principal | ICD-10-CM | POA: Diagnosis present

## 2022-04-02 DIAGNOSIS — O10013 Pre-existing essential hypertension complicating pregnancy, third trimester: Secondary | ICD-10-CM | POA: Diagnosis not present

## 2022-04-02 DIAGNOSIS — O365920 Maternal care for other known or suspected poor fetal growth, second trimester, not applicable or unspecified: Secondary | ICD-10-CM | POA: Insufficient documentation

## 2022-04-02 DIAGNOSIS — Z8572 Personal history of non-Hodgkin lymphomas: Secondary | ICD-10-CM | POA: Diagnosis not present

## 2022-04-02 DIAGNOSIS — O24113 Pre-existing diabetes mellitus, type 2, in pregnancy, third trimester: Secondary | ICD-10-CM

## 2022-04-02 DIAGNOSIS — Z91040 Latex allergy status: Secondary | ICD-10-CM

## 2022-04-02 DIAGNOSIS — O09523 Supervision of elderly multigravida, third trimester: Secondary | ICD-10-CM | POA: Diagnosis not present

## 2022-04-02 DIAGNOSIS — Z2839 Other underimmunization status: Secondary | ICD-10-CM

## 2022-04-02 DIAGNOSIS — Z3A29 29 weeks gestation of pregnancy: Secondary | ICD-10-CM | POA: Diagnosis not present

## 2022-04-02 DIAGNOSIS — F419 Anxiety disorder, unspecified: Secondary | ICD-10-CM | POA: Diagnosis present

## 2022-04-02 DIAGNOSIS — O2412 Pre-existing diabetes mellitus, type 2, in childbirth: Secondary | ICD-10-CM | POA: Diagnosis present

## 2022-04-02 DIAGNOSIS — Z807 Family history of other malignant neoplasms of lymphoid, hematopoietic and related tissues: Secondary | ICD-10-CM

## 2022-04-02 DIAGNOSIS — O24119 Pre-existing diabetes mellitus, type 2, in pregnancy, unspecified trimester: Secondary | ICD-10-CM

## 2022-04-02 DIAGNOSIS — F17200 Nicotine dependence, unspecified, uncomplicated: Secondary | ICD-10-CM | POA: Diagnosis present

## 2022-04-02 DIAGNOSIS — Z3A28 28 weeks gestation of pregnancy: Secondary | ICD-10-CM

## 2022-04-02 DIAGNOSIS — O10919 Unspecified pre-existing hypertension complicating pregnancy, unspecified trimester: Secondary | ICD-10-CM

## 2022-04-02 DIAGNOSIS — O3433 Maternal care for cervical incompetence, third trimester: Secondary | ICD-10-CM | POA: Diagnosis present

## 2022-04-02 DIAGNOSIS — O1002 Pre-existing essential hypertension complicating childbirth: Secondary | ICD-10-CM | POA: Diagnosis present

## 2022-04-02 DIAGNOSIS — O43193 Other malformation of placenta, third trimester: Secondary | ICD-10-CM | POA: Diagnosis not present

## 2022-04-02 DIAGNOSIS — J45909 Unspecified asthma, uncomplicated: Secondary | ICD-10-CM

## 2022-04-02 DIAGNOSIS — Z8571 Personal history of Hodgkin lymphoma: Secondary | ICD-10-CM | POA: Diagnosis not present

## 2022-04-02 DIAGNOSIS — O99344 Other mental disorders complicating childbirth: Secondary | ICD-10-CM | POA: Diagnosis present

## 2022-04-02 DIAGNOSIS — O09899 Supervision of other high risk pregnancies, unspecified trimester: Secondary | ICD-10-CM | POA: Insufficient documentation

## 2022-04-02 DIAGNOSIS — O9952 Diseases of the respiratory system complicating childbirth: Secondary | ICD-10-CM | POA: Diagnosis present

## 2022-04-02 DIAGNOSIS — F332 Major depressive disorder, recurrent severe without psychotic features: Secondary | ICD-10-CM | POA: Diagnosis present

## 2022-04-02 DIAGNOSIS — O99334 Smoking (tobacco) complicating childbirth: Secondary | ICD-10-CM | POA: Diagnosis present

## 2022-04-02 DIAGNOSIS — F17210 Nicotine dependence, cigarettes, uncomplicated: Secondary | ICD-10-CM | POA: Diagnosis present

## 2022-04-02 DIAGNOSIS — E119 Type 2 diabetes mellitus without complications: Secondary | ICD-10-CM

## 2022-04-02 DIAGNOSIS — Z7984 Long term (current) use of oral hypoglycemic drugs: Secondary | ICD-10-CM

## 2022-04-02 DIAGNOSIS — Z9851 Tubal ligation status: Secondary | ICD-10-CM

## 2022-04-02 DIAGNOSIS — Z794 Long term (current) use of insulin: Secondary | ICD-10-CM

## 2022-04-02 DIAGNOSIS — Z98891 History of uterine scar from previous surgery: Secondary | ICD-10-CM

## 2022-04-02 DIAGNOSIS — O4592 Premature separation of placenta, unspecified, second trimester: Secondary | ICD-10-CM | POA: Diagnosis not present

## 2022-04-02 DIAGNOSIS — Z302 Encounter for sterilization: Secondary | ICD-10-CM | POA: Diagnosis not present

## 2022-04-02 DIAGNOSIS — O34211 Maternal care for low transverse scar from previous cesarean delivery: Secondary | ICD-10-CM | POA: Diagnosis not present

## 2022-04-02 DIAGNOSIS — O10913 Unspecified pre-existing hypertension complicating pregnancy, third trimester: Secondary | ICD-10-CM

## 2022-04-02 DIAGNOSIS — O99513 Diseases of the respiratory system complicating pregnancy, third trimester: Secondary | ICD-10-CM

## 2022-04-02 DIAGNOSIS — O43893 Other placental disorders, third trimester: Secondary | ICD-10-CM | POA: Diagnosis not present

## 2022-04-02 DIAGNOSIS — O09213 Supervision of pregnancy with history of pre-term labor, third trimester: Secondary | ICD-10-CM | POA: Diagnosis not present

## 2022-04-02 DIAGNOSIS — O09513 Supervision of elderly primigravida, third trimester: Secondary | ICD-10-CM

## 2022-04-02 DIAGNOSIS — O164 Unspecified maternal hypertension, complicating childbirth: Secondary | ICD-10-CM | POA: Diagnosis not present

## 2022-04-02 LAB — TYPE AND SCREEN
ABO/RH(D): A POS
Antibody Screen: NEGATIVE

## 2022-04-02 LAB — COMPREHENSIVE METABOLIC PANEL
ALT: 13 U/L (ref 0–44)
AST: 16 U/L (ref 15–41)
Albumin: 2.8 g/dL — ABNORMAL LOW (ref 3.5–5.0)
Alkaline Phosphatase: 71 U/L (ref 38–126)
Anion gap: 10 (ref 5–15)
BUN: 8 mg/dL (ref 6–20)
CO2: 17 mmol/L — ABNORMAL LOW (ref 22–32)
Calcium: 9.7 mg/dL (ref 8.9–10.3)
Chloride: 106 mmol/L (ref 98–111)
Creatinine, Ser: 0.43 mg/dL — ABNORMAL LOW (ref 0.44–1.00)
GFR, Estimated: >60 mL/min (ref >60)
Glucose, Bld: 141 mg/dL — ABNORMAL HIGH (ref 70–99)
Potassium: 3.7 mmol/L (ref 3.5–5.1)
Sodium: 133 mmol/L — ABNORMAL LOW (ref 135–145)
Total Bilirubin: 0.4 mg/dL (ref 0.3–1.2)
Total Protein: 6 g/dL — ABNORMAL LOW (ref 6.5–8.1)

## 2022-04-02 LAB — CBC
HCT: 39.9 % (ref 36.0–46.0)
Hemoglobin: 13.4 g/dL (ref 12.0–15.0)
MCH: 30.9 pg (ref 26.0–34.0)
MCHC: 33.6 g/dL (ref 30.0–36.0)
MCV: 91.9 fL (ref 80.0–100.0)
Platelets: 193 10*3/uL (ref 150–400)
RBC: 4.34 MIL/uL (ref 3.87–5.11)
RDW: 13.6 % (ref 11.5–15.5)
WBC: 18.7 10*3/uL — ABNORMAL HIGH (ref 4.0–10.5)
nRBC: 0 % (ref 0.0–0.2)

## 2022-04-02 MED ORDER — MOMETASONE FURO-FORMOTEROL FUM 200-5 MCG/ACT IN AERO
2.0000 | INHALATION_SPRAY | Freq: Two times a day (BID) | RESPIRATORY_TRACT | Status: DC
  Filled 2022-04-02: qty 8.8

## 2022-04-02 MED ORDER — CEFAZOLIN SODIUM-DEXTROSE 2-4 GM/100ML-% IV SOLN
2.0000 g | INTRAVENOUS | Status: AC
  Administered 2022-04-03: 2 g via INTRAVENOUS
  Filled 2022-04-02: qty 100

## 2022-04-02 MED ORDER — ACETAMINOPHEN 325 MG PO TABS: 650.0000 mg | ORAL_TABLET | ORAL | Status: DC | PRN

## 2022-04-02 MED ORDER — MAGNESIUM SULFATE 40 GM/1000ML IV SOLN
2.0000 g/h | INTRAVENOUS | Status: DC
  Administered 2022-04-02: 2 g/h via INTRAVENOUS
  Filled 2022-04-02: qty 1000

## 2022-04-02 MED ORDER — FAMOTIDINE 20 MG PO TABS
20.0000 mg | ORAL_TABLET | Freq: Two times a day (BID) | ORAL | Status: DC
  Administered 2022-04-02: 20 mg via ORAL
  Filled 2022-04-02 (×2): qty 1

## 2022-04-02 MED ORDER — HYDROXYZINE HCL 50 MG PO TABS: 25.0000 mg | ORAL_TABLET | Freq: Three times a day (TID) | ORAL | Status: DC | PRN

## 2022-04-02 MED ORDER — PRENATAL MULTIVITAMIN CH: 1.0000 | ORAL_TABLET | Freq: Every day | ORAL | Status: DC

## 2022-04-02 MED ORDER — NICOTINE POLACRILEX 2 MG MT GUM: 2.0000 mg | CHEWING_GUM | OROMUCOSAL | Status: DC | PRN

## 2022-04-02 MED ORDER — ALBUTEROL SULFATE HFA 108 (90 BASE) MCG/ACT IN AERS: 1.0000 | INHALATION_SPRAY | Freq: Four times a day (QID) | RESPIRATORY_TRACT | Status: DC | PRN

## 2022-04-02 MED ORDER — MAGNESIUM SULFATE BOLUS VIA INFUSION
4.0000 g | Freq: Once | INTRAVENOUS | Status: AC
  Administered 2022-04-02: 4 g via INTRAVENOUS
  Filled 2022-04-02: qty 1000

## 2022-04-02 MED ORDER — CALCIUM CARBONATE ANTACID 500 MG PO CHEW: 2.0000 | CHEWABLE_TABLET | ORAL | Status: DC | PRN

## 2022-04-02 MED ORDER — LACTATED RINGERS IV SOLN: 125.0000 mL/h | INTRAVENOUS | Status: DC

## 2022-04-02 MED ORDER — BUSPIRONE HCL 10 MG PO TABS
10.0000 mg | ORAL_TABLET | Freq: Three times a day (TID) | ORAL | Status: DC
  Filled 2022-04-02 (×5): qty 1

## 2022-04-02 MED ORDER — FOLIC ACID 1 MG PO TABS: 1.0000 mg | ORAL_TABLET | Freq: Every day | ORAL | Status: DC

## 2022-04-02 MED ORDER — LACTATED RINGERS IV SOLN: INTRAVENOUS | Status: DC

## 2022-04-02 MED ORDER — NIFEDIPINE ER OSMOTIC RELEASE 60 MG PO TB24
90.0000 mg | ORAL_TABLET | Freq: Every day | ORAL | Status: DC
  Administered 2022-04-02: 90 mg via ORAL
  Filled 2022-04-02 (×2): qty 1

## 2022-04-02 MED ORDER — METFORMIN HCL 500 MG PO TABS
1000.0000 mg | ORAL_TABLET | Freq: Two times a day (BID) | ORAL | Status: DC
  Administered 2022-04-02: 1000 mg via ORAL
  Filled 2022-04-02: qty 2

## 2022-04-02 MED ORDER — TRANEXAMIC ACID-NACL 1000-0.7 MG/100ML-% IV SOLN: 1000.0000 mg | INTRAVENOUS | Status: DC

## 2022-04-02 MED ORDER — INSULIN GLARGINE-YFGN 100 UNIT/ML ~~LOC~~ SOLN
20.0000 [IU] | Freq: Every day | SUBCUTANEOUS | Status: DC
  Administered 2022-04-02: 20 [IU] via SUBCUTANEOUS
  Filled 2022-04-02 (×2): qty 0.2

## 2022-04-02 NOTE — H&P (Signed)
OBSTETRIC ADMISSION HISTORY AND PHYSICAL  Bridget Scott is a 42 y.o. female G82P0110 with IUP at 78w6dby 14 week ultrasound presenting for surveillance of growth restricted fetus and delivery in the AM.  Pt was seen by MFM with reversed end diastolic flow and fetal deceleration.  MFM advised continuous monitoring with magnesium sulfate neuroprotection with delivery by cesarean section in the AM. She reports +FMs, No LOF, no VB, no blurry vision, headaches or peripheral edema, and RUQ pain.  She plans on breast feeding. She requests tubal ligation/salpingectomy for birth control. [facility]  She received a cervical  cerclage earlier in the pregnancy.  Dating: By 14.4 week ultrasound --->  Estimated Date of Delivery: 06/19/22  Sono:    Multiple MFM scans 04/02/22:  AFI 7.72, EFW <1 % 546 grams   Prenatal History/Complications: severe IUGR, hx of PPROM at 234weeks, AMA, preexisting type 2 diabetes, CHTN, and hodgkin's disease in remission  Past Medical History: Past Medical History:  Diagnosis Date   Asthma    Cancer ([place]    lymphoma   Dental caries    periodontitis, maxillary cyst   Depression    [place] [place]    GERD (gastroesophageal reflux disease)    Headache    migraines   Hodgkin lymphoma ([place]    [facility]  Hypertension    Hypertensive urgency    Insomnia 03/21/2019   Pneumonia    as a baby   Seasonal allergies     Past Surgical History: Past Surgical History:  Procedure Laterality Date   CERVICAL CERCLAGE N/A 12/21/2021   Procedure: CERCLAGE CERVICAL;  Surgeon: BGriffin Basil MD;  Location: [facility] LD [facility]S;  Service: Gynecology;  Laterality: N/A;   CHOLECYSTECTOMY     LYMPH NODE BIOPSY     PORT-A-CATH REMOVAL     PORTACATH PLACEMENT     TOOTH EXTRACTION Bilateral 05/27/2017   Procedure: MULTIPLE EXTRACTIONS;  Surgeon: JDiona Browner  DDS;  [place]  Service: Oral Surgery;  Laterality: Bilateral;    Obstetrical History: OB History     Gravida  3   Para  1   Term      Preterm  1   AB  1   Living  0      SAB  1   IAB      Ectopic      Multiple      Live Births  0        Obstetric Comments  G1: PPROM, PTL         Social History Social History   Socioeconomic History   Marital status: Married    Spouse name: Not on file   Number of children: Not on file   Years of education: Not on file   Highest education level: Not on file  Occupational History   Not on file  Tobacco Use   Smoking status: Some Days    Packs/day: 0.25    Types: Cigarettes   Smokeless tobacco: Never  Vaping Use   Vaping Use: Never used  Substance and Sexual Activity   Alcohol use: No   Drug use: No   Sexual activity: Yes    Birth control/protection: None  Other Topics Concern   Not on file  Social History Narrative   Not on file   Social Determinants of Health   Financial  Resource Strain: Medium Risk (01/06/2021)   Overall Financial Resource Strain (CARDIA)    Difficulty of Paying Living Expenses: Somewhat hard  Food Insecurity: No Food Insecurity (03/26/2022)   Hunger Vital Sign    Worried About Running Out of Food in the Last Year: Never true    Ran Out of Food in the Last Year: Never true  Transportation Needs: No Transportation Needs (03/26/2022)   PRAPARE - Hydrologist (Medical): No    Lack of Transportation (Non-Medical): No  Physical Activity: Inactive (01/06/2021)   Exercise Vital Sign    Days of Exercise per Week: 0 days    Minutes of Exercise per Session: 0 min  Stress: Stress Concern Present (01/06/2021)   [place]    Feeling of Stress : Very much  Social Connections: Not on file    Family History: Family History  Problem Relation Age of Onset   Cancer Mother    Diabetes Mother     Asthma Mother    Hypertension Mother    Heart disease Mother    Lung cancer Mother    Cancer - Lung Mother    Hypertension Father    Heart disease Father    Heart attack Father    Cancer Sister    Hodgkin's lymphoma Sister    Cancer Maternal Aunt    Cancer Maternal Uncle    Hypertension Maternal Grandmother    Dementia Maternal Grandmother    Hyperlipidemia Paternal Grandmother    Hypertension Paternal Grandmother    Lung disease Paternal Grandmother     Allergies: Allergies  Allergen Reactions   Robitussin Dm Max Day-Night Hives and Itching    Other reaction(s): Dizziness, Flushing, Vomiting   Analgesic Balm [Trolamine (Triethanolamine)] Rash   Bengay Pain Relief [Menthol] Rash   Guaifenesin Hives, Rash, Other (See Comments) and Nausea And Vomiting    "Made me sick" (Robitussin)   Latex Rash    Medications Prior to Admission  Medication Sig Dispense Refill Last Dose   ADVAIR HFA 115-21 MCG/ACT inhaler Inhale 2 puffs into the lungs 2 (two) times daily.      albuterol (VENTOLIN HFA) 108 (90 Base) MCG/ACT inhaler Inhale 1-2 puffs into the lungs every 6 (six) hours as needed for wheezing or shortness of breath. 18 g 0    aspirin EC 81 MG tablet Take 1 tablet (81 mg total) by mouth daily. Start taking when you are [redacted] weeks pregnant for rest of pregnancy for prevention of preeclampsia 300 tablet 2    blood glucose meter kit and supplies KIT Dispense based on patient and insurance preference. Use up to four times daily as directed. (FOR ICD-9 250.00, 250.01). 1 each 0    busPIRone (BUSPAR) 5 MG tablet Take 10 mg by mouth 3 (three) times daily.      Cetirizine HCl 10 MG CAPS Take 1 capsule (10 mg total) by mouth daily for 10 days. (Patient taking differently: Take 10 mg by mouth daily as needed (allergies).) 10 capsule 0    famotidine (PEPCID) 20 MG tablet Take 1 tablet (20 mg total) by mouth 2 (two) times daily. 60 tablet 3    folic acid (FOLVITE) 1 MG tablet Take 1 tablet (1 mg  total) by mouth daily. 30 tablet 10    hydrOXYzine (ATARAX) 25 MG tablet Take 25 mg by mouth 3 (three) times daily as needed for anxiety.      insulin glargine (LANTUS) 100  UNIT/ML injection Inject 0.2 mLs (20 Units total) into the skin at bedtime. 10 mL 11    Insulin Pen Needle (NOVOFINE) 30G X 8 MM MISC Inject 10 each into the skin as needed. 100 each 1    metFORMIN (GLUCOPHAGE) 1000 MG tablet Take 1 tablet (1,000 mg total) by mouth 2 (two) times daily with a meal. 60 tablet 2    nicotine polacrilex (NICORETTE) 2 MG gum Take 2 mg by mouth as needed for smoking cessation.      NIFEdipine (PROCARDIA XL/NIFEDICAL-XL) 90 MG 24 hr tablet Take 1 tablet (90 mg total) by mouth daily. 30 tablet 3    Prenatal 28-0.8 MG TABS Take 1 tablet by mouth daily. 30 tablet 12      Review of Systems   All systems reviewed and negative except as stated in HPI  Blood pressure (!) 155/81, pulse 83, temperature 97.8 F (36.6 C), temperature source Oral, height '5\' 5"'$  (1.651 m), weight 88.9 kg, last menstrual period 08/28/2021, SpO2 99 %. General appearance: alert, cooperative, and no distress Lungs: clear to auscultation bilaterally Heart: regular rate and rhythm Abdomen: soft, non-tender; bowel sounds normal Pelvic: deferred Extremities: Homans sign is negative, no sign of DVT Presentation: cephalic Fetal monitoringBaseline: 140s bpm, Variability: moderate, Accelerations: Non-reactive but appropriate for gestational age, and Decelerations: Absent Uterine activityNone     Prenatal labs: ABO, Rh: --/--/PENDING (03/08 1642) Antibody: PENDING (03/08 1642) Rubella: <0.90 (11/29 1543) RPR: Non Reactive (11/29 1543)  HBsAg: Negative (11/29 1543)  HIV: Non Reactive (11/29 1543)  GBS:    Genetic screening  horizon and panorama normal Anatomy US severe IUGR  Prenatal Transfer Tool  Maternal Diabetes: Yes:  Diabetes Type:  Pre-pregnancy Genetic Screening: Normal Maternal Ultrasounds/Referrals: IUGR Fetal  Ultrasounds or other Referrals:  Fetal echo Maternal Substance Abuse:  No Significant Maternal Medications:  Meds include: Other:   procardia, semglee, metformin Significant Maternal Lab Results: None  Results for orders placed or performed during the hospital encounter of 04/02/22 (from the past 24 hour(s))  Type and screen [place]   Collection Time: 04/02/22  4:42 PM  Result Value Ref Range   ABO/RH(D) PENDING    Antibody Screen PENDING    Sample Expiration      04/05/2022,2359 Performed at [facility], [place] [address] [place], [place] [address]   CBC   Collection Time: 04/02/22  4:46 PM  Result Value Ref Range   WBC 18.7 (H) 4.0 - 10.5 K/uL   RBC 4.34 3.87 - 5.11 MIL/uL   Hemoglobin 13.4 12.0 - 15.0 g/dL   HCT 39.9 36.0 - 46.0 %   MCV 91.9 80.0 - 100.0 fL   MCH 30.9 26.0 - 34.0 pg   MCHC 33.6 30.0 - 36.0 g/dL   RDW 13.6 11.5 - 15.5 %   Platelets 193 150 - 400 K/uL   nRBC 0.0 0.0 - 0.2 %    Patient Active Problem List   Diagnosis Date Noted   Fetal growth restriction antepartum 03/27/2022   Severe IUGR (intrauterine growth restriction) affecting care of mother, second trimester 03/02/2022   Cervical cerclage suture present 02/01/2022   Rubella non-immune status, antepartum 12/28/2021   Type 2 diabetes mellitus affecting pregnancy, antepartum 12/23/2021   Chronic hypertension affecting pregnancy 12/23/2021   History of preterm premature rupture of membranes (PPROM) at 109 weeks 12/15/2021   Supervision of high-risk pregnancy 12/15/2021   Advanced maternal age in multigravida 12/15/2021   Chronic bronchitis ([place]) 11/11/2021  Cervical radiculopathy 04/09/2021   History of trauma 03/21/2019   Major depressive disorder, recurrent episode, severe with anxious distress ([place]) 03/21/2019   Panic attacks 03/21/2019   Tobacco use disorder 05/27/2017   Hypertension    Diabetes mellitus without complication ([place])    Asthma affecting pregnancy in  second trimester    GERD (gastroesophageal reflux disease)    Obesity 04/26/2013   Hodgkin's disease in remission ([place]) 10/03/2012    Assessment/Plan:  Bridget Scott is a 42 y.o. G3P0110 at 43w6dhere for magnesium neuroprotection and surveillance prior to delivery in the AM  The patient will be placed on continuous monitoring while receiving neuroprotection. If there are any recurrent decels or other ominous findings, the pt will be delivered expediently. Continue clear liquids, monitor blood sugars. NPO after midnight. Regardless of outcome the pt desires tubal ligation at time of cesarean section.  LGriffin Basil MD  04/02/2022, 5:49 PM

## 2022-04-02 NOTE — Progress Notes (Signed)
MFM Note  Bridget Scott has been followed due to severe IUGR with intermittent reversed end-diastolic flow noted on her umbilical artery Doppler studies.  She has received 2 courses (4 doses) of betamethasone.    She reports that her glycemic control has improved now that she is treated with metformin 1000 mg twice a day and Semglee insulin 15 units in the morning and 20 units in the evening.  Her blood pressures today were 161/95 and 167/97.  The EFW obtained today was 546 g (1 pound 3 ounces, < 1st percentile for her gestational age).  Low normal amniotic fluid with a total AFI of 7.71 cm was noted.  There has been minimal fetal growth noted over the past 2 weeks.  Doppler studies of the umbilical arteries performed today continues to show intermittent reversed end-diastolic flow.  During the performance of her ultrasound, it was noted that the fetal heart rate dropped down to the 80s range from the 140s range.  Fetal movements were noted throughout today's exam.  Due to the decelerations in the fetal heart rate and concerns regarding an IUFD, the patient was sent to the hospital immediately following today's ultrasound exam.  Due to severe IUGR, an attempt should be made to give her magnesium sulfate for at least 4 hours for fetal neuroprotection.    She should be placed on continuous monitoring.  Should the fetal heart rate tracing be reassuring, a cesarean delivery is recommended tomorrow morning after she has received magnesium sulfate for fetal neuroprotection.    A cesarean delivery is recommended as the fetus will most likely be unable to tolerate the labor process.   Should recurrent decelerations be noted on her fetal heart rate tracing, delivery should occur this evening.  The patient already had a NICU consult last weekend.  She was advised that hopefully, the neonatal outcome has been improved as we were able to get almost one additional week out of her pregnancy.     However, as the amniotic fluid has decreased and there is more reversed end-diastolic flow noted on her umbilical artery Doppler studies, it is probably time for delivery to avoid a fetal demise.    The patient is comfortable with the plan for delivery soon.  A total of 20 minutes was spent counseling and coordinating the care for this patient.  Greater than 50% of the time was spent in direct face-to-face contact.

## 2022-04-02 NOTE — Progress Notes (Signed)
Faculty Practice OB/GYN Attending Note  Subjective:  Patient is doing well. No concerns.  No contractions, no LOF or vaginal bleeding. Good FM reported.   Patient Active Problem List   Diagnosis Date Noted   Fetal growth restriction antepartum 03/27/2022   Severe IUGR (intrauterine growth restriction) affecting care of mother, second trimester 03/02/2022   Cervical cerclage suture present 02/01/2022   Rubella non-immune status, antepartum 12/28/2021   Type 2 diabetes mellitus affecting pregnancy, antepartum 12/23/2021   Chronic hypertension affecting pregnancy 12/23/2021   History of preterm premature rupture of membranes (PPROM) at 35 weeks 12/15/2021   Supervision of high-risk pregnancy 12/15/2021   Advanced maternal age in multigravida 12/15/2021   Chronic bronchitis ([place]) 11/11/2021   Cervical radiculopathy 04/09/2021   History of trauma 03/21/2019   Major depressive disorder, recurrent episode, severe with anxious distress ([place]) 03/21/2019   Panic attacks 03/21/2019   Tobacco use disorder 05/27/2017   Hypertension    Diabetes mellitus without complication ([place])    Asthma affecting pregnancy in second trimester    GERD (gastroesophageal reflux disease)    Obesity 04/26/2013   Hodgkin's disease in remission ([place]) 10/03/2012      Objective:  Blood pressure (!) 153/101, pulse 79, temperature 97.8 F (36.6 C), temperature source Oral, resp. rate 18, height '5\' 5"'$  (1.651 m), weight 88.9 kg, last menstrual period 08/28/2021, SpO2 99 %. FHT  Baseline 140 bpm, minimal -moderate variability, no accelerations, no decelerations Toco: no contractions Gen: NAD HENT: Normocephalic, atraumatic Lungs: Normal respiratory effort Heart: Regular rate noted Abdomen: NT, obese, gravid fundus, soft Cervix: Deferred Ext: 2+ DTRs, no edema, no cyanosis, negative Homan's sign  Assessment & Plan:  42 y.o. G3P0110 at 98w6dadmitted for surveillance of severely growth restricted fetus (04/02/22  EFW <1% 546 grams) with reversed end diastolic flow.  MFM advised continuous monitoring with magnesium sulfate neuroprotection with delivery by cesarean section planned for 04/03/22 morning. Currently, there is reassuring FHR tracing.  However, if there are any recurrent decels or other very concerning findings, she will be delivered expediently.   Of note, patient has expressed that she desires tubal sterilization regardless of outcome, she also has a cerclage in place that will need to be removed during delivery.  Discussed details of cesarean section with patient and her FOB who was in the room with her, explained that it will most likely be via classical hysterotomy.  The risks of cesarean section were discussed with the patient including but were not limited to: bleeding which may require transfusion or reoperation; infection which may require antibiotics; injury to bowel, bladder, ureters or other surrounding organs; injury to the fetus; need for additional procedures including hysterectomy in the event of a life-threatening hemorrhage; formation of adhesions; placental abnormalities with subsequent pregnancies; incisional problems; cardiac issues; thromboembolic phenomenon; death and other postoperative/anesthesia complications. Explained possibility of having to be under general anesthesia for emergent procedures, and explained that her FOB will not be in the OR if this is the case.  Patient also desires permanent sterilization.  This will be done either via bilateral salpingectomy or other method.  Risks of procedure discussed with patient including but not limited to: risk of regret, permanence of method, bleeding, infection, injury to surrounding organs and need for additional procedures.  Failure risk of about 1% with increased risk of ectopic gestation if pregnancy occurs was also discussed with patient.  Also discussed possibility of ppost-tubal syndrome with increased pelvic pain or menstrual  irregularities.  Discussed  risk of possible partial suture retention with cerclage removal. The patient concurred with the proposed plan, giving informed written consent for the procedures.  Signed consent placed in patient's chart.  Patient will be NPO after midnight. Anesthesiology, Neonatology and OR teams are aware of patient.  Preoperative prophylactic antibiotics, TXA and SCDs ordered on call to the OR.     Continue close observation.   Verita Schneiders, MD, [place] for [employer], [place]

## 2022-04-02 NOTE — Anesthesia Preprocedure Evaluation (Signed)
Anesthesia Evaluation  Patient identified by MRN, date of birth, ID band Patient awake    Reviewed: Allergy & Precautions, NPO status , Patient's Chart, lab work & pertinent test results, reviewed documented beta blocker date and time   Airway Mallampati: III       Dental  (+) Poor Dentition, Dental Advisory Given   Pulmonary asthma , pneumonia, resolved, Current Smoker and Patient abstained from smoking.   Pulmonary exam normal breath sounds clear to auscultation       Cardiovascular hypertension, Pt. on medications Normal cardiovascular exam Rhythm:Regular Rate:Normal     Neuro/Psych  Headaches PSYCHIATRIC DISORDERS Anxiety Depression    Hx/o panic attacks Neuromuscular disease    GI/Hepatic Neg liver ROS,GERD  Medicated,,  Endo/Other  diabetes, Poorly Controlled, Type 2, Insulin Dependent, Oral Hypoglycemic Agents  Obesity  Renal/GU negative Renal ROS  negative genitourinary   Musculoskeletal negative musculoskeletal ROS (+)    Abdominal  (+) + obese  Peds  Hematology Hx/o Hodgkin's disease S/P ChemoRx 2013, in remission   Anesthesia Other Findings   Reproductive/Obstetrics (+) Pregnancy Severe IUGR AMA Desires sterilization                              Anesthesia Physical Anesthesia Plan  ASA: 3  Anesthesia Plan: Spinal   Post-op Pain Management: Minimal or no pain anticipated and Regional block*   Induction: Intravenous  PONV Risk Score and Plan: 3 and Treatment may vary due to age or medical condition, Scopolamine patch - Pre-op and Ondansetron  Airway Management Planned: Natural Airway  Additional Equipment: Fetal Monitoring  Intra-op Plan:   Post-operative Plan:   Informed Consent: I have reviewed the patients History and Physical, chart, labs and discussed the procedure including the risks, benefits and alternatives for the proposed anesthesia with the patient or  authorized representative who has indicated his/her understanding and acceptance.     Dental advisory given  Plan Discussed with: CRNA and Anesthesiologist  Anesthesia Plan Comments:          Anesthesia Quick Evaluation

## 2022-04-03 ENCOUNTER — Encounter (HOSPITAL_COMMUNITY)
Admission: AD | Disposition: A | Discharge status: Home / Self Care | Payer: Self-pay | Source: Home / Self Care | Attending: Obstetrics and Gynecology

## 2022-04-03 ENCOUNTER — Other Ambulatory Visit: Payer: Self-pay

## 2022-04-03 ENCOUNTER — Encounter (HOSPITAL_COMMUNITY): Payer: Self-pay | Admitting: Obstetrics and Gynecology

## 2022-04-03 ENCOUNTER — Inpatient Hospital Stay (HOSPITAL_COMMUNITY): Payer: Medicaid Other | Admitting: Anesthesiology

## 2022-04-03 DIAGNOSIS — O164 Unspecified maternal hypertension, complicating childbirth: Secondary | ICD-10-CM

## 2022-04-03 DIAGNOSIS — O9952 Diseases of the respiratory system complicating childbirth: Secondary | ICD-10-CM

## 2022-04-03 DIAGNOSIS — O365930 Maternal care for other known or suspected poor fetal growth, third trimester, not applicable or unspecified: Secondary | ICD-10-CM

## 2022-04-03 DIAGNOSIS — O09523 Supervision of elderly multigravida, third trimester: Secondary | ICD-10-CM

## 2022-04-03 DIAGNOSIS — Z3A28 28 weeks gestation of pregnancy: Secondary | ICD-10-CM

## 2022-04-03 DIAGNOSIS — J45909 Unspecified asthma, uncomplicated: Secondary | ICD-10-CM

## 2022-04-03 DIAGNOSIS — O99344 Other mental disorders complicating childbirth: Secondary | ICD-10-CM

## 2022-04-03 DIAGNOSIS — O1002 Pre-existing essential hypertension complicating childbirth: Secondary | ICD-10-CM

## 2022-04-03 DIAGNOSIS — O2412 Pre-existing diabetes mellitus, type 2, in childbirth: Secondary | ICD-10-CM

## 2022-04-03 DIAGNOSIS — Z302 Encounter for sterilization: Secondary | ICD-10-CM

## 2022-04-03 DIAGNOSIS — Z3A29 29 weeks gestation of pregnancy: Secondary | ICD-10-CM

## 2022-04-03 DIAGNOSIS — Z98891 History of uterine scar from previous surgery: Secondary | ICD-10-CM

## 2022-04-03 DIAGNOSIS — O34211 Maternal care for low transverse scar from previous cesarean delivery: Secondary | ICD-10-CM

## 2022-04-03 DIAGNOSIS — Z9851 Tubal ligation status: Secondary | ICD-10-CM

## 2022-04-03 DIAGNOSIS — O99334 Smoking (tobacco) complicating childbirth: Secondary | ICD-10-CM

## 2022-04-03 HISTORY — PX: CERVICAL CERCLAGE: SHX1329

## 2022-04-03 LAB — CBC
HCT: 37.5 % (ref 36.0–46.0)
Hemoglobin: 13.1 g/dL (ref 12.0–15.0)
MCH: 31.8 pg (ref 26.0–34.0)
MCHC: 34.9 g/dL (ref 30.0–36.0)
MCV: 91 fL (ref 80.0–100.0)
Platelets: 184 10*3/uL (ref 150–400)
RBC: 4.12 MIL/uL (ref 3.87–5.11)
RDW: 13.6 % (ref 11.5–15.5)
WBC: 16 10*3/uL — ABNORMAL HIGH (ref 4.0–10.5)
nRBC: 0 % (ref 0.0–0.2)

## 2022-04-03 LAB — COMPREHENSIVE METABOLIC PANEL
ALT: 14 U/L (ref 0–44)
AST: 14 U/L — ABNORMAL LOW (ref 15–41)
Albumin: 2.7 g/dL — ABNORMAL LOW (ref 3.5–5.0)
Alkaline Phosphatase: 61 U/L (ref 38–126)
Anion gap: 9 (ref 5–15)
BUN: 5 mg/dL — ABNORMAL LOW (ref 6–20)
CO2: 17 mmol/L — ABNORMAL LOW (ref 22–32)
Calcium: 7.8 mg/dL — ABNORMAL LOW (ref 8.9–10.3)
Chloride: 107 mmol/L (ref 98–111)
Creatinine, Ser: 0.33 mg/dL — ABNORMAL LOW (ref 0.44–1.00)
GFR, Estimated: >60 mL/min (ref >60)
Glucose, Bld: 143 mg/dL — ABNORMAL HIGH (ref 70–99)
Potassium: 3.6 mmol/L (ref 3.5–5.1)
Sodium: 133 mmol/L — ABNORMAL LOW (ref 135–145)
Total Bilirubin: 0.4 mg/dL (ref 0.3–1.2)
Total Protein: 5.7 g/dL — ABNORMAL LOW (ref 6.5–8.1)

## 2022-04-03 LAB — SYPHILIS: RPR W/REFLEX TO RPR TITER AND TREPONEMAL ANTIBODIES, TRADITIONAL SCREENING AND DIAGNOSIS ALGORITHM: RPR Ser Ql: NONREACTIVE

## 2022-04-03 LAB — GLUCOSE, CAPILLARY
Glucose-Capillary: 151 mg/dL — ABNORMAL HIGH (ref 70–99)
Glucose-Capillary: 277 mg/dL — ABNORMAL HIGH (ref 70–99)

## 2022-04-03 SURGERY — Surgical Case
Anesthesia: Spinal | Site: Vagina

## 2022-04-03 MED ORDER — TRANEXAMIC ACID-NACL 1000-0.7 MG/100ML-% IV SOLN
INTRAVENOUS | Status: AC
  Filled 2022-04-03: qty 100

## 2022-04-03 MED ORDER — METFORMIN HCL 500 MG PO TABS
1000.0000 mg | ORAL_TABLET | Freq: Two times a day (BID) | ORAL | Status: DC
  Administered 2022-04-03 – 2022-04-06 (×6): 1000 mg via ORAL
  Filled 2022-04-03 (×6): qty 2

## 2022-04-03 MED ORDER — COCONUT OIL OIL
1.0000 | TOPICAL_OIL | Status: DC | PRN
  Administered 2022-04-05: 1 via TOPICAL

## 2022-04-03 MED ORDER — SIMETHICONE 80 MG PO CHEW: 80.0000 mg | CHEWABLE_TABLET | ORAL | Status: DC | PRN

## 2022-04-03 MED ORDER — OXYTOCIN-SODIUM CHLORIDE 30-0.9 UT/500ML-% IV SOLN
INTRAVENOUS | Status: DC | PRN
  Administered 2022-04-03: 300 mL via INTRAVENOUS
  Administered 2022-04-03: 200 mL via INTRAVENOUS

## 2022-04-03 MED ORDER — MENTHOL 3 MG MT LOZG: 1.0000 | LOZENGE | OROMUCOSAL | Status: DC | PRN

## 2022-04-03 MED ORDER — TETANUS-DIPHTH-ACELL PERTUSSIS 5-2.5-18.5 LF-MCG/0.5 IM SUSY: 0.5000 mL | PREFILLED_SYRINGE | Freq: Once | INTRAMUSCULAR | Status: DC

## 2022-04-03 MED ORDER — WITCH HAZEL-GLYCERIN EX PADS: 1.0000 | MEDICATED_PAD | CUTANEOUS | Status: DC | PRN

## 2022-04-03 MED ORDER — ONDANSETRON HCL 4 MG/2ML IJ SOLN: 4.0000 mg | Freq: Three times a day (TID) | INTRAMUSCULAR | Status: DC | PRN

## 2022-04-03 MED ORDER — KETOROLAC TROMETHAMINE 30 MG/ML IJ SOLN: 30.0000 mg | Freq: Four times a day (QID) | INTRAMUSCULAR | Status: DC | PRN

## 2022-04-03 MED ORDER — PRENATAL MULTIVITAMIN CH
1.0000 | ORAL_TABLET | Freq: Every day | ORAL | Status: DC
  Administered 2022-04-03 – 2022-04-05 (×3): 1 via ORAL
  Filled 2022-04-03 (×3): qty 1

## 2022-04-03 MED ORDER — NALOXONE HCL 0.4 MG/ML IJ SOLN: 0.4000 mg | INTRAMUSCULAR | Status: DC | PRN

## 2022-04-03 MED ORDER — PHENYLEPHRINE HCL-NACL 20-0.9 MG/250ML-% IV SOLN
INTRAVENOUS | Status: DC | PRN
  Administered 2022-04-03: 60 ug/min via INTRAVENOUS
  Administered 2022-04-03: 90 ug/min via INTRAVENOUS

## 2022-04-03 MED ORDER — FENTANYL CITRATE (PF) 100 MCG/2ML IJ SOLN
INTRAMUSCULAR | Status: AC
  Filled 2022-04-03: qty 2

## 2022-04-03 MED ORDER — ONDANSETRON HCL 4 MG/2ML IJ SOLN
INTRAMUSCULAR | Status: DC | PRN
  Administered 2022-04-03: 4 mg via INTRAVENOUS

## 2022-04-03 MED ORDER — OXYTOCIN-SODIUM CHLORIDE 30-0.9 UT/500ML-% IV SOLN
INTRAVENOUS | Status: AC
  Filled 2022-04-03: qty 500

## 2022-04-03 MED ORDER — SOD CITRATE-CITRIC ACID 500-334 MG/5ML PO SOLN
30.0000 mL | Freq: Once | ORAL | Status: AC
  Administered 2022-04-03: 30 mL via ORAL

## 2022-04-03 MED ORDER — MORPHINE SULFATE (PF) 0.5 MG/ML IJ SOLN
INTRAMUSCULAR | Status: DC | PRN
  Administered 2022-04-03: .15 mg via INTRATHECAL

## 2022-04-03 MED ORDER — ZOLPIDEM TARTRATE 5 MG PO TABS: 5.0000 mg | ORAL_TABLET | Freq: Every evening | ORAL | Status: DC | PRN

## 2022-04-03 MED ORDER — OXYCODONE HCL 5 MG PO TABS
5.0000 mg | ORAL_TABLET | ORAL | Status: DC | PRN
  Administered 2022-04-05 (×3): 5 mg via ORAL
  Administered 2022-04-06: 10 mg via ORAL
  Filled 2022-04-03 (×3): qty 1
  Filled 2022-04-03: qty 2
  Filled 2022-04-03: qty 1

## 2022-04-03 MED ORDER — ACETAMINOPHEN 10 MG/ML IV SOLN
INTRAVENOUS | Status: DC | PRN
  Administered 2022-04-03: 1000 mg via INTRAVENOUS

## 2022-04-03 MED ORDER — GABAPENTIN 100 MG PO CAPS
100.0000 mg | ORAL_CAPSULE | Freq: Two times a day (BID) | ORAL | Status: DC
  Administered 2022-04-03 – 2022-04-05 (×5): 100 mg via ORAL
  Filled 2022-04-03 (×6): qty 1

## 2022-04-03 MED ORDER — DIPHENHYDRAMINE HCL 25 MG PO CAPS: 25.0000 mg | ORAL_CAPSULE | Freq: Four times a day (QID) | ORAL | Status: DC | PRN

## 2022-04-03 MED ORDER — MEPERIDINE HCL 25 MG/ML IJ SOLN: 6.2500 mg | INTRAMUSCULAR | Status: DC | PRN

## 2022-04-03 MED ORDER — SOD CITRATE-CITRIC ACID 500-334 MG/5ML PO SOLN
ORAL | Status: AC
  Filled 2022-04-03: qty 30

## 2022-04-03 MED ORDER — TRANEXAMIC ACID-NACL 1000-0.7 MG/100ML-% IV SOLN
INTRAVENOUS | Status: DC | PRN
  Administered 2022-04-03: 1000 mg via INTRAVENOUS

## 2022-04-03 MED ORDER — IBUPROFEN 600 MG PO TABS
600.0000 mg | ORAL_TABLET | Freq: Four times a day (QID) | ORAL | Status: DC
  Administered 2022-04-04: 600 mg via ORAL
  Filled 2022-04-03: qty 1

## 2022-04-03 MED ORDER — SODIUM CHLORIDE 0.9% FLUSH: 3.0000 mL | INTRAVENOUS | Status: DC | PRN

## 2022-04-03 MED ORDER — KETOROLAC TROMETHAMINE 30 MG/ML IJ SOLN
30.0000 mg | Freq: Four times a day (QID) | INTRAMUSCULAR | Status: DC
  Administered 2022-04-03 – 2022-04-04 (×3): 30 mg via INTRAVENOUS
  Filled 2022-04-03 (×3): qty 1

## 2022-04-03 MED ORDER — SIMETHICONE 80 MG PO CHEW
80.0000 mg | CHEWABLE_TABLET | Freq: Three times a day (TID) | ORAL | Status: DC
  Administered 2022-04-03 – 2022-04-06 (×8): 80 mg via ORAL
  Filled 2022-04-03 (×9): qty 1

## 2022-04-03 MED ORDER — FENTANYL CITRATE (PF) 100 MCG/2ML IJ SOLN
INTRAMUSCULAR | Status: DC | PRN
  Administered 2022-04-03: 15 ug via INTRATHECAL

## 2022-04-03 MED ORDER — ONDANSETRON HCL 4 MG/2ML IJ SOLN
INTRAMUSCULAR | Status: AC
  Filled 2022-04-03: qty 2

## 2022-04-03 MED ORDER — MORPHINE SULFATE (PF) 0.5 MG/ML IJ SOLN
INTRAMUSCULAR | Status: AC
  Filled 2022-04-03: qty 10

## 2022-04-03 MED ORDER — PHENYLEPHRINE HCL-NACL 20-0.9 MG/250ML-% IV SOLN
INTRAVENOUS | Status: AC
  Filled 2022-04-03: qty 250

## 2022-04-03 MED ORDER — OXYTOCIN-SODIUM CHLORIDE 30-0.9 UT/500ML-% IV SOLN: 2.5000 [IU]/h | INTRAVENOUS | Status: DC

## 2022-04-03 MED ORDER — FENTANYL CITRATE (PF) 100 MCG/2ML IJ SOLN: 25.0000 ug | INTRAMUSCULAR | Status: DC | PRN

## 2022-04-03 MED ORDER — ENOXAPARIN SODIUM 40 MG/0.4ML IJ SOSY
40.0000 mg | PREFILLED_SYRINGE | INTRAMUSCULAR | Status: DC
  Administered 2022-04-04 – 2022-04-06 (×3): 40 mg via SUBCUTANEOUS
  Filled 2022-04-03 (×3): qty 0.4

## 2022-04-03 MED ORDER — SENNOSIDES-DOCUSATE SODIUM 8.6-50 MG PO TABS
2.0000 | ORAL_TABLET | Freq: Every day | ORAL | Status: DC
  Administered 2022-04-03 – 2022-04-05 (×3): 2 via ORAL
  Filled 2022-04-03 (×3): qty 2

## 2022-04-03 MED ORDER — DIBUCAINE (PERIANAL) 1 % EX OINT: 1.0000 | TOPICAL_OINTMENT | CUTANEOUS | Status: DC | PRN

## 2022-04-03 MED ORDER — ACETAMINOPHEN 10 MG/ML IV SOLN
INTRAVENOUS | Status: AC
  Filled 2022-04-03: qty 100

## 2022-04-03 MED ORDER — ACETAMINOPHEN 500 MG PO TABS
1000.0000 mg | ORAL_TABLET | Freq: Four times a day (QID) | ORAL | Status: DC
  Administered 2022-04-03 – 2022-04-06 (×11): 1000 mg via ORAL
  Filled 2022-04-03 (×11): qty 2

## 2022-04-03 MED ORDER — BUPIVACAINE IN DEXTROSE 0.75-8.25 % IT SOLN
INTRATHECAL | Status: DC | PRN
  Administered 2022-04-03: 1.8 mg via INTRATHECAL

## 2022-04-03 SURGICAL SUPPLY — 33 items
APL PRP STRL LF DISP 70% ISPRP (MISCELLANEOUS) ×4
CHLORAPREP W/TINT 26 (MISCELLANEOUS) IMPLANT
CLOTH BEACON ORANGE TIMEOUT ST (SAFETY) IMPLANT
DRSG OPSITE POSTOP 4X10 (GAUZE/BANDAGES/DRESSINGS) IMPLANT
ELECT REM PT RETURN 9FT ADLT (ELECTROSURGICAL) ×2
ELECTRODE REM PT RTRN 9FT ADLT (ELECTROSURGICAL) IMPLANT
GAUZE SPONGE 4X4 12PLY STRL LF (GAUZE/BANDAGES/DRESSINGS) IMPLANT
GLOVE BIOGEL PI IND STRL 7.0 (GLOVE) IMPLANT
GLOVE ECLIPSE 7.0 STRL STRAW (GLOVE) IMPLANT
GOWN STRL REUS W/TWL LRG LVL3 (GOWN DISPOSABLE) IMPLANT
HIBICLENS CHG 4% 4OZ BTL (MISCELLANEOUS) IMPLANT
KIT ABG SYR 3ML LUER SLIP (SYRINGE) IMPLANT
LIGASURE IMPACT 36 18CM CVD LR (INSTRUMENTS) IMPLANT
NDL HYPO 25X5/8 SAFETYGLIDE (NEEDLE) IMPLANT
NEEDLE HYPO 25X5/8 SAFETYGLIDE (NEEDLE) ×2 IMPLANT
NS IRRIG 1000ML POUR BTL (IV SOLUTION) IMPLANT
PACK C SECTION WH (CUSTOM PROCEDURE TRAY) IMPLANT
PAD OB MATERNITY 4.3X12.25 (PERSONAL CARE ITEMS) IMPLANT
PENCIL BUTTON HOLSTER BLD 10FT (ELECTRODE) IMPLANT
RTRCTR C-SECT PINK 25CM LRG (MISCELLANEOUS) IMPLANT
SPONGE T-LAP 4X18 ~~LOC~~+RFID (SPONGE) IMPLANT
SUT MNCRL 0 VIOLET CTX 36 (SUTURE) IMPLANT
SUT MON AB 2-0 SH 27 (SUTURE) ×2
SUT MON AB 2-0 SH27 (SUTURE) IMPLANT
SUT MONOCRYL 0 CTX 36 (SUTURE) ×4
SUT PDS AB 0 CTX 36 PDP370T (SUTURE) IMPLANT
SUT PLAIN 2 0 (SUTURE) ×4
SUT PLAIN ABS 2-0 CT1 27XMFL (SUTURE) IMPLANT
SUT VIC AB 4-0 KS 27 (SUTURE) IMPLANT
SYR CONTROL 10ML LL (SYRINGE) IMPLANT
TOWEL OR 17X24 6PK STRL BLUE (TOWEL DISPOSABLE) IMPLANT
TRAY FOLEY W/BAG SLVR 14FR LF (SET/KITS/TRAYS/PACK) IMPLANT
WATER STERILE IRR 1000ML POUR (IV SOLUTION) IMPLANT

## 2022-04-03 NOTE — Progress Notes (Signed)
Faculty Practice OB/GYN Attending Note  Subjective:  Patient is doing well. No concerns.  No contractions, no LOF or vaginal bleeding. Good FM reported. A little worried about delivery today.  Patient Active Problem List   Diagnosis Date Noted   Fetal growth restriction antepartum 03/27/2022   Severe IUGR (intrauterine growth restriction) affecting care of mother, second trimester 03/02/2022   Cervical cerclage suture present 02/01/2022   Rubella non-immune status, antepartum 12/28/2021   Type 2 diabetes mellitus affecting pregnancy, antepartum 12/23/2021   Chronic hypertension affecting pregnancy 12/23/2021   History of preterm premature rupture of membranes (PPROM) at 6 weeks 12/15/2021   Supervision of high-risk pregnancy 12/15/2021   Advanced maternal age in multigravida 12/15/2021   Chronic bronchitis ([place]) 11/11/2021   Cervical radiculopathy 04/09/2021   History of trauma 03/21/2019   Major depressive disorder, recurrent episode, severe with anxious distress ([place]) 03/21/2019   Panic attacks 03/21/2019   Tobacco use disorder 05/27/2017   Hypertension    Diabetes mellitus without complication ([place])    Asthma affecting pregnancy in second trimester    GERD (gastroesophageal reflux disease)    Obesity 04/26/2013   Hodgkin's disease in remission ([place]) 10/03/2012      Objective:  Blood pressure (!) 140/72, pulse 68, temperature 98 F (36.7 C), temperature source Oral, resp. rate 18, height '5\' 5"'$  (1.651 m), weight 88.9 kg, last menstrual period 08/28/2021, SpO2 97 %. FHT  Baseline 140 bpm, minimal -moderate variability, no accelerations, no decelerations Toco: no contractions Gen: NAD HENT: Normocephalic, atraumatic Lungs: Normal respiratory effort Heart: Regular rate noted Abdomen: NT, obese, gravid fundus, soft Cervix: Deferred Ext: 2+ DTRs, no edema, no cyanosis, negative Homan's sign  Results for orders placed or performed during the hospital encounter of 04/02/22  (from the past 24 hour(s))  Type and screen [place]     Status: None   Collection Time: 04/02/22  4:42 PM  Result Value Ref Range   ABO/RH(D) A POS    Antibody Screen NEG    Sample Expiration      04/05/2022,2359 Performed at [facility], [place] [address] [place], [place] [address]   Comprehensive metabolic panel     Status: Abnormal   Collection Time: 04/02/22  4:46 PM  Result Value Ref Range   Sodium 133 (L) 135 - 145 mmol/L   Potassium 3.7 3.5 - 5.1 mmol/L   Chloride 106 98 - 111 mmol/L   CO2 17 (L) 22 - 32 mmol/L   Glucose, Bld 141 (H) 70 - 99 mg/dL   BUN 8 6 - 20 mg/dL   Creatinine, Ser 0.43 (L) 0.44 - 1.00 mg/dL   Calcium 9.7 8.9 - 10.3 mg/dL   Total Protein 6.0 (L) 6.5 - 8.1 g/dL   Albumin 2.8 (L) 3.5 - 5.0 g/dL   AST 16 15 - 41 U/L   ALT 13 0 - 44 U/L   Alkaline Phosphatase 71 38 - 126 U/L   Total Bilirubin 0.4 0.3 - 1.2 mg/dL   GFR, Estimated >60 >60 mL/min   Anion gap 10 5 - 15  CBC     Status: Abnormal   Collection Time: 04/02/22  4:46 PM  Result Value Ref Range   WBC 18.7 (H) 4.0 - 10.5 K/uL   RBC 4.34 3.87 - 5.11 MIL/uL   Hemoglobin 13.4 12.0 - 15.0 g/dL   HCT 39.9 36.0 - 46.0 %   MCV 91.9 80.0 - 100.0 fL   MCH 30.9 26.0 -  34.0 pg   MCHC 33.6 30.0 - 36.0 g/dL   RDW 13.6 11.5 - 15.5 %   Platelets 193 150 - 400 K/uL   nRBC 0.0 0.0 - 0.2 %  CBC     Status: Abnormal   Collection Time: 04/03/22  4:16 AM  Result Value Ref Range   WBC 16.0 (H) 4.0 - 10.5 K/uL   RBC 4.12 3.87 - 5.11 MIL/uL   Hemoglobin 13.1 12.0 - 15.0 g/dL   HCT 37.5 36.0 - 46.0 %   MCV 91.0 80.0 - 100.0 fL   MCH 31.8 26.0 - 34.0 pg   MCHC 34.9 30.0 - 36.0 g/dL   RDW 13.6 11.5 - 15.5 %   Platelets 184 150 - 400 K/uL   nRBC 0.0 0.0 - 0.2 %  Comprehensive metabolic panel     Status: Abnormal   Collection Time: 04/03/22  4:16 AM  Result Value Ref Range   Sodium 133 (L) 135 - 145 mmol/L   Potassium 3.6 3.5 - 5.1 mmol/L   Chloride 107 98 - 111 mmol/L   CO2 17  (L) 22 - 32 mmol/L   Glucose, Bld 143 (H) 70 - 99 mg/dL   BUN 5 (L) 6 - 20 mg/dL   Creatinine, Ser 0.33 (L) 0.44 - 1.00 mg/dL   Calcium 7.8 (L) 8.9 - 10.3 mg/dL   Total Protein 5.7 (L) 6.5 - 8.1 g/dL   Albumin 2.7 (L) 3.5 - 5.0 g/dL   AST 14 (L) 15 - 41 U/L   ALT 14 0 - 44 U/L   Alkaline Phosphatase 61 38 - 126 U/L   Total Bilirubin 0.4 0.3 - 1.2 mg/dL   GFR, Estimated >60 >60 mL/min   Anion gap 9 5 - 15   Assessment & Plan:  42 y.o. G3P0110 at 41w0dadmitted for surveillance of severely growth restricted fetus (04/02/22 EFW <1% 546 grams) with reversed end diastolic flow.  MFM advised continuous monitoring with magnesium sulfate neuroprotection with delivery by cesarean section planned for 04/03/22 morning. She had reassuring FHR tracing overnight.    Patient and FHR will hopefully continue to remain reassuring until her scheduled cesarean section, bilateral tubal sterilization and cerclage removal at 0930 today.  Preop orders already signed and held. All questions answered.   Signed consent in patient's chart.  Patient has been NPO after midnight. Anesthesiology, Neonatology and OR teams are aware of patient.  Preoperative prophylactic antibiotics, TXA and SCDs ordered on call to the OR.    Continue close observation.   UVerita Schneiders MD, [place]for [facility] [place]

## 2022-04-03 NOTE — Anesthesia Procedure Notes (Signed)
Spinal  Patient location during procedure: OR Start time: 04/03/2022 9:52 AM End time: 04/03/2022 9:55 AM Reason for block: surgical anesthesia Staffing Performed: anesthesiologist  Anesthesiologist: Josephine Igo, MD Performed by: Josephine Igo, MD Authorized by: Josephine Igo, MD   Preanesthetic Checklist Completed: patient identified, IV checked, site marked, risks and benefits discussed, surgical consent, monitors and equipment checked, pre-op evaluation and timeout performed Spinal Block Patient position: sitting Prep: DuraPrep and site prepped and draped Patient monitoring: heart rate, cardiac monitor, continuous pulse ox and blood pressure Approach: midline Location: L3-4 Injection technique: single-shot Needle Needle type: Pencan  Needle gauge: 24 G Needle length: 9 cm Needle insertion depth: 7 cm Assessment Sensory level: T4 Events: CSF return Additional Notes Patient tolerated procedure well. Adequate sensory level.

## 2022-04-03 NOTE — Discharge Summary (Signed)
Postpartum Discharge Summary  Date of Service updated***     Patient Name: Bridget Scott DOB: 1980-10-27 MRN: [id number]  Date of admission: 04/02/2022 Delivery date:04/03/2022  Delivering provider: Valeda Malm  Date of discharge: 04/03/2022  Admitting diagnosis: IUGR (intrauterine growth restriction) affecting care of mother, third trimester, fetus 1 [O36.5931] Intrauterine pregnancy: [redacted]w[redacted]d    Secondary diagnosis:  Active Problems:   Asthma affecting pregnancy in second trimester   Chronic hypertension affecting pregnancy   Rubella non-immune status, antepartum   S/P cesarean section   S/P tubal ligation  Additional problems: ***    Discharge diagnosis: {DX.:23714}                                              Post partum procedures:{Postpartum procedures:23558} Augmentation: {123456Complications: {OB Labor/Delivery Complications:20784}  Hospital course: {Courses:23701}  Magnesium Sulfate received: {Mag received:30440022} BMZ received: {BMZ received:30440023} Rhophylac:{Rhophylac received:30440032} MMR:{MMR:30440033} T-DaP:{Tdap:23962} Flu: {WG:1132360Transfusion:{Transfusion received:30440034}  Physical exam  Vitals:   04/03/22 0500 04/03/22 0600 04/03/22 0700 04/03/22 0742  BP:    126/69  Pulse:    68  Resp: '18 16 18 16  '$ Temp:      TempSrc:      SpO2:      Weight:      Height:       General: {Exam; general:21111117} Lochia: {Desc; appropriate/inappropriate:30686::"appropriate"} Uterine Fundus: {Desc; firm/soft:30687} Incision: {Exam; incision:21111123} DVT Evaluation: {Exam; dvt:2111122} Labs: Lab Results  Component Value Date   WBC 16.0 (H) 04/03/2022   HGB 13.1 04/03/2022   HCT 37.5 04/03/2022   MCV 91.0 04/03/2022   PLT 184 04/03/2022      Latest Ref Rng & Units 04/03/2022    4:16 AM  CMP  Glucose 70 - 99 mg/dL 143   BUN 6 - 20 mg/dL 5   Creatinine 0.44 - 1.00 mg/dL 0.33   Sodium 135 - 145 mmol/L 133   Potassium 3.5 - 5.1  mmol/L 3.6   Chloride 98 - 111 mmol/L 107   CO2 22 - 32 mmol/L 17   Calcium 8.9 - 10.3 mg/dL 7.8   Total Protein 6.5 - 8.1 g/dL 5.7   Total Bilirubin 0.3 - 1.2 mg/dL 0.4   Alkaline Phos 38 - 126 U/L 61   AST 15 - 41 U/L 14   ALT 0 - 44 U/L 14    Edinburgh Score:     No data to display           After visit meds:  Allergies as of 04/03/2022       Reactions   Robitussin Dm Max Day-night Hives, Itching   Other reaction(s): Dizziness, Flushing, Vomiting   Analgesic Balm [trolamine (triethanolamine)] Rash   Bengay Pain Relief [menthol] Rash   Guaifenesin Hives, Rash, Other (See Comments), Nausea And Vomiting   "Made me sick" (Robitussin)   Latex Rash     Med Rec must be completed prior to using this [place]**        Discharge home in stable condition Infant Feeding: {Baby feeding:23562} Infant Disposition:{CHL IP OB HOME WITH MDX:[id number] instruction: per After Visit Summary and Postpartum booklet. Activity: Advance as tolerated. Pelvic rest for 6 weeks.  Diet: {OB dBY:630183Future Appointments: Future Appointments  Date Time Provider [place] 04/09/2022  3:00 PM TBerniece Salines DO [facility] None  04/14/2022  3:30 PM DRadene Gunning  MD [facility]   Follow up Visit:   Please schedule this patient for a {Visit type:23955} postpartum visit in {Postpartum visit:23953} with the following provider: {Provider type:23954}. Additional Postpartum F/U:{PP Procedure:23957}  {Risk Q000111Q pregnancy complicated by: 0000000 Delivery mode:  C-Section, Low Transverse  Anticipated Birth Control:  {Birth Control:23956}   04/03/2022 Simone Autry-Lott, DO

## 2022-04-03 NOTE — Op Note (Addendum)
Cesarean Delivery Operative Note Preoperative Diagnosis:  IUP @ [redacted]w[redacted]d Severe fetal growth restriction with reversed end diastolic flow, Undesired Fertility  Postoperative Diagnosis:  Same  Procedure: Classical cesarean section, Bilateral Salpingectomy, Cerclage removal  Surgeon: BValeda Malm DO  Assistant: SMelvenia Needles DO An experienced assistant was required given the standard of surgical care given the complexity of the case.  This assistant was needed for exposure, dissection, suctioning, retraction, instrument exchange, assisting with delivery with administration of fundal pressure, and for overall help during the procedure.  Anesthesia: spinal  Findings: Viable preterm infant, APGAR (1 MIN):   APGAR (5 MINS): pending  APGAR (10 MINS):  pending Baby weight pending, Normal tubes and ovaries  Estimated blood loss: 650 cc  IV Fluids: [id number]  Complications: None   Specimens: Placenta and tubes to pathology   Reason for procedure: Briefly, the patient is a 42y.o. GNS:[id number] 247w0dith h/o previous C-section who desires permanent sterility regardless of fetal outcome.  Patient counseled, r.e. Risks benefits of BTL, including permanency of procedure, risk of failure, increased risk of ectopic.  Patient verbalized understanding and desires to proceed  Procedure: Patient is taken to the OR where spinal analgesia was administered. She was then placed in a supine position with left lateral tilt. She received 2 g of Ancef and SCDs were in place. A Foley catheter was already in the bladder. She was prepped and draped in the usual sterile fashion. A timeout was performed. A knife was then used to make a Pfannenstiel incision. This incision was carried out to underlying fascia which was divided in the midline with the knife. The incision was extended laterally, bluntly. The fascia was dissected of the underlying rectus superiorly.  The rectus was divided in the midline.  The peritoneal  cavity was entered bluntly.  Alexis retractor was placed inside the incision.  A knife was used to make the classical hysterotomy. This incision was carried down to the amniotic cavity. The fetus was delivered with the amniotic membrane intact to aid in atraumatic delivery. The amniotic sac was then ruptured and the fetus was observed to provide a weak crying with some movement.  Delayed cord clamping x 1 minute. Cord was clamped x 2 and cut. Infant taken to waiting NICU team.  Cord blood was obtained. Placenta was delivered from the uterus.  Uterus was cleaned with dry lap pads. Uterine incision closed with two layers of 0 Monocryl suture in a non-locked running fashion. The serosa was closed with 2-0 monocryl.   Attention was turned to the pt's right tube which was grasped with a Babcock clamp and followed to its fimbriated end. The tube was removed bipolar cautery taking care to avoid any contact with other structures in the surgical field. The same process was carried out on the contralateral tube to allow for bilateral salpingectomy. Alexis retractor was removed from the abdomen.  Fascia was closed with 0 PDS suture in a running fashion. Subcutaneous closure was performed with 0 Plain suture.  Skin closed using 3-0 monocryl on a Keith needle.  Honeycomb dressing applied, followed by pressure dressing.  All instrument, needle and lap counts were correct x 2. The cervical cerclage was removed after the care without difficulty. Patient was awake and taken to PACU stable.  Infant to Newborn Nursery, stable.

## 2022-04-03 NOTE — Anesthesia Postprocedure Evaluation (Signed)
Anesthesia Post Note  Patient: Bridget Scott  Procedure(s) Performed: CESAREAN SECTION WITH BILATERAL TUBAL LIGATION     Patient location during evaluation: PACU Anesthesia Type: Spinal Level of consciousness: oriented and awake and alert Pain management: pain level controlled Vital Signs Assessment: post-procedure vital signs reviewed and stable Respiratory status: spontaneous breathing, respiratory function stable and nonlabored ventilation Cardiovascular status: blood pressure returned to baseline and stable Postop Assessment: no headache, no backache, no apparent nausea or vomiting, patient able to bend at knees and spinal receding Anesthetic complications: no   No notable events documented.  Last Vitals:  Vitals:   04/03/22 1230 04/03/22 1245  BP: 104/66 108/72  Pulse: 67 64  Resp: (!) 22 18  Temp:  (!) 36.3 C  SpO2: 98% 99%    Last Pain:  Vitals:   04/03/22 1245  TempSrc: Oral  PainSc: 0-No pain   Pain Goal:    LLE Motor Response: Purposeful movement (04/03/22 1245) LLE Sensation: Tingling (04/03/22 1245) RLE Motor Response: Purposeful movement (04/03/22 1245) RLE Sensation: Tingling (04/03/22 1245) L Sensory Level: L3-Anterior knee, lower leg (04/03/22 1245) R Sensory Level: L3-Anterior knee, lower leg (04/03/22 1245) Epidural/Spinal Function Cutaneous sensation: Able to Discern Pressure (04/03/22 1245), Patient able to flex knees: Yes (04/03/22 1245), Patient able to lift hips off bed: Yes (04/03/22 1245), Back pain beyond tenderness at insertion site: No (04/03/22 1245), Progressively worsening motor and/or sensory loss: No (04/03/22 1245), Bowel and/or bladder incontinence post epidural: No (04/03/22 1245)  [name] A.

## 2022-04-03 NOTE — Transfer of Care (Signed)
Immediate Anesthesia Transfer of Care Note  Patient: Bridget Scott  Procedure(s) Performed: CESAREAN SECTION WITH BILATERAL TUBAL LIGATION  Patient Location: PACU  Anesthesia Type:Spinal  Level of Consciousness: awake  Airway & Oxygen Therapy: Patient Spontanous Breathing  Post-op Assessment: Report given to RN  Post vital signs: Reviewed and stable  Last Vitals:  Vitals Value Taken Time  BP 108/74 04/03/22 1152  Temp    Pulse 64 04/03/22 1153  Resp 30 04/03/22 1153  SpO2 100 % 04/03/22 1153  Vitals shown include unvalidated device data.  Last Pain:  Vitals:   04/03/22 0742  TempSrc:   PainSc: 0-No pain         Complications: No notable events documented.

## 2022-04-04 ENCOUNTER — Encounter (HOSPITAL_COMMUNITY): Payer: Self-pay | Admitting: Maternal & Fetal Medicine

## 2022-04-04 ENCOUNTER — Other Ambulatory Visit: Payer: Self-pay

## 2022-04-04 LAB — CBC
HCT: 30.8 % — ABNORMAL LOW (ref 36.0–46.0)
Hemoglobin: 10.5 g/dL — ABNORMAL LOW (ref 12.0–15.0)
MCH: 32.1 pg (ref 26.0–34.0)
MCHC: 34.1 g/dL (ref 30.0–36.0)
MCV: 94.2 fL (ref 80.0–100.0)
Platelets: 144 10*3/uL — ABNORMAL LOW (ref 150–400)
RBC: 3.27 MIL/uL — ABNORMAL LOW (ref 3.87–5.11)
RDW: 13.8 % (ref 11.5–15.5)
WBC: 13.2 10*3/uL — ABNORMAL HIGH (ref 4.0–10.5)
nRBC: 0 % (ref 0.0–0.2)

## 2022-04-04 LAB — GLUCOSE, CAPILLARY
Glucose-Capillary: 296 mg/dL — ABNORMAL HIGH (ref 70–99)
Glucose-Capillary: 110 mg/dL — ABNORMAL HIGH (ref 70–99)
Glucose-Capillary: 153 mg/dL — ABNORMAL HIGH (ref 70–99)
Glucose-Capillary: 75 mg/dL (ref 70–99)
Glucose-Capillary: 222 mg/dL — ABNORMAL HIGH (ref 70–99)

## 2022-04-04 MED ORDER — INSULIN ASPART 100 UNIT/ML IJ SOLN
0.0000 [IU] | Freq: Three times a day (TID) | INTRAMUSCULAR | Status: DC
  Administered 2022-04-04: 4 [IU] via SUBCUTANEOUS
  Administered 2022-04-04 – 2022-04-05 (×2): 11 [IU] via SUBCUTANEOUS
  Administered 2022-04-05: 3 [IU] via SUBCUTANEOUS

## 2022-04-04 MED ORDER — INSULIN ASPART 100 UNIT/ML IJ SOLN
0.0000 [IU] | Freq: Every day | INTRAMUSCULAR | Status: DC
  Administered 2022-04-04: 2 [IU] via SUBCUTANEOUS

## 2022-04-04 MED ORDER — INSULIN GLARGINE-YFGN 100 UNIT/ML ~~LOC~~ SOLN: 10.0000 [IU] | Freq: Every day | SUBCUTANEOUS | Status: DC

## 2022-04-04 MED ORDER — FUROSEMIDE 20 MG PO TABS
20.0000 mg | ORAL_TABLET | Freq: Every day | ORAL | Status: DC
  Administered 2022-04-04 – 2022-04-05 (×2): 20 mg via ORAL
  Filled 2022-04-04 (×2): qty 1

## 2022-04-04 MED ORDER — MEASLES, MUMPS & RUBELLA VAC IJ SOLR: 0.5000 mL | Freq: Once | INTRAMUSCULAR | Status: DC

## 2022-04-04 NOTE — Lactation Note (Addendum)
This note was copied from a baby's chart.  NICU Lactation Consultation Note  Patient Name: Bridget Scott [id number] Date: 04/04/2022 Age:41 hours  Reason for consult: Initial assessment; Primapara; 1st time breastfeeding; NICU baby; Preterm <34wks; Infant < 6lbs; Other (Comment) (AMA)   Subjective LC in to visit with P1 Mom of 29 wk infant delivered by C/Section and is in the NICU. Mom was originally planning for formula feed her baby, but over night, she changed her mind and asked to be set up with a pump.   Mom returned from visiting "[place]" and [facility] present to assist with pumping.  LC provided a hand's free pumping band (large brown).  24 mm flanges appear to be the right size.  Mom provided with coconut oil to lubricate her nipples during pumping.  Reviewed all the basics of milk production and importance of consistency of pumping in the first days to support a full milk supply.    Mom states she received a [name] hand's free pump from her insurance.  Talked about the importance of a hospital grade pump in establishing a full milk supply.  Mom aware of pump in baby's room that is her's to use.  Encouraged STS when able to. Encouraged breast massage and hand expression often Encouraged double pumping every 2-3 hrs when awake, using the initiation setting until volumes of 20+ml expressed.  Objective Infant data: Mother's Current Feeding Choice: Breast Milk and Donor Milk    Maternal data: [id number]  C-Section, Low Transverse Significant Breast History:: No breast changes in pregnancy  Current breast feeding challenges:: Infant separation, preterm baby  No data recorded Does the patient have breastfeeding experience prior to this delivery?: No  Pumping frequency: Started pumping at about 10 hrs post delivery Pumped volume: 0 mL Flange Size: 21  Risk factor for low milk supply:: Infant separation, infant born at 23 wks and in NICU  Has patient been taught Hand  Expression?: Yes  Hand Expression Comments: Reviewed this with Mom, unable to express a drop at present   Pump: Hands Free, Personal ([name])  Assessment Infant:  Feeding Status: NPO   Maternal: Milk volume: Normal   Intervention/Plan Interventions: Breast feeding basics reviewed; Skin to skin; Breast massage; Hand express; DEBP; Education; Coconut oil; LC Services brochure  Tools: Pump; Flanges; Coconut oil; Hands-free pumping top Pump Education: Setup, frequency, and cleaning; Milk Storage  Plan: Consult Status: NICU follow-up  NICU Follow-up type: New admission follow up     [name] 04/04/2022, 10:23 AM

## 2022-04-04 NOTE — Inpatient Diabetes Management (Signed)
Inpatient Diabetes Program Recommendations  AACE/ADA: New Consensus Statement on Inpatient Glycemic Control (2015)  Target Ranges:  Prepandial:   less than 140 mg/dL      Peak postprandial:   less than 180 mg/dL (1-2 hours)      Critically ill patients:  140 - 180 mg/dL   Lab Results  Component Value Date   GLUCAP 296 (H) 04/04/2022   HGBA1C 7.5 (H) 02/01/2022    Review of Glycemic Control  Diabetes history: DM2 with HgbA1C 7.5% Outpatient Diabetes medications: Lantus 13 units QHS, metformin 1000 mg BID Current orders for Inpatient glycemic control: Semglee 10 units QHS, metformin 1000 mg BID, Novolog 0-20 units TID with meals and 0-5 HS  Delivery 3/9/243  Inpatient Diabetes Program Recommendations:    Novolog 0-15 TID with meals and 0-5 HS Metformin 1000 mg BID Semglee 10 units QD (if FBS > 180 mg/dL)  Attempted to call pt in hospital room at [phone], with no answer.  For discharge:  Metformin 1000 mg BID  Monitor blood sugars  at least 3x/day and f/u with PCP and take logbook to appt for review. May need small amount of basal insulin if FBS consistently > 180 mg/dL. Will need updated HgbA1C at PCP appt.  Will recheck lunchtime CBG today F/U in am.  Thank you. Lorenda Peck, RD, LDN, [place] Inpatient Diabetes Coordinator [phone]

## 2022-04-04 NOTE — Plan of Care (Signed)
Problem: Education: Goal: Knowledge of General Education information will improve Description: Including pain rating scale, medication(s)/side effects and non-pharmacologic comfort measures Outcome: Progressing   Problem: Health Behavior/Discharge Planning: Goal: Ability to manage health-related needs will improve Outcome: Progressing   Problem: Clinical Measurements: Goal: Ability to maintain clinical measurements within normal limits will improve Outcome: Progressing Goal: Will remain free from infection Outcome: Progressing Goal: Diagnostic test results will improve Outcome: Progressing Goal: Respiratory complications will improve Outcome: Progressing Goal: Cardiovascular complication will be avoided Outcome: Progressing   Problem: Activity: Goal: Risk for activity intolerance will decrease Outcome: Progressing   Problem: Nutrition: Goal: Adequate nutrition will be maintained Outcome: Progressing   Problem: Coping: Goal: Level of anxiety will decrease Outcome: Progressing   Problem: Elimination: Goal: Will not experience complications related to bowel motility Outcome: Progressing Goal: Will not experience complications related to urinary retention Outcome: Progressing   Problem: Pain Managment: Goal: General experience of comfort will improve Outcome: Progressing   Problem: Safety: Goal: Ability to remain free from injury will improve Outcome: Progressing   Problem: Skin Integrity: Goal: Risk for impaired skin integrity will decrease Outcome: Progressing   Problem: Education: Goal: Knowledge of disease or condition will improve Outcome: Progressing Goal: Knowledge of the prescribed therapeutic regimen will improve Outcome: Progressing   Problem: Fluid Volume: Goal: Peripheral tissue perfusion will improve Outcome: Progressing   Problem: Clinical Measurements: Goal: Complications related to disease process, condition or treatment will be avoided or  minimized Outcome: Progressing   Problem: Education: Goal: Ability to describe self-care measures that may prevent or decrease complications (Diabetes Survival Skills Education) will improve Outcome: Progressing Goal: Individualized Educational Video(s) Outcome: Progressing   Problem: Coping: Goal: Ability to adjust to condition or change in health will improve Outcome: Progressing   Problem: Fluid Volume: Goal: Ability to maintain a balanced intake and output will improve Outcome: Progressing   Problem: Health Behavior/Discharge Planning: Goal: Ability to identify and utilize available resources and services will improve Outcome: Progressing Goal: Ability to manage health-related needs will improve Outcome: Progressing   Problem: Metabolic: Goal: Ability to maintain appropriate glucose levels will improve Outcome: Progressing   Problem: Nutritional: Goal: Maintenance of adequate nutrition will improve Outcome: Progressing Goal: Progress toward achieving an optimal weight will improve Outcome: Progressing   Problem: Skin Integrity: Goal: Risk for impaired skin integrity will decrease Outcome: Progressing   Problem: Tissue Perfusion: Goal: Adequacy of tissue perfusion will improve Outcome: Progressing   Problem: Education: Goal: Knowledge of the prescribed therapeutic regimen will improve Outcome: Progressing Goal: Understanding of sexual limitations or changes related to disease process or condition will improve Outcome: Progressing Goal: Individualized Educational Video(s) Outcome: Progressing   Problem: Self-Concept: Goal: Communication of feelings regarding changes in body function or appearance will improve Outcome: Progressing   Problem: Skin Integrity: Goal: Demonstration of wound healing without infection will improve Outcome: Progressing   Problem: Education: Goal: Knowledge of condition will improve Outcome: Progressing Goal: Individualized  Educational Video(s) Outcome: Progressing Goal: Individualized Newborn Educational Video(s) Outcome: Progressing   Problem: Activity: Goal: Will verbalize the importance of balancing activity with adequate rest periods Outcome: Progressing Goal: Ability to tolerate increased activity will improve Outcome: Progressing   Problem: Coping: Goal: Ability to identify and utilize available resources and services will improve Outcome: Progressing   Problem: Life Cycle: Goal: Chance of risk for complications during the postpartum period will decrease Outcome: Progressing   Problem: Role Relationship: Goal: Ability to demonstrate positive interaction with newborn will improve Outcome: Progressing  Problem: Skin Integrity: Goal: Demonstration of wound healing without infection will improve Outcome: Progressing   Problem: Education: Goal: Knowledge of condition will improve Outcome: Progressing Goal: Individualized Educational Video(s) Outcome: Progressing Goal: Individualized Newborn Educational Video(s) Outcome: Progressing

## 2022-04-04 NOTE — Progress Notes (Signed)
Post Partum Day 1 42yo [id number] s/p POD1 s/p primary CLASSICAL CS, bilateral salpingectomy & cerclage removal for FGR <1% w/ rEDF  Subjective: no complaints, up ad lib, voiding, tolerating PO, and + flatus  Objective: Blood pressure 119/66, pulse 79, temperature 97.9 F (36.6 C), temperature source Oral, resp. rate 18, height '5\' 5"'$  (1.651 m), weight 88.9 kg, last menstrual period 08/28/2021, SpO2 100 %, unknown if currently breastfeeding.  Physical Exam:  General: alert, cooperative, and no distress Lochia: appropriate Uterine Fundus: firm Incision: honeycomb dressing in place, clean/dry/intact DVT Evaluation: No evidence of DVT seen on physical exam.  Recent Labs    04/03/22 0416 04/04/22 0433  HGB 13.1 10.5*  HCT 37.5 30.8*    Assessment/Plan: Postpartum - Contraception: s/p bilateral salpingectomy - MOF: breast/pumping - Rh status: Rh+ - Rubella status: RNI, MMR ordered - Dispo: anticipate discharge home POD2-3 - Consults: NICU  2. cHTN - normotensive since delivery - low threshold to restart antihypertensives - lasix '20mg'$  daily x 5d PP  3. T2DM - diabetes coordinator following - metformin '1000mg'$  BID, SSI for correction - will add semglee if BG persistently > 180 throughout the day today/tomorrow AM  4. Neonatal - Doing well in NICU per patient - Circumcision: not addressed today   LOS: 2 days   Bridget Scott 04/04/2022, 12:09 PM

## 2022-04-05 LAB — GLUCOSE, CAPILLARY
Glucose-Capillary: 166 mg/dL — ABNORMAL HIGH (ref 70–99)
Glucose-Capillary: 146 mg/dL — ABNORMAL HIGH (ref 70–99)
Glucose-Capillary: 125 mg/dL — ABNORMAL HIGH (ref 70–99)
Glucose-Capillary: 263 mg/dL — ABNORMAL HIGH (ref 70–99)
Glucose-Capillary: 105 mg/dL — ABNORMAL HIGH (ref 70–99)

## 2022-04-05 MED ORDER — FUROSEMIDE 20 MG PO TABS
20.0000 mg | ORAL_TABLET | Freq: Two times a day (BID) | ORAL | Status: DC
  Administered 2022-04-05 – 2022-04-06 (×2): 20 mg via ORAL
  Filled 2022-04-05 (×2): qty 1

## 2022-04-05 MED ORDER — LABETALOL HCL 5 MG/ML IV SOLN
40.0000 mg | INTRAVENOUS | Status: DC | PRN
  Administered 2022-04-05: 40 mg via INTRAVENOUS
  Filled 2022-04-05: qty 8

## 2022-04-05 MED ORDER — HYDRALAZINE HCL 20 MG/ML IJ SOLN: 10.0000 mg | INTRAMUSCULAR | Status: DC | PRN

## 2022-04-05 MED ORDER — NIFEDIPINE ER OSMOTIC RELEASE 30 MG PO TB24
30.0000 mg | ORAL_TABLET | Freq: Every day | ORAL | Status: DC
  Administered 2022-04-05: 30 mg via ORAL
  Filled 2022-04-05: qty 1

## 2022-04-05 MED ORDER — POLYSACCHARIDE IRON COMPLEX 150 MG PO CAPS
150.0000 mg | ORAL_CAPSULE | ORAL | Status: DC
  Administered 2022-04-05: 150 mg via ORAL
  Filled 2022-04-05: qty 1

## 2022-04-05 MED ORDER — NIFEDIPINE ER OSMOTIC RELEASE 60 MG PO TB24
60.0000 mg | ORAL_TABLET | Freq: Two times a day (BID) | ORAL | Status: DC
  Administered 2022-04-05 – 2022-04-06 (×2): 60 mg via ORAL
  Filled 2022-04-05 (×2): qty 1

## 2022-04-05 MED ORDER — LABETALOL HCL 5 MG/ML IV SOLN
20.0000 mg | INTRAVENOUS | Status: DC | PRN
  Administered 2022-04-05: 20 mg via INTRAVENOUS
  Filled 2022-04-05: qty 4

## 2022-04-05 MED ORDER — LABETALOL HCL 5 MG/ML IV SOLN: 80.0000 mg | INTRAVENOUS | Status: DC | PRN

## 2022-04-05 NOTE — Progress Notes (Signed)
Subjective: Postpartum Day 2: Cesarean Delivery Patient reports incisional pain.    Objective: Vital signs in last 24 hours: Temp:  [97.6 F (36.4 C)-98 F (36.7 C)] 97.6 F (36.4 C) (03/11 0843) Pulse Rate:  [67-83] 71 (03/11 0946) Resp:  [16-18] 16 (03/11 0843) BP: (130-177)/(79-95) 157/80 (03/11 0946) SpO2:  [98 %-100 %] 100 % (03/11 0843) Today's Vitals   04/05/22 0901 04/05/22 0917 04/05/22 0931 04/05/22 0946  BP: (!) 177/95 (!) 176/93 (!) 173/88 (!) 157/80  Pulse: 67 77 72 71  Resp:      Temp:      TempSrc:      SpO2:      Weight:      Height:      PainSc:       Body mass index is 32.62 kg/m.  Intake/Output Summary (Last 24 hours) at 04/05/2022 1407 Last data filed at 04/05/2022 1000 Gross per 24 hour  Intake 480 ml  Output 500 ml  Net -20 ml    Physical Exam:  General: alert, cooperative, appears older than stated age, and no distress Lochia: appropriate Uterine Fundus: firm Incision: healing well DVT Evaluation: No evidence of DVT seen on physical exam.  Recent Labs    04/03/22 0416 04/04/22 0433  HGB 13.1 10.5*  HCT 37.5 30.8*    Assessment/Plan: Status post Cesarean section.  CBGs are ok, on metformin and SSI BPs are going up--she is stressed due to baby status, has NEC, needed emergent surgery Increased Procardia to 60 mg bid and increased lasix to 20 mg bid Continue current care. Anticipate DC in am if BPs are improved + breastfeeding S/p BTL  Donnamae Jude, MD 04/05/2022, 2:06 PM

## 2022-04-05 NOTE — Inpatient Diabetes Management (Signed)
Inpatient Diabetes Program Recommendations  AACE/ADA: New Consensus Statement on Inpatient Glycemic Control (2015)  Target Ranges:  Prepandial:   less than 140 mg/dL      Peak postprandial:   less than 180 mg/dL (1-2 hours)      Critically ill patients:  140 - 180 mg/dL   Lab Results  Component Value Date   GLUCAP 125 (H) 04/05/2022   HGBA1C 7.5 (H) 02/01/2022    Review of Glycemic Control  Latest Reference Range & Units 04/04/22 17:13 04/04/22 19:41 04/04/22 23:07 04/05/22 08:11  Glucose-Capillary 70 - 99 mg/dL 153 (H) 75 222 (H) 125 (H)  (H): Data is abnormally high Diabetes history: Type 2 DM  Outpatient Diabetes medications: Lantus 22 units QHS, Metformin 1000 mg BID,  Prior to pregnancy: Ozempic Qwk, Metformin 500 mg TID Current orders for Inpatient glycemic control: Novolog 0-20 units TID, Novolog 0-5 units QHS, Metformin 1000 mg BID  Inpatient Diabetes Program Recommendations:    Consider adding Semglee 10 units QD.   Thanks, Bronson Curb, MSN, RNC-OB Diabetes Coordinator [phone] (8a-5p)

## 2022-04-05 NOTE — Lactation Note (Signed)
This note was copied from a baby's chart.  NICU Lactation Consultation Note  Patient Name: Bridget Scott [id number] Date: 04/05/2022 Age:41 hours  Reason for consult: Follow-up assessment; 1st time breastfeeding; Primapara; NICU baby; Preterm <34wks; Other (Comment); Maternal endocrine disorder (AMA, SGA, IUGR)  Subjective Visited with family of 26 55/23 weeks old AGA NICU female; Bridget Scott is a P1 and reports she's been trying to pump every 3 hours but it's been challenging, she voiced nipple soreness, she hasn't been applying coconut oil. Request coconut oil to front desk and instructed her to use it prior pumping on the inside of her flange. She hasn't been able to get drops yet, reviewed normalcy patters and let her know that the purpose of pumping this early on is mainly for breast stimulation and not to get volume, she voiced understanding. Bridget Scott is expecting to be discharged tomorrow. Reviewed discharge education, engorgement prevention/treatment, pumping schedule, pump settings, lactogenesis II and anticipatory guidelines.  Objective Infant data: Mother's Current Feeding Choice: Breast Milk and Donor Milk  Maternal data: [id number]  C-Section, Low Transverse Significant Breast History:: No breast changes in pregnancy  Current breast feeding challenges:: Infant separation, preterm baby  Does the patient have breastfeeding experience prior to this delivery?: No  Pumping frequency: 4 times/24 hours Pumped volume: 0 mL Flange Size: 21; 24  Risk factor for low milk supply:: Infant separation, infant born at 83 wks and in NICU  Has patient been taught Hand Expression?: Yes  Hand Expression Comments: Reviewed this with Mom, unable to express a drop at present  Pump: Hands Free, Personal ([name])  Assessment Infant: Feeding Status: (S) Scheduled 9-12-3-6 (q3 feeding schedule per order)  Maternal: Milk volume: Normal  Intervention/Plan Interventions: Breast feeding  basics reviewed; DEBP; Education  Tools: Pump; Flanges; Coconut oil; Hands-free pumping top (Size "L") Pump Education: Setup, frequency, and cleaning; Milk Storage  Plan of care: Encouraged pumping every 3 hours, ideally 8 pumping sessions/24 hours Breast massage and coconut oil were also encouraged prior pumping She'll take all pump parts to baby's room after her discharge She'll switch her pump settings from initiation to expression mode once she starts getting 20 ml of EBM combined  Visitors present. All questions and concerns answered, family to contact [facility] services PRN.  Consult Status: NICU follow-up  NICU Follow-up type: Verify onset of copious milk; Verify absence of engorgement  Discharge Education: Engorgement and breast care   [place] 04/05/2022, 2:16 PM

## 2022-04-05 NOTE — Clinical Social Work Maternal (Signed)
CLINICAL SOCIAL WORK MATERNAL/CHILD NOTE  Patient Details  Name: Bridget Scott MRN: [id number] Date of Birth: [birth year]  Date:  04/05/2022  Clinical Social Worker Initiating Note:  Abundio Miu, [place] Date/Time: Initiated:  04/05/22/1510     Child's Name:  Dayton Martes   Biological Parents:  Mother, Father (Father: Shatia Krishna)   Need for Interpreter:  None   Reason for Referral:  Behavioral Health Concerns, Parental Support of Premature Babies < 67 weeks/or Critically Ill babies Flavia Shipper Score 12)   Address:  [address] [place] [address]    Phone number:  [phone] (home)     Additional phone number:   Household Members/Support Persons (HM/SP):   Household Member/Support Person 1, Household Member/Support Person 2, Household Member/Support Person 3, Household Member/Support Person 4   HM/SP Name Relationship DOB or Age  HM/SP -1 Alysa Chrysler FOB/Husband    HM/SP -2 Shateria Niebauer daughter 35 years old  HM/SP -59 [place] son 71 years old  HM/SP -[place] daughter 95 years old  HM/SP -5        HM/SP -6        HM/SP -7        HM/SP -8          Natural Supports (not living in the home):  Immediate Family   Professional Supports: None   Employment: Unemployed   Type of Work:     Education:  [place] arranged:    Museum/gallery curator Resources:  [employer]   Other Resources:  Arts development officer Considerations Which May Impact Care:    Strengths:  Ability to meet basic needs  , Understanding of illness, Psychotropic Medications, Home prepared for child     Psychotropic Medications:  Buspar      Pediatrician:       Pediatrician List:   [place]      Pediatrician Fax Number:    Risk Factors/Current Problems:  Mental Health Concerns     Cognitive State:  Alert  , Able to Concentrate  , Goal  Oriented  , Linear Thinking     Mood/Affect:  Calm  , Comfortable  , Interested     CSW Assessment: CSW met with MOB at bedside to complete psychosocial assessment, FOB and MOB's in-laws present. CSW introduced self and explained role. MOB's in-laws left the room. MOB was polite and remained engaged during assessment. MOB reported that she resides with FOB/Husband and three older children. MOB reported that she receives food stamps and is interested in [facility]. CSW asked MOB if she wanted CSW to complete a [facility] referral, MOB reported yes. CSW agreed to complete [facility] referral. Parents reported that they have started to shop for infant and denied needing any assistance with obtaining items for infant. Parents reported that they have a basinet and pack and play for infant. CSW inquired about MOB's support system aside from FOB, MOB reported that her in-laws, aunt, and uncle are supports.   CSW and parents discussed infant's NICU admission. CSW informed parents about the NICU, what to expect, and resources/supports available while infant is admitted to the NICU. MOB reported that they feel well informed about infant's care. Parents denied any transportation barriers with visiting infant in the NICU. Parents denied any questions/concerns regarding the NICU.   CSW informed parents  that infant qualifies to apply for SSI benefits, parents reported that they were interested. CSW informed parents about the application process and provided information on application process, parents denied any questions.   CSW asked to speak with MOB privately, FOB left the room.   CSW inquired about MOB's mental health history. MOB reported that she has mental health history and shared that she is doing fine. CSW inquired about the depression diagnosis noted on MOB's chart. MOB reported that she was diagnosed in 1999 and denied any current symptoms. MOB reported that she has taken medication and it was helpful. MOB verbalized a plan to  resume her psychotropic medication now that she has delivered. MOB reported that she is not participating in therapy. MOB denied any additional mental health history. MOB denied any history of postpartum depression. CSW inquired about how MOB has been feeling emotionally since giving birth, MOB reported that she has been emotional in relation to infant's NICU admission and shared that she is having ups and downs. CSW acknowledged, normalized, and validated MOB's feelings. CSW and MOB discussed emotions/feelings that may arise during a NICU admission. CSW emphasized the importance of MOB caring for herself during this time. CSW inquired about MOB's coping skills, MOB reported that her kids help her to cope. MOB shared about her older children. MOB presented calm and did not demonstrate any acute mental health signs/symptoms. CSW assessed for safety, MOB denied SI, HI, and domestic violence.   CSW provided education regarding the baby blues period vs. perinatal mood disorders, discussed treatment and gave resources for mental health follow up if concerns arise.  CSW recommends self-evaluation during the postpartum time period using the New Mom Checklist from Postpartum Progress and encouraged MOB to contact a medical professional if symptoms are noted at any time.    CSW will continue to offer resources/supports while infant is admitted to the NICU as MOB opted for CSW to check in weekly.   CSW completed [facility] referral.   CSW Plan/Description:  Perinatal Mood and Anxiety Disorder (PMADs) Education, Psychosocial Support and Ongoing Assessment of Needs, Supplemental Security Income (SSI) Information    Burnis Medin, LCSW 04/05/2022, 3:20 PM

## 2022-04-06 ENCOUNTER — Other Ambulatory Visit (HOSPITAL_COMMUNITY): Payer: Self-pay

## 2022-04-06 ENCOUNTER — Encounter (HOSPITAL_COMMUNITY): Payer: Self-pay | Admitting: Maternal & Fetal Medicine

## 2022-04-06 LAB — SURGICAL PATHOLOGY

## 2022-04-06 LAB — GLUCOSE, CAPILLARY: Glucose-Capillary: 127 mg/dL — ABNORMAL HIGH (ref 70–99)

## 2022-04-06 MED ORDER — IBUPROFEN 600 MG PO TABS
600.0000 mg | ORAL_TABLET | Freq: Four times a day (QID) | ORAL | 1 refills | Status: AC | PRN
  Filled 2022-04-06 – 2022-05-01 (×2): qty 30, 8d supply, fill #0

## 2022-04-06 MED ORDER — OXYCODONE HCL 5 MG PO TABS
5.0000 mg | ORAL_TABLET | ORAL | 0 refills | Status: AC | PRN
  Filled 2022-04-06: qty 30, 3d supply, fill #0

## 2022-04-06 MED ORDER — NIFEDIPINE ER 60 MG PO TB24
60.0000 mg | ORAL_TABLET | Freq: Two times a day (BID) | ORAL | 1 refills | Status: AC
  Filled 2022-04-06: qty 60, 30d supply, fill #0
  Filled 2022-05-03: qty 60, 30d supply, fill #1
  Filled 2022-05-11: qty 60, 30d supply, fill #0

## 2022-04-06 MED ORDER — FUROSEMIDE 20 MG PO TABS
20.0000 mg | ORAL_TABLET | Freq: Two times a day (BID) | ORAL | 0 refills | Status: AC
  Filled 2022-04-06: qty 10, 5d supply, fill #0

## 2022-04-06 NOTE — Progress Notes (Signed)
   04/06/22 1300  Departure Condition  Departure Condition Good  Mobility at [facility]  Patient/Caregiver Teaching Teach Back Method Used;Discharge instructions reviewed;Prescriptions reviewed;Follow-up care reviewed;Admission discussed  Departure Mode With family  Was procedural sedation performed on this patient during this visit? No   Pt alert and oriented x4, VS and pain stable

## 2022-04-06 NOTE — Plan of Care (Signed)
Problem: Health Behavior/Discharge Planning: Goal: Ability to manage health-related needs will improve Outcome: Adequate for Discharge   Problem: Clinical Measurements: Goal: Ability to maintain clinical measurements within normal limits will improve Outcome: Adequate for Discharge Goal: Will remain free from infection Outcome: Adequate for Discharge Goal: Diagnostic test results will improve Outcome: Adequate for Discharge Goal: Respiratory complications will improve Outcome: Adequate for Discharge Goal: Cardiovascular complication will be avoided Outcome: Adequate for Discharge   Problem: Activity: Goal: Risk for activity intolerance will decrease Outcome: Adequate for Discharge   Problem: Nutrition: Goal: Adequate nutrition will be maintained Outcome: Adequate for Discharge   Problem: Coping: Goal: Level of anxiety will decrease Outcome: Adequate for Discharge   Problem: Elimination: Goal: Will not experience complications related to bowel motility Outcome: Adequate for Discharge   Problem: Pain Managment: Goal: General experience of comfort will improve Outcome: Adequate for Discharge   Problem: Safety: Goal: Ability to remain free from injury will improve Outcome: Adequate for Discharge   Problem: Skin Integrity: Goal: Risk for impaired skin integrity will decrease Outcome: Adequate for Discharge   Problem: Education: Goal: Knowledge of disease or condition will improve Outcome: Adequate for Discharge Goal: Knowledge of the prescribed therapeutic regimen will improve Outcome: Adequate for Discharge   Problem: Fluid Volume: Goal: Peripheral tissue perfusion will improve Outcome: Adequate for Discharge   Problem: Clinical Measurements: Goal: Complications related to disease process, condition or treatment will be avoided or minimized Outcome: Adequate for Discharge   Problem: Coping: Goal: Ability to adjust to condition or change in health will  improve Outcome: Adequate for Discharge   Problem: Fluid Volume: Goal: Ability to maintain a balanced intake and output will improve Outcome: Adequate for Discharge   Problem: Health Behavior/Discharge Planning: Goal: Ability to identify and utilize available resources and services will improve Outcome: Adequate for Discharge Goal: Ability to manage health-related needs will improve Outcome: Adequate for Discharge   Problem: Metabolic: Goal: Ability to maintain appropriate glucose levels will improve Outcome: Adequate for Discharge   Problem: Nutritional: Goal: Progress toward achieving an optimal weight will improve Outcome: Adequate for Discharge   Problem: Skin Integrity: Goal: Risk for impaired skin integrity will decrease Outcome: Adequate for Discharge   Problem: Tissue Perfusion: Goal: Adequacy of tissue perfusion will improve Outcome: Adequate for Discharge   Problem: Education: Goal: Knowledge of the prescribed therapeutic regimen will improve Outcome: Adequate for Discharge Goal: Understanding of sexual limitations or changes related to disease process or condition will improve Outcome: Adequate for Discharge Goal: Individualized Educational Video(s) Outcome: Adequate for Discharge   Problem: Self-Concept: Goal: Communication of feelings regarding changes in body function or appearance will improve Outcome: Adequate for Discharge   Problem: Skin Integrity: Goal: Demonstration of wound healing without infection will improve Outcome: Adequate for Discharge   Problem: Education: Goal: Knowledge of condition will improve Outcome: Adequate for Discharge Goal: Individualized Educational Video(s) Outcome: Adequate for Discharge Goal: Individualized Newborn Educational Video(s) Outcome: Adequate for Discharge   Problem: Activity: Goal: Will verbalize the importance of balancing activity with adequate rest periods Outcome: Adequate for Discharge Goal: Ability  to tolerate increased activity will improve Outcome: Adequate for Discharge   Problem: Coping: Goal: Ability to identify and utilize available resources and services will improve Outcome: Adequate for Discharge   Problem: Life Cycle: Goal: Chance of risk for complications during the postpartum period will decrease Outcome: Adequate for Discharge   Problem: Skin Integrity: Goal: Demonstration of wound healing without infection will improve Outcome: Adequate for Discharge  Problem: Education: Goal: Knowledge of condition will improve Outcome: Adequate for Discharge Goal: Individualized Educational Video(s) Outcome: Adequate for Discharge Goal: Individualized Newborn Educational Video(s) Outcome: Adequate for Discharge   Problem: Coping: Goal: Ability to identify and utilize available resources and services will improve Outcome: Adequate for Discharge   Problem: Life Cycle: Goal: Chance of risk for complications during the postpartum period will decrease Outcome: Adequate for Discharge   Problem: Skin Integrity: Goal: Demonstration of wound healing without infection will improve Outcome: Adequate for Discharge

## 2022-04-08 ENCOUNTER — Telehealth: Payer: Self-pay | Admitting: *Deleted

## 2022-04-08 NOTE — Patient Outreach (Signed)
  Care Coordination TOC Note    Transitional Care Management Follow-up Telephone Call  Bridget Scott was discharged from [place] at [facility]), has or will have a pregnancy risk assessment at next visit, has a scheduled  Post Partum  appointment with the [facility] provider on 04/12/22 and 05/17/22, and has been referred for appropriate case management and ongoing follow up.   Lurena Joiner RN, BSN [place]  [employer] manager

## 2022-04-09 ENCOUNTER — Ambulatory Visit: Payer: Medicaid Other | Admitting: Cardiology

## 2022-04-09 ENCOUNTER — Telehealth (HOSPITAL_COMMUNITY): Payer: Self-pay | Admitting: *Deleted

## 2022-04-09 DIAGNOSIS — Z1331 Encounter for screening for depression: Secondary | ICD-10-CM

## 2022-04-09 NOTE — Telephone Encounter (Signed)
Inpatient EPDS=12. CSW consult completed during hospital stay. Today ambulatory IBH referral made. Dr. Gala Romney notified via chart.  Odis Hollingshead, RN 04-09-2022 at 10:00am

## 2022-04-12 ENCOUNTER — Ambulatory Visit: Payer: Medicaid Other

## 2022-04-12 ENCOUNTER — Ambulatory Visit (HOSPITAL_COMMUNITY): Payer: Self-pay

## 2022-04-12 NOTE — Lactation Note (Signed)
This note was copied from a baby's chart. Lactation Consultation Note  Patient Name: Bridget Scott [id number] Date: 04/12/2022 Age:41 days    LC attempted to visit with Mom of baby "Bridget Scott" in the NICU.  RN suggested I not visit at present due to critical condition of baby.  Mom has been sleeping on and off.  RN will ask Mom if she would like to see [place] when she can.   [name] 04/12/2022, 12:02 PM

## 2022-04-14 ENCOUNTER — Encounter: Payer: Medicaid Other | Admitting: Obstetrics and Gynecology

## 2022-04-16 ENCOUNTER — Telehealth: Payer: Self-pay | Admitting: Family Medicine

## 2022-04-16 NOTE — Telephone Encounter (Signed)
Returning call to patient.  She had left message for someone to call regarding questions she had about her tubal ligation.  Patient inquired if her tubes where tied or removed.  Reviewed OP note and notified patient of bilateral salpingectomy.   Patient is concerned.  She stated she specifically asked for them to be tied in the event she wanted to have them untied later to have more children.    Will forward concern to the appropriate department for follow up.

## 2022-04-19 ENCOUNTER — Ambulatory Visit (INDEPENDENT_AMBULATORY_CARE_PROVIDER_SITE_OTHER): Payer: Medicaid Other | Admitting: *Deleted

## 2022-04-19 VITALS — BP 175/109 | HR 61

## 2022-04-19 DIAGNOSIS — Z98891 History of uterine scar from previous surgery: Secondary | ICD-10-CM

## 2022-04-19 NOTE — Progress Notes (Signed)
Subjective:  Bridget Scott is a 42 y.o. female here for BP check and incision check.  Hypertension ROS: not taking medications regularly as instructed. Pt states it doesn't matter.  Pt reports incision has been oozing.  Objective:  BP (!) 175/109   Pulse 61   Appearance is sad and upset. Incision healing well, steri strips removed.   Assessment:   Blood Pressure today in office poorly controlled.  Incision healed nicely, no signs of infection, scar looks good.  Plan:  Wound care discussed. Talked with patient about the importance of taking her blood pressure medication as her blood pressure is very high and she is at risk to have a stroke.  Marland Kitchen  Pt wanted to talk more about why her tubes where removed and not tied. Stated I could not speak for the doctor that did her surgery.  Asked pt also if she was referred to a behavioral health provider, pt stated she was but did not want to do virtual. Informed pt that she and her partner could go to the [facility] and they both could have someone to talk with regarding the grief they are having due to the death of their son. Again mentioned about taking her blood pressure medication.   Medicaid high risk pregnancy care manager also came to see pt and offer their services and pt declined.    Dr Ilda Basset informed of pt declining to take BP meds and the discussion of [facility].   Crosby Oyster, RN

## 2022-04-26 NOTE — BH Specialist Note (Signed)
Integrated Behavioral Health via Telemedicine Visit  05/06/2022 Bridget Scott [id number]  Number of Integrated Behavioral Health Clinician visits: 1- Initial Visit  Session Start time: 1316   Session End time: 1409  Total time in minutes: 53   Referring Provider: Elsie Lincoln, MD Bridget/Family location: Home [facility] Provider location: Center for [facility] at [facility] for Women  All persons participating in visit: Bridget Scott and [facility]   Types of Service: Individual psychotherapy and Video visit  I connected with Bridget Scott and/or Bridget Scott's  n/a  via  Telephone or Video Enabled Telemedicine Application  (Video is Caregility application) and verified that I am speaking with the correct person using two identifiers. Discussed confidentiality: Yes   I discussed the limitations of telemedicine and the availability of in person appointments.  Discussed there is a possibility of technology failure and discussed alternative modes of communication if that failure occurs.  I discussed that engaging in this telemedicine visit, they consent to the provision of behavioral healthcare and the services will be billed under their insurance.  Bridget and/or legal guardian expressed understanding and consented to Telemedicine visit: Yes   Presenting Concerns: Bridget and/or family reports the following symptoms/concerns: Grieving loss of her son at 18 days old, followed by grief at having "tubes removed" rather than tied, preventing tubal reversal for future fertility.  Duration: Over three weeks ago  Bridget and/or Family's Strengths/Protective Factors: Sense of purpose  Goals Addressed: Bridget will:    Demonstrate ability to: Begin healthy grieving over loss  Progress towards Goals: Ongoing  Interventions: Interventions utilized:  Supportive Counseling and Link to [employer] Standardized Assessments completed: Not  Needed  Bridget and/or Family Response: Bridget agrees with treatment plan.   Assessment: Bridget currently experiencing Grief.   Bridget may benefit from therapeutic intervention regarding grief/loss.  Plan: Follow up with behavioral health clinician on : Two weeks Behavioral recommendations:  -Continue prioritizing healthy self-care (regular meals, adequate rest; allowing practical help from supportive friends and family) until at least postpartum medical appointment -Consider Loss Support group as needed at either [email or web address] or [email or web address]   Referral(s): Integrated Art gallery manager (In Clinic) and [employer]:  Loss support  I discussed the assessment and treatment plan with the Bridget and/or parent/guardian. They were provided an opportunity to ask questions and all were answered. They agreed with the plan and demonstrated an understanding of the instructions.   They were advised to call back or seek an in-person evaluation if the symptoms worsen or if the condition fails to improve as anticipated.  Valetta Close Bradd Merlos, LCSW

## 2022-05-01 ENCOUNTER — Other Ambulatory Visit: Payer: Self-pay | Admitting: Obstetrics & Gynecology

## 2022-05-01 ENCOUNTER — Other Ambulatory Visit: Payer: Self-pay | Admitting: Obstetrics and Gynecology

## 2022-05-01 ENCOUNTER — Other Ambulatory Visit: Payer: Self-pay | Admitting: Family Medicine

## 2022-05-01 DIAGNOSIS — E119 Type 2 diabetes mellitus without complications: Secondary | ICD-10-CM

## 2022-05-06 ENCOUNTER — Other Ambulatory Visit (HOSPITAL_COMMUNITY): Payer: Self-pay

## 2022-05-06 ENCOUNTER — Other Ambulatory Visit: Payer: Self-pay

## 2022-05-06 ENCOUNTER — Ambulatory Visit (INDEPENDENT_AMBULATORY_CARE_PROVIDER_SITE_OTHER): Payer: Medicaid Other | Admitting: Clinical

## 2022-05-06 DIAGNOSIS — F4321 Adjustment disorder with depressed mood: Secondary | ICD-10-CM | POA: Diagnosis not present

## 2022-05-06 NOTE — Patient Instructions (Signed)
Center for [facility] Healthcare at [facility] for Women [address] [place], [place] [address] [phone] (main office) [phone] (Loreda Silverio's office)  Loss Support Group [email or web address]  [facility] (Individual and group grief support) [email or web address]  [phone]

## 2022-05-07 ENCOUNTER — Encounter (HOSPITAL_COMMUNITY): Payer: Self-pay

## 2022-05-07 ENCOUNTER — Other Ambulatory Visit (HOSPITAL_COMMUNITY): Payer: Self-pay

## 2022-05-10 NOTE — BH Specialist Note (Signed)
Integrated Behavioral Health via Telemedicine Visit  05/20/2022 Bridget Scott [id number]  Number of Integrated Behavioral Health Clinician visits: 2- Second Visit  Session Start time: 1608   Session End time: 1644  Total time in minutes: 36   Referring Provider: Elsie Lincoln, MD Patient/Family location: Home [facility] Provider location: [facility] for [facility] at [facility]  All persons participating in visit: Patient Bridget Scott and Bridget Scott   Types of Service: Individual psychotherapy and Video visit  I connected with Bridget Scott and/or Bridget Scott's  n/a  via  Telephone or Video Enabled Telemedicine Application  (Video is Caregility application) and verified that I am speaking with the correct person using two identifiers. Discussed confidentiality: Yes   I discussed the limitations of telemedicine and the availability of in person appointments.  Discussed there is a possibility of technology failure and discussed alternative modes of communication if that failure occurs I discussed that engaging in this telemedicine visit, they consent to the provision of behavioral healthcare and the services will be billed under their insurance.  Patient and/or legal guardian expressed understanding and consented to Telemedicine visit: Yes   Presenting Concerns: Patient and/or family reports the following symptoms/concerns: Grieving loss of son; will begin IVF treatments soon. SI with no intent or plan of harm to self; 4yo daughter is greatest joy for pt and her husband.   Patient and/or Family's Strengths/Protective Factors: Social connections, Concrete supports in place (healthy food, safe environments, etc.), and Sense of purpose  Goals Addressed: Patient will:  Reduce symptoms of: depression, anxiety    Demonstrate ability to: Increase motivation to adhere to plan of care and Begin healthy grieving over loss  Progress towards  Goals: Ongoing  Interventions: Interventions utilized:  Link to [employer] and Supportive Reflection Standardized Assessments completed: C-SSRS Short, GAD-7, and PHQ 9  Patient and/or Family Response: Patient agrees with treatment plan.   Assessment: Patient currently experiencing Grief; Major depressive disorder, recurrent, severe, without psychotic features.   Patient may benefit from continued therapeutic intervention.  Plan: Follow up with behavioral health clinician on : Two weeks Behavioral recommendations:  -Continue taking BH medication as prescribed; discuss with PCP any changes at upcoming medical visit next week -Continue plan to begin IVF at [facility] -Continue prioritizing healthy self-care (regular meals, adequate rest; allowing practical help from supportive friends and family) until at least postpartum medical appointment -Consider Infertility  support group as needed at [email or web address]  Referral(s): Integrated Art gallery manager (In Clinic) and [facility]:  IVF/Infertility support  I discussed the assessment and treatment plan with the patient and/or parent/guardian. They were provided an opportunity to ask questions and all were answered. They agreed with the plan and demonstrated an understanding of the instructions.   They were advised to call back or seek an in-person evaluation if the symptoms worsen or if the condition fails to improve as anticipated.  Bridget Close Deion Swift, LCSW     05/20/2022    4:23 PM 01/20/2021    1:41 PM 01/06/2021    1:28 PM  Depression screen PHQ 2/9  Decreased Interest Down, Depressed, Hopeless PHQ - 2 Score Altered sleeping Tired, decreased energy Change in appetite Feeling bad or failure about yourself  Trouble concentrating 3 0 0  Moving slowly or fidgety/restless 0  0 0  Suicidal thoughts PHQ-9 Score Difficult doing  work/chores  Very difficult Somewhat difficult      05/20/2022    4:25 PM  GAD 7 : Generalized Anxiety Score  Nervous, Anxious, on Edge 3  Control/stop worrying 3  Worry too much - different things 3  Trouble relaxing 3  Restless 0  Easily annoyed or irritable 3  Afraid - awful might happen 3  Total GAD 7 Score 18

## 2022-05-11 ENCOUNTER — Other Ambulatory Visit: Payer: Self-pay

## 2022-05-11 ENCOUNTER — Other Ambulatory Visit (HOSPITAL_COMMUNITY): Payer: Self-pay

## 2022-05-17 ENCOUNTER — Ambulatory Visit: Payer: Medicaid Other | Admitting: Family Medicine

## 2022-05-17 ENCOUNTER — Encounter: Payer: Self-pay | Admitting: Family Medicine

## 2022-05-17 NOTE — Progress Notes (Signed)
Patient did not keep appointment today. She will be called to reschedule.  

## 2022-05-20 ENCOUNTER — Ambulatory Visit (INDEPENDENT_AMBULATORY_CARE_PROVIDER_SITE_OTHER): Payer: Medicaid Other | Admitting: Clinical

## 2022-05-20 DIAGNOSIS — F4321 Adjustment disorder with depressed mood: Secondary | ICD-10-CM

## 2022-05-20 DIAGNOSIS — F332 Major depressive disorder, recurrent severe without psychotic features: Secondary | ICD-10-CM

## 2022-05-20 NOTE — Patient Instructions (Signed)
[facility] [address] [place], [place] [address] [phone] (main office) [phone] (Kylen Schliep's office)   

## 2022-05-21 NOTE — BH Specialist Note (Signed)
Integrated Behavioral Health via Telemedicine Visit  06/03/2022 [name] [id number]  Number of Integrated Behavioral Health Clinician visits: 3- Third Visit  Session Start time: 1525   Session End time: 1543  Total time in minutes: 18   Referring Provider: Elsie Lincoln, MD Patient/Family location: Home [facility] Provider location: Center for [facility] at [facility] for [facility]  All persons participating in visit: Patient Bridget Scott and [facility] Leslye Puccini   Types of Service: Individual psychotherapy and Video visit  I connected with [name] and/or Glenetta Borg Bott's  n/a  via  Telephone or Video Enabled Telemedicine Application  (Video is Caregility application) and verified that I am speaking with the correct person using two identifiers. Discussed confidentiality: Yes   I discussed the limitations of telemedicine and the availability of in person appointments.  Discussed there is a possibility of technology failure and discussed alternative modes of communication if that failure occurs.  I discussed that engaging in this telemedicine visit, they consent to the provision of behavioral healthcare and the services will be billed under their insurance.  Patient and/or legal guardian expressed understanding and consented to Telemedicine visit: Yes   Presenting Concerns: Patient and/or family reports the following symptoms/concerns: Continued grieving loss of son; joined support groups for extra support.    Patient and/or Family's Strengths/Protective Factors: Social connections, Concrete supports in place (healthy food, safe environments, etc.), and Sense of purpose  Goals Addressed: Patient will:    Demonstrate ability to:  continue healthy grieving over loss  Progress towards Goals: Ongoing  Interventions: Interventions utilized:  Supportive Reflection Standardized Assessments completed: GAD-7 and PHQ 9  Patient and/or Family Response:  Patient agrees with treatment plan.   Assessment: Patient currently experiencing Grief and Major depressive disorder, recurrent, severe, without psychotic features  Patient may benefit from continued therapeutic intervention.  Plan: Follow up with behavioral health clinician on : Two weeks Behavioral recommendations:  -Continue taking BH medications as prescribed by PCP -Continue attending online support groups (IVF support; infant loss) -Continue plan to attend upcoming appointment at [facility] (07/09/22) -Continue making plans for summer trips with family Referral(s): Integrated [employer] (In Clinic)  I discussed the assessment and treatment plan with the patient and/or parent/guardian. They were provided an opportunity to ask questions and all were answered. They agreed with the plan and demonstrated an understanding of the instructions.   They were advised to call back or seek an in-person evaluation if the symptoms worsen or if the condition fails to improve as anticipated.  Rae Lips, LCSW     06/03/2022    3:42 PM 05/20/2022    4:23 PM 01/20/2021    1:41 PM 01/06/2021    1:28 PM  Depression screen PHQ 2/9  Decreased Interest 1 2 3 3   Down, Depressed, Hopeless 3 3 3 3   PHQ - 2 Score 4 5 6 6   Altered sleeping 3 3 3 3   Tired, decreased energy 1 3 3 3   Change in appetite 0 3 1 3   Feeling bad or failure about yourself  1 3 3 3   Trouble concentrating 1 3 0 0  Moving slowly or fidgety/restless 0 0 0 0  Suicidal thoughts 0 3 1 1   PHQ-9 Score 10 23 17 19   Difficult doing work/chores   Very difficult Somewhat difficult      06/03/2022    3:44 PM 05/20/2022    4:25 PM  GAD 7 : Generalized Anxiety Score  Nervous, Anxious, on Edge 1 3  Control/stop worrying 1 3  Worry too much - different things 1 3  Trouble relaxing 1 3  Restless 1 0  Easily annoyed or irritable 1 3  Afraid - awful might happen 3 3  Total GAD 7 Score 9 18

## 2022-05-28 ENCOUNTER — Other Ambulatory Visit (HOSPITAL_COMMUNITY): Payer: Self-pay

## 2022-06-01 ENCOUNTER — Other Ambulatory Visit (HOSPITAL_COMMUNITY): Payer: Self-pay

## 2022-06-02 ENCOUNTER — Other Ambulatory Visit (HOSPITAL_COMMUNITY): Payer: Self-pay

## 2022-06-03 ENCOUNTER — Ambulatory Visit (INDEPENDENT_AMBULATORY_CARE_PROVIDER_SITE_OTHER): Payer: Medicaid Other | Admitting: Clinical

## 2022-06-03 DIAGNOSIS — F4321 Adjustment disorder with depressed mood: Secondary | ICD-10-CM | POA: Diagnosis not present

## 2022-06-03 DIAGNOSIS — F332 Major depressive disorder, recurrent severe without psychotic features: Secondary | ICD-10-CM

## 2022-06-04 NOTE — BH Specialist Note (Signed)
Pt did not arrive to video visit and did not answer the phone; Left HIPPA-compliant message to call back Tenna Lacko from [facility] at  [phone] (Aalyssa Elderkin's office).  ?; left MyChart message for patient.  ? ?

## 2022-06-07 DIAGNOSIS — F419 Anxiety disorder, unspecified: Secondary | ICD-10-CM | POA: Diagnosis not present

## 2022-06-07 DIAGNOSIS — Z794 Long term (current) use of insulin: Secondary | ICD-10-CM | POA: Diagnosis not present

## 2022-06-07 DIAGNOSIS — F410 Panic disorder [episodic paroxysmal anxiety] without agoraphobia: Secondary | ICD-10-CM | POA: Diagnosis not present

## 2022-06-07 DIAGNOSIS — E119 Type 2 diabetes mellitus without complications: Secondary | ICD-10-CM | POA: Diagnosis not present

## 2022-06-07 DIAGNOSIS — O8600 Infection of obstetric surgical wound, unspecified: Secondary | ICD-10-CM | POA: Diagnosis not present

## 2022-06-08 ENCOUNTER — Other Ambulatory Visit: Payer: Self-pay | Admitting: Obstetrics & Gynecology

## 2022-06-08 ENCOUNTER — Other Ambulatory Visit (HOSPITAL_COMMUNITY): Payer: Self-pay

## 2022-06-08 DIAGNOSIS — E119 Type 2 diabetes mellitus without complications: Secondary | ICD-10-CM

## 2022-06-18 ENCOUNTER — Ambulatory Visit: Payer: Medicaid Other | Admitting: Clinical

## 2022-06-18 DIAGNOSIS — Z91199 Patient's noncompliance with other medical treatment and regimen due to unspecified reason: Secondary | ICD-10-CM

## 2022-06-24 ENCOUNTER — Other Ambulatory Visit: Payer: Self-pay | Admitting: Obstetrics and Gynecology

## 2022-08-10 DIAGNOSIS — J4521 Mild intermittent asthma with (acute) exacerbation: Secondary | ICD-10-CM | POA: Diagnosis not present

## 2022-08-10 DIAGNOSIS — J208 Acute bronchitis due to other specified organisms: Secondary | ICD-10-CM | POA: Diagnosis not present

## 2022-08-10 DIAGNOSIS — R051 Acute cough: Secondary | ICD-10-CM | POA: Diagnosis not present

## 2022-08-10 DIAGNOSIS — B9689 Other specified bacterial agents as the cause of diseases classified elsewhere: Secondary | ICD-10-CM | POA: Diagnosis not present

## 2022-08-19 ENCOUNTER — Other Ambulatory Visit (HOSPITAL_COMMUNITY): Payer: Self-pay

## 2022-10-23 DIAGNOSIS — J0100 Acute maxillary sinusitis, unspecified: Secondary | ICD-10-CM | POA: Diagnosis not present

## 2023-02-16 ENCOUNTER — Encounter: Payer: Self-pay | Admitting: Emergency Medicine

## 2023-02-16 ENCOUNTER — Emergency Department
Admission: EM | Admit: 2023-02-16 | Discharge: 2023-02-16 | Disposition: A | Discharge status: Home / Self Care | Payer: Medicaid Other | Attending: Emergency Medicine | Admitting: Emergency Medicine

## 2023-02-16 ENCOUNTER — Emergency Department: Payer: Medicaid Other

## 2023-02-16 ENCOUNTER — Other Ambulatory Visit: Payer: Self-pay

## 2023-02-16 DIAGNOSIS — S61212A Laceration without foreign body of right middle finger without damage to nail, initial encounter: Secondary | ICD-10-CM | POA: Diagnosis not present

## 2023-02-16 DIAGNOSIS — W268XXA Contact with other sharp object(s), not elsewhere classified, initial encounter: Secondary | ICD-10-CM | POA: Diagnosis not present

## 2023-02-16 DIAGNOSIS — Y93G1 Activity, food preparation and clean up: Secondary | ICD-10-CM | POA: Diagnosis not present

## 2023-02-16 DIAGNOSIS — S6991XA Unspecified injury of right wrist, hand and finger(s), initial encounter: Secondary | ICD-10-CM | POA: Diagnosis present

## 2023-02-16 NOTE — ED Triage Notes (Signed)
Patient to ED via POV for finger laceration. Right middle finger- states she cut the tip off while cutting veggies. Bleeding controlled at this time.

## 2023-02-16 NOTE — ED Provider Triage Note (Signed)
Emergency Medicine Provider Triage Evaluation Note  Bridget Scott , a 43 y.o. female  was evaluated in triage.  Pt complains of laceration to the tip of the right middle finger. She was using a mandolin slicer and misjudged the depth of the veggie and cut the fingertip off. Bleeding controlled with pressure.  Physical Exam  There were no vitals taken for this visit. Gen:   Awake, no distress   Resp:  Normal effort  MSK:   Moves extremities without difficulty  Other:    Medical Decision Making  Medically screening exam initiated at 6:47 PM.  Appropriate orders placed.  Bridget Scott was informed that the remainder of the evaluation will be completed by another provider, this initial triage assessment does not replace that evaluation, and the importance of remaining in the ED until their evaluation is complete.    Chinita Pester, [name] 02/16/23 1849

## 2023-02-16 NOTE — ED Provider Notes (Signed)
[facility] Provider Note    Event Date/Time   First MD Initiated Contact with Patient 02/16/23 2008     (approximate)   History   Finger Injury   HPI  Bridget Scott is a 43 y.o. female who presents with complaints of finger injury.  Patient reports she was using a slicer and accidentally cut the tip of her right index finger.  Tetanus up-to-date     Physical Exam   Triage Vital Signs: ED Triage Vitals  Encounter Vitals Group     BP 02/16/23 1848 (!) 207/114     Systolic BP Percentile --      Diastolic BP Percentile --      Pulse Rate 02/16/23 1848 78     Resp 02/16/23 1848 18     Temp 02/16/23 1848 98 F (36.7 C)     Temp Source 02/16/23 1848 Oral     SpO2 02/16/23 1848 95 %     Weight 02/16/23 1847 85.7 kg (189 lb)     Height 02/16/23 1847 1.651 m (5\' 5" )     Head Circumference --      Peak Flow --      Pain Score 02/16/23 1847 8     Pain Loc --      Pain Education --      Exclude from Growth Chart --     Most recent vital signs: Vitals:   02/16/23 1848  BP: (!) 207/114  Pulse: 78  Resp: 18  Temp: 98 F (36.7 C)  SpO2: 95%     General: Awake, no distress.  CV:  Good peripheral perfusion.  Resp:  Normal effort.  Abd:  No distention.  Other:  Distal tip of finger missing, nailbed intact, small mount of bleeding   ED Results / Procedures / Treatments   Labs (all labs ordered are listed, but only abnormal results are displayed) Labs Reviewed - No data to display   EKG     RADIOLOGY     PROCEDURES:  Critical Care performed:   .Laceration Repair  Date/Time: 02/16/2023 8:51 PM  Performed by: Jene Every, MD Authorized by: Jene Every, MD   Consent:    Consent obtained:  Verbal   Consent given by:  Patient   Risks discussed:  Pain and need for additional repair Anesthesia:    Anesthesia method:  None Laceration details:    Length (cm):  1 Exploration:    Hemostasis achieved with:   Tourniquet Skin repair:    Repair method:  Tissue adhesive Repair type:    Repair type:  Simple Post-procedure details:    Dressing:  Adhesive bandage   Procedure completion:  Tolerated    MEDICATIONS ORDERED IN ED: Medications - No data to display   IMPRESSION / MDM / ASSESSMENT AND PLAN / ED COURSE  I reviewed the triage vital signs and the nursing notes. Patient's presentation is most consistent with acute complicated illness / injury requiring diagnostic workup.  Patient presents after laceration of the tip of her finger, small amount of distal tip of finger missing, venous bleeding.  Nailbed intact.  Tourniquet applied tissue adhesive used, bleeding controlled, dressing applied        FINAL CLINICAL IMPRESSION(S) / ED DIAGNOSES   Final diagnoses:  Laceration of right middle finger without foreign body without damage to nail, initial encounter     Rx / DC Orders   ED Discharge Orders     None  Note:  This document was prepared using Dragon voice recognition software and may include unintentional dictation errors.   Jene Every, MD 02/16/23 2052

## 2023-03-07 DIAGNOSIS — F410 Panic disorder [episodic paroxysmal anxiety] without agoraphobia: Secondary | ICD-10-CM | POA: Diagnosis not present

## 2023-03-07 DIAGNOSIS — M5090 Cervical disc disorder, unspecified, unspecified cervical region: Secondary | ICD-10-CM | POA: Diagnosis not present

## 2023-03-07 DIAGNOSIS — R45851 Suicidal ideations: Secondary | ICD-10-CM | POA: Diagnosis not present

## 2023-03-07 DIAGNOSIS — F32A Depression, unspecified: Secondary | ICD-10-CM | POA: Diagnosis not present

## 2023-03-07 DIAGNOSIS — F419 Anxiety disorder, unspecified: Secondary | ICD-10-CM | POA: Diagnosis not present

## 2023-03-07 DIAGNOSIS — E119 Type 2 diabetes mellitus without complications: Secondary | ICD-10-CM | POA: Diagnosis not present

## 2023-05-14 ENCOUNTER — Other Ambulatory Visit: Payer: Self-pay | Admitting: Obstetrics & Gynecology

## 2023-05-14 DIAGNOSIS — O0992 Supervision of high risk pregnancy, unspecified, second trimester: Secondary | ICD-10-CM

## 2023-07-04 DIAGNOSIS — F419 Anxiety disorder, unspecified: Secondary | ICD-10-CM | POA: Diagnosis not present

## 2023-07-04 DIAGNOSIS — G44209 Tension-type headache, unspecified, not intractable: Secondary | ICD-10-CM | POA: Diagnosis not present

## 2023-07-04 DIAGNOSIS — Z794 Long term (current) use of insulin: Secondary | ICD-10-CM | POA: Diagnosis not present

## 2023-07-04 DIAGNOSIS — F410 Panic disorder [episodic paroxysmal anxiety] without agoraphobia: Secondary | ICD-10-CM | POA: Diagnosis not present

## 2023-07-04 DIAGNOSIS — E119 Type 2 diabetes mellitus without complications: Secondary | ICD-10-CM | POA: Diagnosis not present

## 2023-07-04 DIAGNOSIS — N915 Oligomenorrhea, unspecified: Secondary | ICD-10-CM | POA: Diagnosis not present

## 2023-09-05 DIAGNOSIS — I10 Essential (primary) hypertension: Secondary | ICD-10-CM | POA: Diagnosis not present

## 2023-09-05 DIAGNOSIS — G44209 Tension-type headache, unspecified, not intractable: Secondary | ICD-10-CM | POA: Diagnosis not present

## 2023-09-05 DIAGNOSIS — R45851 Suicidal ideations: Secondary | ICD-10-CM | POA: Diagnosis not present

## 2023-09-05 DIAGNOSIS — F332 Major depressive disorder, recurrent severe without psychotic features: Secondary | ICD-10-CM | POA: Diagnosis not present

## 2023-09-05 DIAGNOSIS — Z23 Encounter for immunization: Secondary | ICD-10-CM | POA: Diagnosis not present

## 2023-09-05 DIAGNOSIS — Z1231 Encounter for screening mammogram for malignant neoplasm of breast: Secondary | ICD-10-CM | POA: Diagnosis not present

## 2023-09-13 DIAGNOSIS — F411 Generalized anxiety disorder: Secondary | ICD-10-CM | POA: Diagnosis not present

## 2023-10-17 DIAGNOSIS — J019 Acute sinusitis, unspecified: Secondary | ICD-10-CM | POA: Diagnosis not present

## 2023-10-17 DIAGNOSIS — F332 Major depressive disorder, recurrent severe without psychotic features: Secondary | ICD-10-CM | POA: Diagnosis not present

## 2023-10-17 DIAGNOSIS — B9689 Other specified bacterial agents as the cause of diseases classified elsewhere: Secondary | ICD-10-CM | POA: Diagnosis not present

## 2023-11-08 DIAGNOSIS — M7989 Other specified soft tissue disorders: Secondary | ICD-10-CM | POA: Diagnosis not present

## 2023-11-08 DIAGNOSIS — S9032XA Contusion of left foot, initial encounter: Secondary | ICD-10-CM | POA: Diagnosis not present

## 2023-11-15 DIAGNOSIS — S9032XA Contusion of left foot, initial encounter: Secondary | ICD-10-CM | POA: Diagnosis not present

## 2023-11-15 DIAGNOSIS — E1140 Type 2 diabetes mellitus with diabetic neuropathy, unspecified: Secondary | ICD-10-CM | POA: Diagnosis not present

## 2023-11-15 DIAGNOSIS — G5792 Unspecified mononeuropathy of left lower limb: Secondary | ICD-10-CM | POA: Diagnosis not present

## 2023-12-09 DIAGNOSIS — F332 Major depressive disorder, recurrent severe without psychotic features: Secondary | ICD-10-CM | POA: Diagnosis not present

## 2023-12-09 DIAGNOSIS — E1129 Type 2 diabetes mellitus with other diabetic kidney complication: Secondary | ICD-10-CM | POA: Diagnosis not present

## 2023-12-09 DIAGNOSIS — Z3200 Encounter for pregnancy test, result unknown: Secondary | ICD-10-CM | POA: Diagnosis not present

## 2023-12-09 DIAGNOSIS — R809 Proteinuria, unspecified: Secondary | ICD-10-CM | POA: Diagnosis not present

## 2023-12-09 DIAGNOSIS — I160 Hypertensive urgency: Secondary | ICD-10-CM | POA: Diagnosis not present

## 2023-12-09 DIAGNOSIS — Z720 Tobacco use: Secondary | ICD-10-CM | POA: Diagnosis not present
